# Patient Record
Sex: Male | Born: 1950 | Race: White | Hispanic: No | Marital: Married | Smoking: Never smoker
Health system: Southern US, Community
[De-identification: ages and names within clinical notes are randomized; demographics above are authoritative.]

## PROBLEM LIST (undated history)

## (undated) DIAGNOSIS — K760 Fatty (change of) liver, not elsewhere classified: Secondary | ICD-10-CM

## (undated) DIAGNOSIS — I10 Essential (primary) hypertension: Secondary | ICD-10-CM

## (undated) DIAGNOSIS — E785 Hyperlipidemia, unspecified: Secondary | ICD-10-CM

## (undated) DIAGNOSIS — E119 Type 2 diabetes mellitus without complications: Secondary | ICD-10-CM

## (undated) HISTORY — DX: Type 2 diabetes mellitus without complications: E11.9

## (undated) HISTORY — DX: Hyperlipidemia, unspecified: E78.5

## (undated) HISTORY — DX: Fatty (change of) liver, not elsewhere classified: K76.0

---

## 1999-04-17 ENCOUNTER — Ambulatory Visit (HOSPITAL_BASED_OUTPATIENT_CLINIC_OR_DEPARTMENT_OTHER): Admission: RE | Admit: 1999-04-17 | Discharge: 1999-04-17 | Payer: Self-pay | Admitting: *Deleted

## 1999-05-20 ENCOUNTER — Ambulatory Visit (HOSPITAL_COMMUNITY): Admission: RE | Admit: 1999-05-20 | Discharge: 1999-05-20 | Payer: Self-pay | Admitting: Neurosurgery

## 1999-05-20 ENCOUNTER — Encounter: Payer: Self-pay | Admitting: Neurosurgery

## 2000-08-25 ENCOUNTER — Emergency Department (HOSPITAL_COMMUNITY): Admission: EM | Admit: 2000-08-25 | Discharge: 2000-08-25 | Payer: Self-pay

## 2008-10-05 DIAGNOSIS — I10 Essential (primary) hypertension: Secondary | ICD-10-CM

## 2008-10-05 HISTORY — DX: Essential (primary) hypertension: I10

## 2012-04-01 ENCOUNTER — Encounter (HOSPITAL_COMMUNITY): Payer: Self-pay | Admitting: Emergency Medicine

## 2012-04-01 ENCOUNTER — Emergency Department (HOSPITAL_COMMUNITY)
Admission: EM | Admit: 2012-04-01 | Discharge: 2012-04-01 | Disposition: A | Discharge status: Home / Self Care | Payer: No Typology Code available for payment source | Attending: Emergency Medicine | Admitting: Emergency Medicine

## 2012-04-01 ENCOUNTER — Emergency Department (HOSPITAL_COMMUNITY): Payer: No Typology Code available for payment source

## 2012-04-01 DIAGNOSIS — S335XXA Sprain of ligaments of lumbar spine, initial encounter: Secondary | ICD-10-CM | POA: Insufficient documentation

## 2012-04-01 DIAGNOSIS — I10 Essential (primary) hypertension: Secondary | ICD-10-CM | POA: Insufficient documentation

## 2012-04-01 DIAGNOSIS — IMO0002 Reserved for concepts with insufficient information to code with codable children: Secondary | ICD-10-CM | POA: Insufficient documentation

## 2012-04-01 DIAGNOSIS — S39012A Strain of muscle, fascia and tendon of lower back, initial encounter: Secondary | ICD-10-CM

## 2012-04-01 DIAGNOSIS — S46911A Strain of unspecified muscle, fascia and tendon at shoulder and upper arm level, right arm, initial encounter: Secondary | ICD-10-CM

## 2012-04-01 DIAGNOSIS — Z79899 Other long term (current) drug therapy: Secondary | ICD-10-CM | POA: Insufficient documentation

## 2012-04-01 DIAGNOSIS — M25519 Pain in unspecified shoulder: Secondary | ICD-10-CM | POA: Insufficient documentation

## 2012-04-01 HISTORY — DX: Essential (primary) hypertension: I10

## 2012-04-01 NOTE — ED Notes (Signed)
Pt states lower back is "just stiff, like I slept too much." Pt concerned with right shoulder. Full ROM visualized. Pt c.o pain inc with movement.

## 2012-04-01 NOTE — ED Notes (Signed)
Pt d/c home in NAD. Pt voiced understanding of d/c instructions and follow up care.  

## 2012-04-01 NOTE — Discharge Instructions (Signed)
SEEK IMMEDIATE MEDICAL ATTENTION IF: You develop difficulties swallowing or breathing.  You have new or worse numbness, weakness, tingling, or movement problems in your arms or legs.  You develop increasing pain which is uncontrolled with medications.  You have change in bowel or bladder function, or other concerns.  SEEK IMMEDIATE MEDICAL ATTENTION IF: New numbness, tingling, weakness, or problem with the use of your arms or legs.  Severe back pain not relieved with medications.  Change in bowel or bladder control.  Increasing pain in any areas of the body (such as chest or abdominal pain).  Shortness of breath, dizziness or fainting.  Nausea (feeling sick to your stomach), vomiting, fever, or sweats.
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
## 2015-10-06 DIAGNOSIS — E785 Hyperlipidemia, unspecified: Secondary | ICD-10-CM

## 2015-10-06 DIAGNOSIS — E119 Type 2 diabetes mellitus without complications: Secondary | ICD-10-CM

## 2015-10-06 HISTORY — DX: Type 2 diabetes mellitus without complications: E11.9

## 2015-10-06 HISTORY — DX: Hyperlipidemia, unspecified: E78.5

## 2016-12-24 ENCOUNTER — Encounter: Payer: Self-pay | Admitting: Internal Medicine

## 2016-12-24 ENCOUNTER — Ambulatory Visit (INDEPENDENT_AMBULATORY_CARE_PROVIDER_SITE_OTHER): Payer: Medicare HMO | Admitting: Internal Medicine

## 2016-12-24 VITALS — BP 134/76 | HR 82 | Temp 98.6°F | Resp 18 | Ht 74.0 in | Wt 275.0 lb

## 2016-12-24 DIAGNOSIS — Z79899 Other long term (current) drug therapy: Secondary | ICD-10-CM

## 2016-12-24 DIAGNOSIS — E119 Type 2 diabetes mellitus without complications: Secondary | ICD-10-CM | POA: Diagnosis not present

## 2016-12-24 DIAGNOSIS — Z0001 Encounter for general adult medical examination with abnormal findings: Secondary | ICD-10-CM

## 2016-12-24 DIAGNOSIS — I10 Essential (primary) hypertension: Secondary | ICD-10-CM

## 2016-12-24 DIAGNOSIS — Z125 Encounter for screening for malignant neoplasm of prostate: Secondary | ICD-10-CM

## 2016-12-24 DIAGNOSIS — Z0000 Encounter for general adult medical examination without abnormal findings: Secondary | ICD-10-CM

## 2016-12-24 DIAGNOSIS — E559 Vitamin D deficiency, unspecified: Secondary | ICD-10-CM

## 2016-12-24 DIAGNOSIS — E785 Hyperlipidemia, unspecified: Secondary | ICD-10-CM

## 2016-12-24 LAB — HEPATIC FUNCTION PANEL
ALK PHOS: 49 U/L (ref 40–115)
ALT: 65 U/L — AB (ref 9–46)
AST: 42 U/L — AB (ref 10–35)
Albumin: 4.7 g/dL (ref 3.6–5.1)
BILIRUBIN DIRECT: 0.1 mg/dL (ref <=0.2)
BILIRUBIN TOTAL: 0.4 mg/dL (ref 0.2–1.2)
Indirect Bilirubin: 0.3 mg/dL (ref 0.2–1.2)
Total Protein: 7.7 g/dL (ref 6.1–8.1)

## 2016-12-24 LAB — LIPID PANEL
CHOL/HDL RATIO: 7.6 ratio — AB (ref <5.0)
Cholesterol: 243 mg/dL — ABNORMAL HIGH (ref <200)
HDL: 32 mg/dL — AB (ref >40)
LDL CALC: 138 mg/dL — AB (ref <100)
Triglycerides: 363 mg/dL — ABNORMAL HIGH (ref <150)
VLDL: 73 mg/dL — ABNORMAL HIGH (ref <30)

## 2016-12-24 LAB — CBC WITH DIFFERENTIAL/PLATELET
BASOS ABS: 78 {cells}/uL (ref 0–200)
BASOS PCT: 1 %
EOS ABS: 78 {cells}/uL (ref 15–500)
Eosinophils Relative: 1 %
HCT: 42.2 % (ref 38.5–50.0)
HEMOGLOBIN: 14.2 g/dL (ref 13.2–17.1)
LYMPHS ABS: 2106 {cells}/uL (ref 850–3900)
Lymphocytes Relative: 27 %
MCH: 31.8 pg (ref 27.0–33.0)
MCHC: 33.6 g/dL (ref 32.0–36.0)
MCV: 94.6 fL (ref 80.0–100.0)
MPV: 10.3 fL (ref 7.5–12.5)
Monocytes Absolute: 702 cells/uL (ref 200–950)
Monocytes Relative: 9 %
Neutro Abs: 4836 cells/uL (ref 1500–7800)
Neutrophils Relative %: 62 %
Platelets: 303 10*3/uL (ref 140–400)
RBC: 4.46 MIL/uL (ref 4.20–5.80)
RDW: 13 % (ref 11.0–15.0)
WBC: 7.8 10*3/uL (ref 3.8–10.8)

## 2016-12-24 LAB — BASIC METABOLIC PANEL WITH GFR
BUN: 17 mg/dL (ref 7–25)
CHLORIDE: 103 mmol/L (ref 98–110)
CO2: 22 mmol/L (ref 20–31)
Calcium: 10 mg/dL (ref 8.6–10.3)
Creat: 0.9 mg/dL (ref 0.70–1.25)
GFR, Est African American: >89 mL/min (ref >=60)
GFR, Est Non African American: 89 mL/min (ref >=60)
Glucose, Bld: 92 mg/dL (ref 65–99)
POTASSIUM: 4.6 mmol/L (ref 3.5–5.3)
SODIUM: 139 mmol/L (ref 135–146)

## 2016-12-24 LAB — HEMOGLOBIN A1C
Hgb A1c MFr Bld: 5.4 % (ref <5.7)
MEAN PLASMA GLUCOSE: 108 mg/dL

## 2016-12-24 LAB — TSH: TSH: 2.14 m[IU]/L (ref 0.40–4.50)

## 2016-12-24 LAB — PSA: PSA: 1.5 ng/mL (ref <=4.0)

## 2016-12-24 MED ORDER — LISINOPRIL-HYDROCHLOROTHIAZIDE 20-25 MG PO TABS: 1.0000 | ORAL_TABLET | Freq: Every day | ORAL | 0 refills | Status: DC

## 2016-12-24 MED ORDER — TRAZODONE HCL 100 MG PO TABS: 100.0000 mg | ORAL_TABLET | Freq: Every day | ORAL | 0 refills | Status: DC

## 2016-12-24 NOTE — Patient Instructions (Signed)
Please start taking trazodone 1/2 tablet about 45 minutes prior to bedtime.  If you are still not sleepy you can take the second half of the tablet when you get in bed.  Please continue your metformin.  Please stop the spironolocatone.  Please stop the lisinopril 20 mg.  Please start taking lisnopril HCTZ 20-25 mg tablet once daily in the morning.  Please stop the potassium supplement.  Please keep a close eye on your blood pressure as we are making a change to your medications.  Your goal range is 120-140/60-85.

## 2016-12-24 NOTE — Progress Notes (Signed)
Annual Screening Comprehensive Examination   This very nice 66 y.o.male presents for complete physical.  Patient has no major health issues.  Patient reports no complaints at this time.   Patient is currently retired.  He likes to do some work on the side to keep him busy.  He does try to stay busy keeping up with tasks.  He does operate heavy machinery.     He reports that he has been having some issues with sleeping.  He reports that his wife had a nervous breakdown and this has been keeping him awake.  He reports that he has been having to take ibuprofen and tylenol pm.  He gets some sleep with this.  He feels like he can't turn his brain off.  He has a hard time falling asleep and staying asleep.    He reports that he has a history of diabetes.  He is taking metformin with this.  He reports that initially his blood sugars were running around 500.   He reports that he had polyuria, polydipisia, and weight loss.  He does not have issues with tolerating the metformin.  He has done some weight loss as well.  He reports that he checks his blood sugars often.  He runs 94-108.  He is having his vision checked yearly. He goes to the [facility] eye doctor.    He has had some hypertension in the past.  He is taking spironolactone and also the lisinopril.  He reports that he is taking both of those together with ASA.  He has no chest pains, palpitations, leg swelling.    Finally, patient has history of Vitamin D Deficiency and last vitamin D was No results found for: VD25OH.  Currently on supplementation    Current Outpatient Prescriptions on File Prior to Visit  Medication Sig Dispense Refill  . lisinopril (PRINIVIL,ZESTRIL) 20 MG tablet Take 20 mg by mouth daily.     No current facility-administered medications on file prior to visit.     No Known Allergies  Past Medical History:  Diagnosis Date  . Hypertension      There is no immunization history on file for this patient.  No past  surgical history on file.  No family history on file.  Social History   Social History  . Marital status: Married    Spouse name: N/A  . Number of children: N/A  . Years of education: N/A   Occupational History  . Not on file.   Social History Main Topics  . Smoking status: Never Smoker  . Smokeless tobacco: Never Used  . Alcohol use Yes  . Drug use: No  . Sexual activity: Not on file   Other Topics Concern  . Not on file   Social History Narrative  . No narrative on file    Review of Systems  Constitutional: Negative for chills, fever and malaise/fatigue.  HENT: Negative for congestion, ear pain and sore throat.   Eyes: Negative.   Respiratory: Negative for cough, shortness of breath and wheezing.   Cardiovascular: Negative for chest pain, palpitations and leg swelling.  Gastrointestinal: Negative for abdominal pain, blood in stool, constipation, diarrhea, heartburn and melena.  Genitourinary: Negative.   Skin: Negative.   Neurological: Negative for dizziness, sensory change, loss of consciousness and headaches.  Psychiatric/Behavioral: Negative for depression. The patient is not nervous/anxious and does not have insomnia.       Physical Exam  BP 134/76   Pulse 82   Temp  98.6 F (37 C) (Temporal)   Resp 18   Ht 6\' 2"  (1.88 m)   Wt 275 lb (124.7 kg)   BMI 35.31 kg/m   General Appearance: Well nourished and in no apparent distress. Eyes: PERRLA, EOMs, conjunctiva no swelling or erythema, normal fundi and vessels. Sinuses: No frontal/maxillary tenderness ENT/Mouth: EACs patent / TMs  nl. Nares clear without erythema, swelling, mucoid exudates. Oral hygiene is good. No erythema, swelling, or exudate. Tongue normal, non-obstructing. Tonsils not swollen or erythematous. Hearing normal.  Neck: Supple, thyroid normal. No bruits, nodes or JVD. Respiratory: Respiratory effort normal.  BS equal and clear bilateral without rales, rhonci, wheezing or stridor. Cardio:  Heart sounds are normal with regular rate and rhythm and no murmurs, rubs or gallops. Peripheral pulses are normal and equal bilaterally without edema. No aortic or femoral bruits. Chest: symmetric with normal excursions and percussion. Abdomen: Flat, soft, with bowl sounds. Nontender, no guarding, rebound, hernias, masses, or organomegaly.  Lymphatics: Non tender without lymphadenopathy.  Musculoskeletal: Full ROM all peripheral extremities, joint stability, 5/5 strength, and normal gait. Skin: Warm and dry without rashes, lesions, cyanosis, clubbing or  ecchymosis.  Neuro: Cranial nerves intact, reflexes equal bilaterally. Normal muscle tone, no cerebellar symptoms. Sensation intact.  Pysch: Awake and oriented X 3, normal affect, Insight and Judgment appropriate.   Assessment and Plan   1. Encounter for general adult medical examination with abnormal findings -due next year -Repeat OV in 6 months  2. Type 2 diabetes mellitus without complication, without long-term current use of insulin (HCC) -cont diet and exercise -cont metformin -checks CBGs very regularly - Hemoglobin A1c - Insulin, random  3. Essential hypertension -cont meds -stop spironolaction  -stop lisinopril  -start lisinopril HCTZ for streamlining routine and there is concern for two potassium sparing medications together  -stop potassium supplement -monitor at  Home -cont exercise as tolerated - Urinalysis, Routine w reflex microscopic - Microalbumin / creatinine urine ratio - EKG 12-Lead - TSH  4. Vitamin D deficiency -cont Vit D - VITAMIN D 25 Hydroxy (Vit-D Deficiency, Fractures)  5. Hyperlipidemia LDL goal <70 -may need statin therapy to reach goal LDL - Lipid panel  6. Medication management  - CBC with Differential/Platelet - BASIC METABOLIC PANEL WITH GFR - Hepatic function panel - Magnesium  7. Screening for prostate cancer  - PSA  Have asked patient to obtain records from old office so we  may update the health maintenance profile here.  He will do so.      Continue prudent diet as discussed, weight control, regular exercise, and medications. Routine screening labs and tests as requested with regular follow-up as recommended.  Over 40 minutes of exam, counseling, chart review and critical decision making was performed

## 2016-12-25 LAB — URINALYSIS, MICROSCOPIC ONLY
Bacteria, UA: NONE SEEN [HPF]
CRYSTALS: NONE SEEN [HPF]
Casts: NONE SEEN [LPF]
RBC / HPF: NONE SEEN RBC/HPF (ref <=2)
Squamous Epithelial / LPF: NONE SEEN [HPF] (ref <=5)
Yeast: NONE SEEN [HPF]

## 2016-12-25 LAB — INSULIN, RANDOM: Insulin: 19.2 u[IU]/mL (ref 2.0–19.6)

## 2016-12-25 LAB — URINALYSIS, ROUTINE W REFLEX MICROSCOPIC
Bilirubin Urine: NEGATIVE
GLUCOSE, UA: NEGATIVE
Hgb urine dipstick: NEGATIVE
KETONES UR: NEGATIVE
Nitrite: NEGATIVE
PH: 5.5 (ref 5.0–8.0)
Protein, ur: NEGATIVE
SPECIFIC GRAVITY, URINE: 1.019 (ref 1.001–1.035)

## 2016-12-25 LAB — MAGNESIUM: Magnesium: 2 mg/dL (ref 1.5–2.5)

## 2016-12-25 LAB — MICROALBUMIN / CREATININE URINE RATIO
Creatinine, Urine: 118 mg/dL (ref 20–370)
Microalb Creat Ratio: 3 mcg/mg creat (ref <30)
Microalb, Ur: 0.4 mg/dL

## 2016-12-25 LAB — VITAMIN D 25 HYDROXY (VIT D DEFICIENCY, FRACTURES): VIT D 25 HYDROXY: 23 ng/mL — AB (ref 30–100)

## 2016-12-27 DIAGNOSIS — E1169 Type 2 diabetes mellitus with other specified complication: Secondary | ICD-10-CM | POA: Insufficient documentation

## 2016-12-27 DIAGNOSIS — E559 Vitamin D deficiency, unspecified: Secondary | ICD-10-CM | POA: Insufficient documentation

## 2016-12-27 DIAGNOSIS — E785 Hyperlipidemia, unspecified: Secondary | ICD-10-CM

## 2016-12-28 ENCOUNTER — Other Ambulatory Visit: Payer: Self-pay | Admitting: *Deleted

## 2016-12-29 ENCOUNTER — Other Ambulatory Visit: Payer: Self-pay | Admitting: *Deleted

## 2016-12-29 MED ORDER — METFORMIN HCL ER (MOD) 500 MG PO TB24: 1000.0000 mg | ORAL_TABLET | Freq: Every day | ORAL | 1 refills | Status: DC

## 2016-12-29 MED ORDER — LISINOPRIL-HYDROCHLOROTHIAZIDE 20-25 MG PO TABS: 1.0000 | ORAL_TABLET | Freq: Every day | ORAL | 1 refills | Status: DC

## 2017-01-05 ENCOUNTER — Other Ambulatory Visit: Payer: Self-pay | Admitting: *Deleted

## 2017-01-05 MED ORDER — METFORMIN HCL ER (MOD) 500 MG PO TB24: 1000.0000 mg | ORAL_TABLET | Freq: Every day | ORAL | 1 refills | Status: DC

## 2017-01-07 ENCOUNTER — Encounter: Payer: Self-pay | Admitting: *Deleted

## 2017-01-11 ENCOUNTER — Other Ambulatory Visit: Payer: Self-pay | Admitting: *Deleted

## 2017-01-11 MED ORDER — GLUCOSE BLOOD VI STRP: ORAL_STRIP | 12 refills | Status: DC

## 2017-01-11 MED ORDER — ACCU-CHEK AVIVA PLUS W/DEVICE KIT: PACK | 0 refills | Status: DC

## 2017-01-11 MED ORDER — PRAVASTATIN SODIUM 40 MG PO TABS: 40.0000 mg | ORAL_TABLET | Freq: Every evening | ORAL | 2 refills | Status: DC

## 2017-01-11 MED ORDER — ACCU-CHEK SOFTCLIX LANCETS MISC: 12 refills | Status: DC

## 2017-05-14 ENCOUNTER — Other Ambulatory Visit: Payer: Self-pay

## 2017-05-14 MED ORDER — LISINOPRIL-HYDROCHLOROTHIAZIDE 20-25 MG PO TABS: 1.0000 | ORAL_TABLET | Freq: Every day | ORAL | 1 refills | Status: DC

## 2017-05-14 MED ORDER — PRAVASTATIN SODIUM 40 MG PO TABS: 40.0000 mg | ORAL_TABLET | Freq: Every evening | ORAL | 2 refills | Status: DC

## 2017-05-24 ENCOUNTER — Other Ambulatory Visit: Payer: Self-pay | Admitting: Internal Medicine

## 2017-06-24 ENCOUNTER — Encounter: Payer: Self-pay | Admitting: Internal Medicine

## 2017-06-24 ENCOUNTER — Ambulatory Visit (INDEPENDENT_AMBULATORY_CARE_PROVIDER_SITE_OTHER): Payer: Medicare HMO | Admitting: Internal Medicine

## 2017-06-24 VITALS — BP 144/90 | HR 72 | Temp 97.8°F | Resp 18 | Ht 74.0 in | Wt 278.6 lb

## 2017-06-24 DIAGNOSIS — E559 Vitamin D deficiency, unspecified: Secondary | ICD-10-CM | POA: Diagnosis not present

## 2017-06-24 DIAGNOSIS — E119 Type 2 diabetes mellitus without complications: Secondary | ICD-10-CM

## 2017-06-24 DIAGNOSIS — Z23 Encounter for immunization: Secondary | ICD-10-CM

## 2017-06-24 DIAGNOSIS — E782 Mixed hyperlipidemia: Secondary | ICD-10-CM

## 2017-06-24 DIAGNOSIS — Z79899 Other long term (current) drug therapy: Secondary | ICD-10-CM

## 2017-06-24 DIAGNOSIS — I10 Essential (primary) hypertension: Secondary | ICD-10-CM

## 2017-06-24 DIAGNOSIS — G4700 Insomnia, unspecified: Secondary | ICD-10-CM | POA: Diagnosis not present

## 2017-06-24 MED ORDER — BISOPROLOL FUMARATE 10 MG PO TABS: ORAL_TABLET | ORAL | 1 refills | Status: DC

## 2017-06-24 MED ORDER — ALPRAZOLAM 1 MG PO TABS: ORAL_TABLET | ORAL | 3 refills | Status: DC

## 2017-06-24 NOTE — Patient Instructions (Signed)

## 2017-06-24 NOTE — Progress Notes (Signed)
This very nice 66 y.o. MWM presents for 6  month follow up with Hypertension, Hyperlipidemia, T2_DM and Vitamin D Deficiency.      Patient is treated for HTN & BP has been controlled at home. Today's BP is sl elevated at 144/ 90 and is rechecked at 134/84. Patient has had no complaints of any cardiac type chest pain, palpitations, dyspnea/orthopnea/PND, dizziness, claudication, or dependent edema.     Hyperlipidemia is not controlled with diet & he was started on Pravastatin in Mar 2018. Patient denies myalgias or other med SE's. Lab Results  Component Value Date   CHOL 176 06/24/2017   HDL 33 (L) 06/24/2017   LDLCALC 138 (H) 12/24/2016   TRIG 248 (H) 06/24/2017   CHOLHDL 5.3 (H) 06/24/2017      Also, the patient has history of T2_NIDDM treated initially with Insulin in June 2017 and then switched to Metformin. He denies symptoms of reactive hypoglycemia, diabetic polys, paresthesias or visual blurring.  Last A1c was at goal he was advised to stop Metformin: Lab Results  Component Value Date   HGBA1C 5.4 12/24/2016      Further, the patient also has history of Vitamin D Deficiency. Last vitamin D was very low and he started 1,000 units/daily : Lab Results  Component Value Date   VD25OH 23 (L) 12/24/2016   Current Outpatient Prescriptions on File Prior to Visit  Medication Sig  . TYLENOL PM 25-500 MG  Take 1 tablet by mouth at bedtime as needed.  . Ibuprofen 200 MG tablet Take 800 mg by mouth at bedtime as needed.  Marland Kitchen lisinopril-hctz 20-25 MG tablet Take 1 tablet by mouth daily.  . pravastatin 40 MG tablet Take 1 tablet (40 mg total) by mouth every evening.   No Known Allergies   PMHx:   Past Medical History:  Diagnosis Date  . Diabetes mellitus without complication ([place]) 9381  . Hyperlipidemia 2017  . Hypertension 2010   Immunization History  Administered Date(s) Administered  . Influenza, High Dose Seasonal PF 06/24/2017  . Tdap 10/14/2015   History reviewed. No  pertinent surgical history.   FHx:    Reviewed / unchanged  SHx:    Reviewed / unchanged  Systems Review:  Constitutional: Denies fever, chills, wt changes, headaches, insomnia, fatigue, night sweats, change in appetite. Eyes: Denies redness, blurred vision, diplopia, discharge, itchy, watery eyes.  ENT: Denies discharge, congestion, post nasal drip, epistaxis, sore throat, earache, hearing loss, dental pain, tinnitus, vertigo, sinus pain, snoring.  CV: Denies chest pain, palpitations, irregular heartbeat, syncope, dyspnea, diaphoresis, orthopnea, PND, claudication or edema. Respiratory: denies cough, dyspnea, DOE, pleurisy, hoarseness, laryngitis, wheezing.  Gastrointestinal: Denies dysphagia, odynophagia, heartburn, reflux, water brash, abdominal pain or cramps, nausea, vomiting, bloating, diarrhea, constipation, hematemesis, melena, hematochezia  or hemorrhoids. Genitourinary: Denies dysuria, frequency, urgency, nocturia, hesitancy, discharge, hematuria or flank pain. Musculoskeletal: Denies arthralgias, myalgias, stiffness, jt. swelling, pain, limping or strain/sprain.  Skin: Denies pruritus, rash, hives, warts, acne, eczema or change in skin lesion(s). Neuro: No weakness, tremor, incoordination, spasms, paresthesia or pain. Psychiatric: Denies confusion, memory loss or sensory loss. Endo: Denies change in weight, skin or hair change.  Heme/Lymph: No excessive bleeding, bruising or enlarged lymph nodes.  Physical Exam  BP  144/90 -->> 134/84   P 72   T 97.8 F    R 18   Ht 6\' 2"     Wt 278 lb 9.6 oz   BMI 35.77   Appears over nourished, well groomed  and in no distress.  Eyes: PERRLA, EOMs, conjunctiva no swelling or erythema. Sinuses: No frontal/maxillary tenderness ENT/Mouth: EAC's clear, TM's nl w/o erythema, bulging. Nares clear w/o erythema, swelling, exudates. Oropharynx clear without erythema or exudates. Oral hygiene is good. Tongue normal, non obstructing. Hearing  intact.  Neck: Supple. Thyroid nl. Car 2+/2+ without bruits, nodes or JVD. Chest: Respirations nl with BS clear & equal w/o rales, rhonchi, wheezing or stridor.  Cor: Heart sounds normal w/ regular rate and rhythm without sig. murmurs, gallops, clicks or rubs. Peripheral pulses normal and equal  without edema.  Abdomen: Soft, rotund & bowel sounds normal. Non-tender w/o guarding, rebound, hernias, masses or organomegaly.  Lymphatics: Unremarkable.  Musculoskeletal: Full ROM all peripheral extremities, joint stability, 5/5 strength and normal gait.  Skin: Warm, dry without exposed rashes, lesions or ecchymosis apparent.  Neuro: Cranial nerves intact, reflexes equal bilaterally. Sensory-motor testing grossly intact. Tendon reflexes grossly intact.  Pysch: Alert & oriented x 3.  Insight and judgement nl & appropriate. No ideations.  Assessment and Plan:  1. Essential hypertension  - Continue medication, monitor blood pressure at home.  - Continue DASH diet. Reminder to go to the ER if any CP,  SOB, nausea, dizziness, severe HA, changes vision/speech.  - CBC with Differential/Platelet - BASIC METABOLIC PANEL WITH GFR - Magnesium - TSH  2. Hyperlipidemia, mixed  - Continue diet/meds, exercise,& lifestyle modifications.  - Continue monitor periodic cholesterol/liver & renal functions   - Hepatic function panel - Lipid panel - TSH  3. Type 2 diabetes mellitus   (HCC)  - Continue diet, exercise, lifestyle modifications.  - Monitor appropriate labs.  - Hemoglobin A1c - Insulin, fasting  4. Vitamin D deficiency  - Continue supplementation.  - VITAMIN D 25 Hydroxy   5. Medication management  - CBC with Differential/Platelet - BASIC METABOLIC PANEL WITH GFR - Hepatic function panel - Magnesium - Lipid panel - TSH - Hemoglobin A1c - Insulin, fasting - VITAMIN D 25 Hydroxy  6. Need for immunization against influenza  - Flu vaccine HIGH DOSE PF (Fluzone High  dose)          Discussed  regular exercise, BP monitoring, weight control to achieve/maintain BMI less than 25 and discussed med and SE's. Recommended labs to assess and monitor clinical status with further disposition pending results of labs. Over 30 minutes of exam, counseling, chart review was performed.

## 2017-06-25 ENCOUNTER — Other Ambulatory Visit: Payer: Self-pay | Admitting: Internal Medicine

## 2017-06-25 LAB — LIPID PANEL
CHOL/HDL RATIO: 5.3 (calc) — AB (ref <5.0)
Cholesterol: 176 mg/dL (ref <200)
HDL: 33 mg/dL — AB (ref >40)
LDL CHOLESTEROL (CALC): 108 mg/dL — AB
Non-HDL Cholesterol (Calc): 143 mg/dL (calc) — ABNORMAL HIGH (ref <130)
Triglycerides: 248 mg/dL — ABNORMAL HIGH (ref <150)

## 2017-06-25 LAB — HEMOGLOBIN A1C
HEMOGLOBIN A1C: 5.9 %{Hb} — AB (ref <5.7)
Mean Plasma Glucose: 123 (calc)
eAG (mmol/L): 6.8 (calc)

## 2017-06-25 LAB — CBC WITH DIFFERENTIAL/PLATELET
BASOS ABS: 111 {cells}/uL (ref 0–200)
Basophils Relative: 1.5 %
Eosinophils Absolute: 52 cells/uL (ref 15–500)
Eosinophils Relative: 0.7 %
HEMATOCRIT: 42.6 % (ref 38.5–50.0)
HEMOGLOBIN: 14.5 g/dL (ref 13.2–17.1)
LYMPHS ABS: 1983 {cells}/uL (ref 850–3900)
MCH: 31.5 pg (ref 27.0–33.0)
MCHC: 34 g/dL (ref 32.0–36.0)
MCV: 92.4 fL (ref 80.0–100.0)
MPV: 10.4 fL (ref 7.5–12.5)
Monocytes Relative: 9.3 %
NEUTROS ABS: 4566 {cells}/uL (ref 1500–7800)
Neutrophils Relative %: 61.7 %
Platelets: 287 10*3/uL (ref 140–400)
RBC: 4.61 10*6/uL (ref 4.20–5.80)
RDW: 12.3 % (ref 11.0–15.0)
Total Lymphocyte: 26.8 %
WBC mixed population: 688 cells/uL (ref 200–950)
WBC: 7.4 10*3/uL (ref 3.8–10.8)

## 2017-06-25 LAB — BASIC METABOLIC PANEL WITH GFR
BUN: 14 mg/dL (ref 7–25)
CO2: 25 mmol/L (ref 20–32)
CREATININE: 0.92 mg/dL (ref 0.70–1.25)
Calcium: 10 mg/dL (ref 8.6–10.3)
Chloride: 101 mmol/L (ref 98–110)
GFR, EST NON AFRICAN AMERICAN: 87 mL/min/{1.73_m2} (ref >=60)
GFR, Est African American: 101 mL/min/{1.73_m2} (ref >=60)
Glucose, Bld: 91 mg/dL (ref 65–99)
Potassium: 4.3 mmol/L (ref 3.5–5.3)
SODIUM: 135 mmol/L (ref 135–146)

## 2017-06-25 LAB — HEPATIC FUNCTION PANEL
AG RATIO: 1.5 (calc) (ref 1.0–2.5)
ALKALINE PHOSPHATASE (APISO): 56 U/L (ref 40–115)
ALT: 75 U/L — AB (ref 9–46)
AST: 50 U/L — ABNORMAL HIGH (ref 10–35)
Albumin: 4.7 g/dL (ref 3.6–5.1)
Bilirubin, Direct: 0.2 mg/dL (ref 0.0–0.2)
Globulin: 3.2 g/dL (calc) (ref 1.9–3.7)
Indirect Bilirubin: 0.5 mg/dL (calc) (ref 0.2–1.2)
Total Bilirubin: 0.7 mg/dL (ref 0.2–1.2)
Total Protein: 7.9 g/dL (ref 6.1–8.1)

## 2017-06-25 LAB — TSH: TSH: 1.25 mIU/L (ref 0.40–4.50)

## 2017-06-25 LAB — INSULIN, RANDOM: INSULIN: 12.3 u[IU]/mL (ref 2.0–19.6)

## 2017-06-25 LAB — VITAMIN D 25 HYDROXY (VIT D DEFICIENCY, FRACTURES): VIT D 25 HYDROXY: 32 ng/mL (ref 30–100)

## 2017-06-25 LAB — MAGNESIUM: Magnesium: 2 mg/dL (ref 1.5–2.5)

## 2017-06-25 MED ORDER — METFORMIN HCL ER 500 MG PO TB24: ORAL_TABLET | ORAL | 1 refills | Status: DC

## 2017-06-28 ENCOUNTER — Other Ambulatory Visit: Payer: Self-pay | Admitting: *Deleted

## 2017-06-28 MED ORDER — METFORMIN HCL ER 500 MG PO TB24: ORAL_TABLET | ORAL | 1 refills | Status: DC

## 2017-08-10 ENCOUNTER — Other Ambulatory Visit: Payer: Self-pay | Admitting: *Deleted

## 2017-08-10 DIAGNOSIS — G4700 Insomnia, unspecified: Secondary | ICD-10-CM

## 2017-08-10 MED ORDER — ALPRAZOLAM 1 MG PO TABS: ORAL_TABLET | ORAL | 1 refills | Status: DC

## 2017-08-10 MED ORDER — LISINOPRIL-HYDROCHLOROTHIAZIDE 20-25 MG PO TABS: 1.0000 | ORAL_TABLET | Freq: Every day | ORAL | 1 refills | Status: DC

## 2017-09-29 NOTE — Progress Notes (Signed)
MEDICARE ANNUAL WELLNESS VISIT AND FOLLOW UP Assessment:   Essential hypertension - continue medications, DASH diet, exercise and monitor at home. Call if greater than 130/80.  -     lisinopril-hydrochlorothiazide (PRINZIDE,ZESTORETIC) 20-25 MG tablet; Take 1 tablet by mouth daily. -     CBC with Differential/Platelet -     BASIC METABOLIC PANEL WITH GFR -     Hepatic function panel -     TSH  Prediabetes Discussed general issues about diabetes pathophysiology and management., Educational material distributed., Suggested low cholesterol diet., Encouraged aerobic exercise., Discussed foot care., Reminded to get yearly retinal exam. -     Hemoglobin A1c  Hyperlipidemia LDL goal <70 -continue medications, check lipids, decrease fatty foods, increase activity.  -     pravastatin (PRAVACHOL) 40 MG tablet; Take 1 tablet (40 mg total) by mouth every evening. -     Lipid panel  Vitamin D deficiency Continue supplement  Insomnia, unspecified type Insomnia- good sleep hygiene discussed, increase day time activity, try melatonin or benadryl if this does not help we will call in sleep medication.   Medication management -     Magnesium  Encounter for Medicare annual wellness exam 1 year Out of prevnar in the office Colonoscopy- patient declines a colonoscopy even though the risks and benefits were discussed at length. Colon cancer is 3rd most diagnosed cancer and 2nd leading cause of death in both men and women 51 years of age and older. Patient understands the risk of cancer and death with declining the test however they are willing to do cologuard screening instead. They understand that this is not as sensitive or specific as a colonoscopy and they are still recommended to get a colonoscopy. The cologuard will be sent out to their house.   Advanced care planning/counseling discussion Discussed with patient  Morbid obesity ([place]) - follow up 3 months for progress monitoring - increase  veggies, decrease carbs - long discussion about weight loss, diet, and exercise  Acute non-recurrent maxillary sinusitis -     levofloxacin (LEVAQUIN) 500 MG tablet; Take 1 tablet (500 mg total) by mouth daily. -     predniSONE (DELTASONE) 20 MG tablet; 2 tablets daily for 3 days, 1 tablet daily for 4 days. - nasocort samples given - STOP OTC NASAL DECONGESTANT   Over 30 minutes of exam, counseling, chart review, and critical decision making was performed  Future Appointments  Date Time Provider [place]  01/10/2018  9:30 AM Unk Pinto, MD [facility] None     Plan:   During the course of the visit the patient was educated and counseled about appropriate screening and preventive services including:    Pneumococcal vaccine   Influenza vaccine  Prevnar 13  Td vaccine  Screening electrocardiogram  Colorectal cancer screening  Diabetes screening  Glaucoma screening  Nutrition counseling    Subjective:  Roger Mcclain is a 66 y.o. male who presents for Medicare Annual Wellness Visit and 3 month follow up for HTN, hyperlipidemia, prediabetes, and vitamin D Def.   He has been having sinus pain, bleeding, he is on vicks 12 hours nasal spray which is a decongestant x 1 month.   His blood pressure has been controlled at home, today their BP is BP: 138/86 He does workout, goes to gym 2 x a week for 1 hour. He denies chest pain, shortness of breath, dizziness.  He is on cholesterol medication and denies myalgias. His cholesterol is not at goal. The cholesterol last visit was:  Lab Results  Component Value Date   CHOL 176 06/24/2017   HDL 33 (L) 06/24/2017   LDLCALC 138 (H) 12/24/2016   TRIG 248 (H) 06/24/2017   CHOLHDL 5.3 (H) 06/24/2017   He has been working on diet and exercise for prediabetes, HAS HISTORY OF DM 2-3 YEARS AGO BUT WITH WEIGHT LOSS NOT IN DM RANGE, he is on metformin, and denies paresthesia of the feet, polydipsia, polyuria and visual  disturbances. Last A1C in the office was:  Lab Results  Component Value Date   HGBA1C 5.9 (H) 06/24/2017   Last GFR Lab Results  Component Value Date   GFRNONAA 87 06/24/2017     Lab Results  Component Value Date   GFRAA 101 06/24/2017   Patient is on Vitamin D supplement.   Lab Results  Component Value Date   VD25OH 32 06/24/2017     BMI is Body mass index is 35.41 kg/m., he is working on diet and exercise. Wt Readings from Last 3 Encounters:  10/01/17 275 lb 12.8 oz (125.1 kg)  06/24/17 278 lb 9.6 oz (126.4 kg)  12/24/16 275 lb (124.7 kg)    Medication Review: Current Outpatient Medications on File Prior to Visit  Medication Sig Dispense Refill  . ACCU-CHEK SOFTCLIX LANCETS lancets Use once daily with Accu-Chek meter to check BS reading. Dx. E11.9 100 each 12  . ALPRAZolam (XANAX) 1 MG tablet Take 1/2 to 1 tablet at hour of sleep 90 tablet 1  . Blood Glucose Monitoring Suppl (ACCU-CHEK AVIVA PLUS) w/Device KIT Use daily to check BS.  Dx. E11.9 1 kit 0  . Cholecalciferol (VITAMIN D PO) Take 1,000 Units by mouth daily.    . diphenhydramine-acetaminophen (TYLENOL PM) 25-500 MG TABS tablet Take 1 tablet by mouth at bedtime as needed.    Marland Kitchen glucose blood test strip Use once daily with Accu-Chek meter to check BS. Dx. E11.9 100 each 12  . ibuprofen (ADVIL,MOTRIN) 200 MG tablet Take 800 mg by mouth at bedtime as needed.    . metFORMIN (GLUCOPHAGE-XR) 500 MG 24 hr tablet Take 1 to 2 tablets 2 x / day with meals for Diabetes 360 tablet 1  . Prenatal Vit-Fe Fumarate-FA (M-VIT PO) Take 1 tablet by mouth daily.     No current facility-administered medications on file prior to visit.     Allergies: No Known Allergies  Current Problems (verified) has Diabetes mellitus ([place]); Hypertension; Hyperlipidemia LDL goal <70; Vitamin D deficiency; and Morbid obesity ([place]) on their problem list.  Screening Tests Immunization History  Administered Date(s) Administered  . Influenza, High  Dose Seasonal PF 06/24/2017  . Tdap 10/14/2015    Preventative care: Last colonoscopy: WILL DO COLOGUARD CT head 2001  Prior vaccinations: TD or Tdap: 2017  Influenza: 2018  Pneumococcal: N/A Prevnar13: OUT OF IN THE OFFICE Shingles/Zostavax: N/A  Names of Other Physician/Practitioners you currently use: 1. [facility] here for primary care 2. [facility], eye doctor, last visit 2018 Patient Care Team: Unk Pinto, MD as PCP - General (Internal Medicine)  Surgical: He  has no past surgical history on file. Family His family history includes Diabetes in his father; Heart disease in his mother; Hyperlipidemia in his father; Hypertension in his father and mother. Social history  He reports that  has never smoked. he has never used smokeless tobacco. He reports that he drinks alcohol. He reports that he does not use drugs.  MEDICARE WELLNESS OBJECTIVES: Physical activity: Current Exercise Habits: Structured  exercise class, Type of exercise: walking;strength training/weights, Time (Minutes): 60, Frequency (Times/Week): 2, Weekly Exercise (Minutes/Week): 120, Intensity: Mild Cardiac risk factors: Cardiac Risk Factors include: advanced age (>73mn, >>2women);diabetes mellitus;dyslipidemia;hypertension;male gender;obesity (BMI >30kg/m2) Depression/mood screen:   [facility] 10/01/2017  Decreased Interest 0  Down, Depressed, Hopeless 0  PHQ - 2 Score 0    ADLs:  In your present state of health, do you have any difficulty performing the following activities: 10/01/2017 06/24/2017  Hearing? N N  Vision? N N  Difficulty concentrating or making decisions? N N  Walking or climbing stairs? N N  Dressing or bathing? N N  Doing errands, shopping? N N  Some recent data might be hidden     Cognitive Testing  Alert? Yes  Normal Appearance?Yes  Oriented to person? Yes  Place? Yes   Time? Yes  Recall of three objects?   Yes  Can perform simple calculations? Yes  Displays appropriate judgment?Yes  Can read the correct time from a watch face?Yes  EOL planning: Does Patient Have a Medical Advance Directive?: Yes Type of Advance Directive: Living will Does patient want to make changes to medical advance directive?: No - Patient declined   Objective:   Today's Vitals   10/01/17 0857  BP: 138/86  Pulse: 69  Temp: (!) 97.5 F (36.4 C)  SpO2: 97%  Weight: 275 lb 12.8 oz (125.1 kg)  Height: '6\' 2"'$  (1.88 m)  PainSc: 0-No pain   Body mass index is 35.41 kg/m.  General appearance: alert, no distress, WD/WN, male HEENT: normocephalic, sclerae anicteric, TMs pearly, nares patent, no discharge or erythema, pharynx normal Oral cavity: MMM, no lesions Neck: supple, no lymphadenopathy, no thyromegaly, no masses Heart: RRR, normal S1, S2, no murmurs Lungs: CTA bilaterally, no wheezes, rhonchi, or rales Abdomen: +bs, soft, non tender, non distended, no masses, no hepatomegaly, no splenomegaly Musculoskeletal: nontender, no swelling, no obvious deformity Extremities: no edema, no cyanosis, no clubbing Pulses: 2+ symmetric, upper and lower extremities, normal cap refill Neurological: alert, oriented x 3, CN2-12 intact, strength normal upper extremities and lower extremities, sensation normal throughout, DTRs 2+ throughout, no cerebellar signs, gait normal Psychiatric: normal affect, behavior normal, pleasant   Medicare Attestation I have personally reviewed: The patient's medical and social history Their use of alcohol, tobacco or illicit drugs Their current medications and supplements The patient's functional ability including ADLs,fall risks, home safety risks, cognitive, and hearing and visual impairment Diet and physical activities Evidence for depression or mood disorders  The patient's weight, height, BMI, and visual acuity have been recorded in the chart.  I have made referrals, counseling, and  provided education to the patient based on review of the above and I have provided the patient with a written personalized care plan for preventive services.     AVicie Mutters PA-C   10/01/2017

## 2017-10-01 ENCOUNTER — Ambulatory Visit: Payer: Medicare HMO | Admitting: Physician Assistant

## 2017-10-01 ENCOUNTER — Encounter: Payer: Self-pay | Admitting: Physician Assistant

## 2017-10-01 VITALS — BP 138/86 | HR 69 | Temp 97.5°F | Ht 74.0 in | Wt 275.8 lb

## 2017-10-01 DIAGNOSIS — Z0001 Encounter for general adult medical examination with abnormal findings: Secondary | ICD-10-CM | POA: Diagnosis not present

## 2017-10-01 DIAGNOSIS — R7303 Prediabetes: Secondary | ICD-10-CM

## 2017-10-01 DIAGNOSIS — E559 Vitamin D deficiency, unspecified: Secondary | ICD-10-CM | POA: Diagnosis not present

## 2017-10-01 DIAGNOSIS — Z7189 Other specified counseling: Secondary | ICD-10-CM

## 2017-10-01 DIAGNOSIS — R6889 Other general symptoms and signs: Secondary | ICD-10-CM | POA: Diagnosis not present

## 2017-10-01 DIAGNOSIS — Z79899 Other long term (current) drug therapy: Secondary | ICD-10-CM

## 2017-10-01 DIAGNOSIS — I10 Essential (primary) hypertension: Secondary | ICD-10-CM | POA: Diagnosis not present

## 2017-10-01 DIAGNOSIS — G4700 Insomnia, unspecified: Secondary | ICD-10-CM | POA: Diagnosis not present

## 2017-10-01 DIAGNOSIS — E785 Hyperlipidemia, unspecified: Secondary | ICD-10-CM

## 2017-10-01 DIAGNOSIS — J0100 Acute maxillary sinusitis, unspecified: Secondary | ICD-10-CM | POA: Diagnosis not present

## 2017-10-01 DIAGNOSIS — Z0000 Encounter for general adult medical examination without abnormal findings: Secondary | ICD-10-CM

## 2017-10-01 MED ORDER — LISINOPRIL-HYDROCHLOROTHIAZIDE 20-25 MG PO TABS: 1.0000 | ORAL_TABLET | Freq: Every day | ORAL | 1 refills | Status: DC

## 2017-10-01 MED ORDER — PRAVASTATIN SODIUM 40 MG PO TABS: 40.0000 mg | ORAL_TABLET | Freq: Every evening | ORAL | 2 refills | Status: DC

## 2017-10-01 MED ORDER — PREDNISONE 20 MG PO TABS: ORAL_TABLET | ORAL | 0 refills | Status: DC

## 2017-10-01 MED ORDER — LEVOFLOXACIN 500 MG PO TABS: 500.0000 mg | ORAL_TABLET | Freq: Every day | ORAL | 0 refills | Status: DC

## 2017-10-01 NOTE — Patient Instructions (Addendum)
Make sure you are on an allergy pill, see below for more details. Please take the prednisone as directed below, this is NOT an antibiotic so you do NOT have to finish it. You can take it for a few days and stop it if you are doing better.   Please take the prednisone to help decrease inflammation and therefore decrease symptoms. Take it it with food to avoid GI upset. It can cause increased energy but on the other hand it can make it hard to sleep at night so please take it AT [place], it takes 8-12 hours to start working so it will NOT affect your sleeping if you take it at night with your food!!  If you are diabetic it will increase your sugars so decrease carbs and monitor your sugars closely.      Your ears and sinuses are connected by the eustachian tube. When your sinuses are inflamed, this can close off the tube and cause fluid to collect in your middle ear. This can then cause dizziness, popping, clicking, ringing, and echoing in your ears. This is often NOT an infection and does NOT require antibiotics, it is caused by inflammation so the treatments help the inflammation. This can take a long time to get better so please be patient.  Here are things you can do to help with this:  - Try the Flonase or Nasonex. Remember to spray each nostril twice towards the outer part of your eye.  Do not sniff but instead pinch your nose and tilt your head back to help the medicine get into your sinuses.  The best time to do this is at bedtime.Stop if you get blurred vision or nose bleeds.   -While drinking fluids, pinch and hold nose close and swallow, to help open eustachian tubes to drain fluid behind ear drums.  -Please pick one of the over the counter allergy medications below and take it once daily for allergies.  It will also help with fluid behind ear drums. Claritin or loratadine cheapest but likely the weakest  Zyrtec or certizine at night because it can make you sleepy The strongest is  allegra or fexafinadine  Cheapest at [employer], [employer], [employer].   STOP THE OVER THE COUNTER NASAL SPRAY, THIS IS ADDICTIVE, ONLY TAKE FOR 3 DAYS AT A TIME  if worsening HA, changes vision/speech, imbalance, weakness go to the ER  Cologuard is an easy to use noninvasive colon cancer screening test based on the latest advances in stool DNA science.   Colon cancer is 3rd most diagnosed cancer and 2nd leading cause of death in both men and women 49 years of age and older despite being one of the most preventable and treatable cancers if found early.  4 of out 5 people diagnosed with colon cancer have NO prior family history.  When caught EARLY 90% of colon cancer is curable.   You have agreed to do a Cologuard screening and have declined a colonoscopy in spite of being explained the risks and benefits of the colonoscopy in detail, including cancer and death. Please understand that this is test not as sensitive or specific as a colonoscopy and you are still recommended to get a colonoscopy.   If you are NOT medicare please call your insurance company and give them these items to see if they will cover it: 1) CPT code, [id number] 2) Provider is Probation officer 3) [employer] NPI [id number] 4) [place] Tax ID #[id number]  Out-of-pocket cost  for Cologuard can range from $0 - $649 so please call  You will receive a short call from [place] support center at [employer], when you receive a call they will say they are from [place],  to confirm your mailing address and give you more information.  When they calll you, it will appear on the caller ID as "[employer]" or in some cases only this number will appear, [phone].   [employer] will ship your collection kit directly to you. You will collect a single stool sample in the privacy of your own home, no special preparation required. You will return the kit via [place] pre-paid shipping or  pick-up, in the same box it arrived in. Then I will contact you to discuss your results after I receive them from the laboratory.   If you have any questions or concerns, Cologuard Customer Support Specialist are available 24 hours a day, 7 days a week at [phone] or go to [email or web address].

## 2017-10-02 LAB — HEPATIC FUNCTION PANEL
AG Ratio: 1.7 (calc) (ref 1.0–2.5)
ALBUMIN MSPROF: 4.8 g/dL (ref 3.6–5.1)
ALT: 59 U/L — AB (ref 9–46)
AST: 40 U/L — ABNORMAL HIGH (ref 10–35)
Alkaline phosphatase (APISO): 56 U/L (ref 40–115)
BILIRUBIN INDIRECT: 0.5 mg/dL (ref 0.2–1.2)
Bilirubin, Direct: 0.1 mg/dL (ref 0.0–0.2)
GLOBULIN: 2.9 g/dL (ref 1.9–3.7)
TOTAL PROTEIN: 7.7 g/dL (ref 6.1–8.1)
Total Bilirubin: 0.6 mg/dL (ref 0.2–1.2)

## 2017-10-02 LAB — CBC WITH DIFFERENTIAL/PLATELET
BASOS PCT: 1.2 %
Basophils Absolute: 86 cells/uL (ref 0–200)
Eosinophils Absolute: 72 cells/uL (ref 15–500)
Eosinophils Relative: 1 %
HCT: 44.5 % (ref 38.5–50.0)
HEMOGLOBIN: 15.2 g/dL (ref 13.2–17.1)
Lymphs Abs: 2002 cells/uL (ref 850–3900)
MCH: 31.7 pg (ref 27.0–33.0)
MCHC: 34.2 g/dL (ref 32.0–36.0)
MCV: 92.9 fL (ref 80.0–100.0)
MONOS PCT: 11.1 %
MPV: 10.5 fL (ref 7.5–12.5)
NEUTROS ABS: 4241 {cells}/uL (ref 1500–7800)
Neutrophils Relative %: 58.9 %
PLATELETS: 286 10*3/uL (ref 140–400)
RBC: 4.79 10*6/uL (ref 4.20–5.80)
RDW: 11.8 % (ref 11.0–15.0)
TOTAL LYMPHOCYTE: 27.8 %
WBC mixed population: 799 cells/uL (ref 200–950)
WBC: 7.2 10*3/uL (ref 3.8–10.8)

## 2017-10-02 LAB — BASIC METABOLIC PANEL WITH GFR
BUN: 16 mg/dL (ref 7–25)
CALCIUM: 10.1 mg/dL (ref 8.6–10.3)
CHLORIDE: 100 mmol/L (ref 98–110)
CO2: 27 mmol/L (ref 20–32)
CREATININE: 0.99 mg/dL (ref 0.70–1.25)
GFR, Est African American: 92 mL/min/{1.73_m2} (ref >=60)
GFR, Est Non African American: 80 mL/min/{1.73_m2} (ref >=60)
GLUCOSE: 81 mg/dL (ref 65–99)
Potassium: 5.1 mmol/L (ref 3.5–5.3)
Sodium: 138 mmol/L (ref 135–146)

## 2017-10-02 LAB — HEMOGLOBIN A1C
EAG (MMOL/L): 6.6 (calc)
Hgb A1c MFr Bld: 5.8 % of total Hgb — ABNORMAL HIGH (ref <5.7)
MEAN PLASMA GLUCOSE: 120 (calc)

## 2017-10-02 LAB — LIPID PANEL
CHOLESTEROL: 181 mg/dL (ref <200)
HDL: 36 mg/dL — AB (ref >40)
LDL CHOLESTEROL (CALC): 108 mg/dL — AB
Non-HDL Cholesterol (Calc): 145 mg/dL (calc) — ABNORMAL HIGH (ref <130)
TRIGLYCERIDES: 247 mg/dL — AB (ref <150)
Total CHOL/HDL Ratio: 5 (calc) — ABNORMAL HIGH (ref <5.0)

## 2017-10-02 LAB — TSH: TSH: 1.68 m[IU]/L (ref 0.40–4.50)

## 2017-10-02 LAB — MAGNESIUM: Magnesium: 2.1 mg/dL (ref 1.5–2.5)

## 2017-10-11 ENCOUNTER — Other Ambulatory Visit: Payer: Self-pay

## 2017-10-11 DIAGNOSIS — G4700 Insomnia, unspecified: Secondary | ICD-10-CM

## 2017-10-11 DIAGNOSIS — I10 Essential (primary) hypertension: Secondary | ICD-10-CM

## 2017-10-11 MED ORDER — ALPRAZOLAM 1 MG PO TABS: ORAL_TABLET | ORAL | 0 refills | Status: DC

## 2017-10-11 MED ORDER — LISINOPRIL-HYDROCHLOROTHIAZIDE 20-25 MG PO TABS: 1.0000 | ORAL_TABLET | Freq: Every day | ORAL | 1 refills | Status: DC

## 2017-10-27 ENCOUNTER — Other Ambulatory Visit: Payer: Self-pay | Admitting: Physician Assistant

## 2017-10-27 ENCOUNTER — Telehealth: Payer: Self-pay

## 2017-10-27 DIAGNOSIS — J0100 Acute maxillary sinusitis, unspecified: Secondary | ICD-10-CM

## 2017-10-27 MED ORDER — PREDNISONE 20 MG PO TABS: ORAL_TABLET | ORAL | 0 refills | Status: DC

## 2017-10-27 NOTE — Telephone Encounter (Signed)
Pt reports sore throat & congestion. Would like a Rx called into pharmacy. Prednisone has been called into pharmacy, get on an OTC cold, & allergy pill. Pt states he can not take an allergy pill but will get on a cold meds. Also if pt's sxs get any worse while out of town pt should go to the urgent care.

## 2017-12-07 ENCOUNTER — Telehealth: Payer: Self-pay

## 2017-12-07 NOTE — Telephone Encounter (Signed)
Spoke with pt this morning about completing Cologuard. Pt states he has not had time to complete this yet but he will when ever he can get the time. March 5th 2019 by DD

## 2017-12-10 ENCOUNTER — Other Ambulatory Visit: Payer: Self-pay

## 2017-12-24 ENCOUNTER — Other Ambulatory Visit: Payer: Self-pay | Admitting: Physician Assistant

## 2017-12-24 DIAGNOSIS — G4700 Insomnia, unspecified: Secondary | ICD-10-CM

## 2017-12-24 MED ORDER — ALPRAZOLAM 1 MG PO TABS: ORAL_TABLET | ORAL | 0 refills | Status: AC

## 2017-12-24 NOTE — Progress Notes (Signed)
Future Appointments  Date Time Provider [place]  01/10/2018  9:30 AM Unk Pinto, MD [facility] None

## 2018-01-09 NOTE — Patient Instructions (Signed)

## 2018-01-09 NOTE — Progress Notes (Signed)
This very nice 67 y.o. MWM presents for 3 month follow up with HTN, HLD, T2_NIDDM and Vitamin D Deficiency. He reports poor success for his insomnia with Alprazolam & concomitant Benadryl & requests an alternative.      Patient is treated for HTN & BP has been controlled at home. Today's BP: 116/82. Patient has had no complaints of any cardiac type chest pain, palpitations, dyspnea / orthopnea / PND, dizziness, claudication, or dependent edema.     Hyperlipidemia is controlled with diet & meds. Patient denies myalgias or other med SE's. Last Lipids were not at goal: Lab Results  Component Value Date   CHOL 181 10/01/2017   HDL 36 (L) 10/01/2017   LDLCALC 108 (H) 10/01/2017   TRIG 247 (H) 10/01/2017   CHOLHDL 5.0 (H) 10/01/2017      Also, the patient has history of T2_NIDDM dx'd initially in June 32017 and started on Insulin and subsequently switched to Metformin and has had no symptoms of reactive hypoglycemia, diabetic polys, paresthesias or visual blurring.   He reports FBG's range betw 90-105 mg%. Last A1c was at goal: Lab Results  Component Value Date   HGBA1C 5.8 (H) 10/01/2017      Further, the patient also has history of Vitamin D Deficiency ("23"in Mar 2018) and has not  Supplemented  vitamin D as recommended. Last vitamin D was still very low .   Lab Results  Component Value Date   VD25OH 32 06/24/2017   Current Outpatient Medications on File Prior to Visit  Medication Sig  . ACCU-CHEK SOFTCLIX LANCETS lancets Use once daily with Accu-Chek meter to check BS reading. Dx. E11.9  . ALPRAZolam (XANAX) 1 MG tablet Take 1/2 to 1 tablet at hour of sleep  . Blood Glucose Monitoring Suppl (ACCU-CHEK AVIVA PLUS) w/Device KIT Use daily to check BS.  Dx. E11.9  . Cholecalciferol (VITAMIN D PO) Take 1,000 Units by mouth daily.  . diphenhydramine-acetaminophen (TYLENOL PM) 25-500 MG TABS tablet Take 1 tablet by mouth at bedtime as needed.  Marland Kitchen glucose blood test strip Use once daily  with Accu-Chek meter to check BS. Dx. E11.9  . ibuprofen (ADVIL,MOTRIN) 200 MG tablet Take 800 mg by mouth at bedtime as needed.  Marland Kitchen lisinopril-hydrochlorothiazide (PRINZIDE,ZESTORETIC) 20-25 MG tablet Take 1 tablet by mouth daily.  . metFORMIN (GLUCOPHAGE-XR) 500 MG 24 hr tablet Take 1 to 2 tablets 2 x / day with meals for Diabetes  . Multiple Vitamin (MULTIVITAMIN) tablet Take 1 tablet by mouth daily.  . pravastatin (PRAVACHOL) 40 MG tablet Take 1 tablet (40 mg total) by mouth every evening.   No current facility-administered medications on file prior to visit.    No Known Allergies PMHx:   Past Medical History:  Diagnosis Date  . Diabetes mellitus without complication ([place]) 3875  . Hyperlipidemia 2017  . Hypertension 2010   Immunization History  Administered Date(s) Administered  . Influenza, High Dose Seasonal PF 06/24/2017  . Tdap 10/14/2015   No past surgical history on file.   FHx:    Reviewed / unchanged  SHx:    Reviewed / unchanged  Systems Review:  Constitutional: Denies fever, chills, wt changes, headaches, insomnia, fatigue, night sweats, change in appetite. Eyes: Denies redness, blurred vision, diplopia, discharge, itchy, watery eyes.  ENT: Denies discharge, congestion, post nasal drip, epistaxis, sore throat, earache, hearing loss, dental pain, tinnitus, vertigo, sinus pain, snoring.  CV: Denies chest pain, palpitations, irregular heartbeat, syncope, dyspnea, diaphoresis, orthopnea, PND, claudication  or edema. Respiratory: denies cough, dyspnea, DOE, pleurisy, hoarseness, laryngitis, wheezing.  Gastrointestinal: Denies dysphagia, odynophagia, heartburn, reflux, water brash, abdominal pain or cramps, nausea, vomiting, bloating, diarrhea, constipation, hematemesis, melena, hematochezia  or hemorrhoids. Genitourinary: Denies dysuria, frequency, urgency, nocturia, hesitancy, discharge, hematuria or flank pain. Musculoskeletal: Denies arthralgias, myalgias, stiffness, jt.  swelling, pain, limping or strain/sprain.  Skin: Denies pruritus, rash, hives, warts, acne, eczema or change in skin lesion(s). Neuro: No weakness, tremor, incoordination, spasms, paresthesia or pain. Psychiatric: Denies confusion, memory loss or sensory loss. Endo: Denies change in weight, skin or hair change.  Heme/Lymph: No excessive bleeding, bruising or enlarged lymph nodes.  Physical Exam  BP 116/82   Pulse 68   Temp (!) 97.5 F (36.4 C)   Resp 18   Ht '6\' 2"'$  (1.88 m)   Wt 275 lb 12.8 oz (125.1 kg)   BMI 35.41 kg/m   Appears  well nourished, well groomed  and in no distress.  Eyes: PERRLA, EOMs, conjunctiva no swelling or erythema. Sinuses: No frontal/maxillary tenderness ENT/Mouth: EAC's clear, TM's nl w/o erythema, bulging. Nares clear w/o erythema, swelling, exudates. Oropharynx clear without erythema or exudates. Oral hygiene is good. Tongue normal, non obstructing. Hearing intact.  Neck: Supple. Thyroid not palpable. Car 2+/2+ without bruits, nodes or JVD. Chest: Respirations nl with BS clear & equal w/o rales, rhonchi, wheezing or stridor.  Cor: Heart sounds normal w/ regular rate and rhythm without sig. murmurs, gallops, clicks or rubs. Peripheral pulses normal and equal  without edema.  Abdomen: Soft & bowel sounds normal. Non-tender w/o guarding, rebound, hernias, masses or organomegaly.  Lymphatics: Unremarkable.  Musculoskeletal: Full ROM all peripheral extremities, joint stability, 5/5 strength and normal gait.  Skin: Warm, dry without exposed rashes, lesions or ecchymosis apparent.  Neuro: Cranial nerves intact, reflexes equal bilaterally. Sensory-motor testing grossly intact. Tendon reflexes grossly intact.  Pysch: Alert & oriented x 3.  Insight and judgement nl & appropriate. No ideations.  Assessment and Plan:  1. Essential hypertension  - Continue medication, monitor blood pressure at home.  - Continue DASH diet.  Reminder to go to the ER if any CP,  SOB,  nausea, dizziness, severe HA, changes vision/speech.  - CBC with Differential/Platelet - BASIC METABOLIC PANEL WITH GFR - Magnesium - TSH  2. Hyperlipidemia, mixed  - Continue diet/meds, exercise,& lifestyle modifications.  - Continue monitor periodic cholesterol/liver & renal functions   - Hepatic function panel - Lipid panel - TSH  3. Type 2 diabetes mellitus with stage 2 chronic kidney disease, without long-term current use of insulin (HCC)  - Continue diet, exercise, lifestyle modifications.  - Monitor appropriate labs.  - Hemoglobin A1c - Insulin, random  4. Vitamin D deficiency  - Continue supplementation.  - VITAMIN D 25 Hydroxyl  5. Insomnia  - traZODone (DESYREL) 150 MG tablet; Take 1/2 to 1 tablet 1 hour before Sleep  Dispense: 90 tablet; Refill: 0  6. Medication management  - CBC with Differential/Platelet - BASIC METABOLIC PANEL WITH GFR - Hepatic function panel - Magnesium - Lipid panel - TSH - Hemoglobin A1c - Insulin, random - VITAMIN D 25 Hydroxyl           Discussed  regular exercise, BP monitoring, weight control to achieve/maintain BMI less than 25 and discussed med and SE's. Recommended labs to assess and monitor clinical status with further disposition pending results of labs. Over 30 minutes of exam, counseling, chart review was performed.

## 2018-01-10 ENCOUNTER — Encounter: Payer: Self-pay | Admitting: Internal Medicine

## 2018-01-10 ENCOUNTER — Ambulatory Visit (INDEPENDENT_AMBULATORY_CARE_PROVIDER_SITE_OTHER): Payer: Medicare HMO | Admitting: Internal Medicine

## 2018-01-10 VITALS — BP 116/82 | HR 68 | Temp 97.5°F | Resp 18 | Ht 74.0 in | Wt 275.8 lb

## 2018-01-10 DIAGNOSIS — E782 Mixed hyperlipidemia: Secondary | ICD-10-CM

## 2018-01-10 DIAGNOSIS — G4700 Insomnia, unspecified: Secondary | ICD-10-CM | POA: Diagnosis not present

## 2018-01-10 DIAGNOSIS — Z79899 Other long term (current) drug therapy: Secondary | ICD-10-CM | POA: Diagnosis not present

## 2018-01-10 DIAGNOSIS — E559 Vitamin D deficiency, unspecified: Secondary | ICD-10-CM

## 2018-01-10 DIAGNOSIS — N182 Chronic kidney disease, stage 2 (mild): Secondary | ICD-10-CM | POA: Diagnosis not present

## 2018-01-10 DIAGNOSIS — E1122 Type 2 diabetes mellitus with diabetic chronic kidney disease: Secondary | ICD-10-CM | POA: Diagnosis not present

## 2018-01-10 DIAGNOSIS — I10 Essential (primary) hypertension: Secondary | ICD-10-CM

## 2018-01-10 MED ORDER — TRAZODONE HCL 150 MG PO TABS: ORAL_TABLET | ORAL | 0 refills | Status: DC

## 2018-01-11 ENCOUNTER — Other Ambulatory Visit: Payer: Self-pay | Admitting: Internal Medicine

## 2018-01-11 LAB — LIPID PANEL
Cholesterol: 189 mg/dL (ref <200)
HDL: 33 mg/dL — AB (ref >40)
LDL CHOLESTEROL (CALC): 122 mg/dL — AB
NON-HDL CHOLESTEROL (CALC): 156 mg/dL — AB (ref <130)
TRIGLYCERIDES: 218 mg/dL — AB (ref <150)
Total CHOL/HDL Ratio: 5.7 (calc) — ABNORMAL HIGH (ref <5.0)

## 2018-01-11 LAB — HEMOGLOBIN A1C
EAG (MMOL/L): 6.6 (calc)
HEMOGLOBIN A1C: 5.8 %{Hb} — AB (ref <5.7)
MEAN PLASMA GLUCOSE: 120 (calc)

## 2018-01-11 LAB — BASIC METABOLIC PANEL WITH GFR
BUN: 14 mg/dL (ref 7–25)
CO2: 28 mmol/L (ref 20–32)
CREATININE: 0.87 mg/dL (ref 0.70–1.25)
Calcium: 10.1 mg/dL (ref 8.6–10.3)
Chloride: 101 mmol/L (ref 98–110)
GFR, Est African American: 104 mL/min/{1.73_m2} (ref >=60)
GFR, Est Non African American: 90 mL/min/{1.73_m2} (ref >=60)
Glucose, Bld: 90 mg/dL (ref 65–99)
Potassium: 4.4 mmol/L (ref 3.5–5.3)
SODIUM: 137 mmol/L (ref 135–146)

## 2018-01-11 LAB — CBC WITH DIFFERENTIAL/PLATELET
BASOS ABS: 72 {cells}/uL (ref 0–200)
Basophils Relative: 1.2 %
EOS ABS: 72 {cells}/uL (ref 15–500)
EOS PCT: 1.2 %
HEMATOCRIT: 42.9 % (ref 38.5–50.0)
Hemoglobin: 14.5 g/dL (ref 13.2–17.1)
LYMPHS ABS: 1566 {cells}/uL (ref 850–3900)
MCH: 31 pg (ref 27.0–33.0)
MCHC: 33.8 g/dL (ref 32.0–36.0)
MCV: 91.9 fL (ref 80.0–100.0)
MPV: 10.6 fL (ref 7.5–12.5)
Monocytes Relative: 12 %
NEUTROS ABS: 3570 {cells}/uL (ref 1500–7800)
Neutrophils Relative %: 59.5 %
Platelets: 288 10*3/uL (ref 140–400)
RBC: 4.67 10*6/uL (ref 4.20–5.80)
RDW: 12.4 % (ref 11.0–15.0)
Total Lymphocyte: 26.1 %
WBC mixed population: 720 cells/uL (ref 200–950)
WBC: 6 10*3/uL (ref 3.8–10.8)

## 2018-01-11 LAB — VITAMIN D 25 HYDROXY (VIT D DEFICIENCY, FRACTURES): Vit D, 25-Hydroxy: 33 ng/mL (ref 30–100)

## 2018-01-11 LAB — HEPATIC FUNCTION PANEL
AG Ratio: 1.8 (calc) (ref 1.0–2.5)
ALBUMIN MSPROF: 4.8 g/dL (ref 3.6–5.1)
ALKALINE PHOSPHATASE (APISO): 52 U/L (ref 40–115)
ALT: 52 U/L — AB (ref 9–46)
AST: 38 U/L — ABNORMAL HIGH (ref 10–35)
BILIRUBIN TOTAL: 0.6 mg/dL (ref 0.2–1.2)
Bilirubin, Direct: 0.2 mg/dL (ref 0.0–0.2)
Globulin: 2.7 g/dL (calc) (ref 1.9–3.7)
Indirect Bilirubin: 0.4 mg/dL (calc) (ref 0.2–1.2)
Total Protein: 7.5 g/dL (ref 6.1–8.1)

## 2018-01-11 LAB — MAGNESIUM: Magnesium: 2 mg/dL (ref 1.5–2.5)

## 2018-01-11 LAB — TSH: TSH: 2.53 mIU/L (ref 0.40–4.50)

## 2018-01-11 LAB — INSULIN, RANDOM: INSULIN: 12 u[IU]/mL (ref 2.0–19.6)

## 2018-01-11 MED ORDER — ROSUVASTATIN CALCIUM 40 MG PO TABS: ORAL_TABLET | ORAL | 3 refills | Status: DC

## 2018-01-12 ENCOUNTER — Telehealth: Payer: Self-pay | Admitting: *Deleted

## 2018-01-12 NOTE — Telephone Encounter (Signed)
Patient called and reported he took Trazodone 150.mg 1 tablet last PM and woke up in a cold sweat with a headache and nausea.  Per Dr Melford Aase, stop the medication for a few days and restart at 1/3 tablet(50 mg) at bedtime.

## 2018-01-27 ENCOUNTER — Other Ambulatory Visit: Payer: Self-pay | Admitting: Internal Medicine

## 2018-01-27 ENCOUNTER — Telehealth: Payer: Self-pay | Admitting: *Deleted

## 2018-01-27 ENCOUNTER — Other Ambulatory Visit: Payer: Self-pay | Admitting: *Deleted

## 2018-01-27 MED ORDER — PREDNISONE 20 MG PO TABS: ORAL_TABLET | ORAL | 0 refills | Status: DC

## 2018-01-27 NOTE — Telephone Encounter (Signed)
Patient was advised an RX for Prednisone has been sent to [facility] in [place], [place] for gout pain.

## 2018-02-22 ENCOUNTER — Other Ambulatory Visit: Payer: Self-pay | Admitting: Internal Medicine

## 2018-02-22 DIAGNOSIS — I10 Essential (primary) hypertension: Secondary | ICD-10-CM

## 2018-02-22 DIAGNOSIS — E119 Type 2 diabetes mellitus without complications: Secondary | ICD-10-CM

## 2018-02-22 MED ORDER — METFORMIN HCL ER 500 MG PO TB24: ORAL_TABLET | ORAL | 1 refills | Status: DC

## 2018-02-22 MED ORDER — LISINOPRIL-HYDROCHLOROTHIAZIDE 20-25 MG PO TABS: 1.0000 | ORAL_TABLET | Freq: Every day | ORAL | 1 refills | Status: DC

## 2018-03-11 ENCOUNTER — Telehealth: Payer: Self-pay | Admitting: *Deleted

## 2018-03-11 MED ORDER — GABAPENTIN 100 MG PO CAPS: ORAL_CAPSULE | ORAL | 0 refills | Status: DC

## 2018-03-11 NOTE — Telephone Encounter (Signed)
Patient called and reported Trazodone made him sick and he is requesting a refill on Alprazolam.  Per Dr Melford Aase, he does suggest he return to Alprazolam and an RX for Gabapentin 100 mg 1-2 at bedtime sent to his pharmacy. Patient is aware.

## 2018-04-17 ENCOUNTER — Encounter: Payer: Self-pay | Admitting: Internal Medicine

## 2018-04-17 NOTE — Patient Instructions (Addendum)
We Do NOT Approve of  [facility], [place] Soliciting Our Patients  To Do Home Visits  & We Do NOT Approve of [employer] > > > > > > > > > > > > > > > > > > > > > > > > > > > > > > > > > > >  > > > >   Preventive Care for Adults  A healthy lifestyle and preventive care can promote health and wellness. Preventive health guidelines for men include the following key practices:  A routine yearly physical is a good way to check with your health care provider about your health and preventative screening. It is a chance to share any concerns and updates on your health and to receive a thorough exam.  Visit your dentist for a routine exam and preventative care every 6 months. Brush your teeth twice a day and floss once a day. Good oral hygiene prevents tooth decay and gum disease.  The frequency of eye exams is based on your age, health, family medical history, use of contact lenses, and other factors. Follow your health care provider's recommendations for frequency of eye exams.  Eat a healthy diet. Foods such as vegetables, fruits, whole grains, low-fat dairy products, and lean protein foods contain the nutrients you need without too many calories. Decrease your intake of foods high in solid fats, added sugars, and salt. Eat the right amount of calories for you. Get information about a proper diet from your health care provider, if necessary.  Regular physical exercise is one of the most important things you can do for your health. Most adults should get at least 150 minutes of moderate-intensity exercise (any activity that increases your heart rate and causes you to sweat) each week. In addition, most adults need muscle-strengthening exercises on 2 or more days a week.  Maintain a healthy weight. The body mass index (BMI) is a screening tool to identify possible weight problems. It provides an estimate of body fat based on height and weight. Your health care provider can find  your BMI and can help you achieve or maintain a healthy weight. For adults 20 years and older:  A BMI below 18.5 is considered underweight.  A BMI of 18.5 to 24.9 is normal.  A BMI of 25 to 29.9 is considered overweight.  A BMI of 30 and above is considered obese.  Maintain normal blood lipids and cholesterol levels by exercising and minimizing your intake of saturated fat. Eat a balanced diet with plenty of fruit and vegetables. Blood tests for lipids and cholesterol should begin at age 20 and be repeated every 5 years. If your lipid or cholesterol levels are high, you are over 50, or you are at high risk for heart disease, you may need your cholesterol levels checked more frequently. Ongoing high lipid and cholesterol levels should be treated with medicines if diet and exercise are not working.  If you smoke, find out from your health care provider how to quit. If you do not use tobacco, do not start.  Lung cancer screening is recommended for adults aged 55-80 years who are at high risk for developing lung cancer because of a history of smoking. A yearly low-dose CT scan of the lungs is recommended for people who have at least a 30-pack-year history of smoking and are a current smoker or have quit within the past 15 years. A pack year of smoking is smoking an average of 1 pack   of cigarettes a day for 1 year (for example: 1 pack a day for 30 years or 2 packs a day for 15 years). Yearly screening should continue until the smoker has stopped smoking for at least 15 years. Yearly screening should be stopped for people who develop a health problem that would prevent them from having lung cancer treatment.  If you choose to drink alcohol, do not have more than 2 drinks per day. One drink is considered to be 12 ounces (355 mL) of beer, 5 ounces (148 mL) of wine, or 1.5 ounces (44 mL) of liquor.  Avoid use of street drugs. Do not share needles with anyone. Ask for help if you need support or instructions  about stopping the use of drugs.  High blood pressure causes heart disease and increases the risk of stroke. Your blood pressure should be checked at least every 1-2 years. Ongoing high blood pressure should be treated with medicines, if weight loss and exercise are not effective.  If you are 45-79 years old, ask your health care provider if you should take aspirin to prevent heart disease.  Diabetes screening involves taking a blood sample to check your fasting blood sugar level. Testing should be considered at a younger age or be carried out more frequently if you are overweight and have at least 1 risk factor for diabetes.  Colorectal cancer can be detected and often prevented. Most routine colorectal cancer screening begins at the age of 50 and continues through age 75. However, your health care provider may recommend screening at an earlier age if you have risk factors for colon cancer. On a yearly basis, your health care provider may provide home test kits to check for hidden blood in the stool. Use of a small camera at the end of a tube to directly examine the colon (sigmoidoscopy or colonoscopy) can detect the earliest forms of colorectal cancer. Talk to your health care provider about this at age 50, when routine screening begins. Direct exam of the colon should be repeated every 5-10 years through age 75, unless early forms of precancerous polyps or small growths are found.  Hepatitis C blood testing is recommended for all people born from 1945 through 1965 and any individual with known risks for hepatitis C.  Screening for abdominal aortic aneurysm (AAA)  by ultrasound is recommended for people who have history of high blood pressure or who are current or former smokers.  Healthy men should  receive prostate-specific antigen (PSA) blood tests as part of routine cancer screening. Talk with your health care provider about prostate cancer screening.  Testicular cancer screening is   recommended for adult males. Screening includes self-exam, a health care provider exam, and other screening tests. Consult with your health care provider about any symptoms you have or any concerns you have about testicular cancer.  Use sunscreen. Apply sunscreen liberally and repeatedly throughout the day. You should seek shade when your shadow is shorter than you. Protect yourself by wearing long sleeves, pants, a wide-brimmed hat, and sunglasses year round, whenever you are outdoors.  Once a month, do a whole-body skin exam, using a mirror to look at the skin on your back. Tell your health care provider about new moles, moles that have irregular borders, moles that are larger than a pencil eraser, or moles that have changed in shape or color.  Stay current with required vaccines (immunizations).  Influenza vaccine. All adults should be immunized every year.  Tetanus, diphtheria, and acellular pertussis (  Td, Tdap) vaccine. An adult who has not previously received Tdap or who does not know his vaccine status should receive 1 dose of Tdap. This initial dose should be followed by tetanus and diphtheria toxoids (Td) booster doses every 10 years. Adults with an unknown or incomplete history of completing a 3-dose immunization series with Td-containing vaccines should begin or complete a primary immunization series including a Tdap dose. Adults should receive a Td booster every 10 years.  Zoster vaccine. One dose is recommended for adults aged 60 years or older unless certain conditions are present.    PREVNAR - Pneumococcal 13-valent conjugate (PCV13) vaccine. When indicated, a person who is uncertain of his immunization history and has no record of immunization should receive the PCV13 vaccine. An adult aged 19 years or older who has certain medical conditions and has not been previously immunized should receive 1 dose of PCV13 vaccine. This PCV13 should be followed with a dose of pneumococcal  polysaccharide (PPSV23) vaccine. The PPSV23 vaccine dose should be obtained 1 or more year(s)after the dose of PCV13 vaccine. An adult aged 19 years or older who has certain medical conditions and previously received 1 or more doses of PPSV23 vaccine should receive 1 dose of PCV13. The PCV13 vaccine dose should be obtained 1 or more years after the last PPSV23 vaccine dose.    PNEUMOVAX - Pneumococcal polysaccharide (PPSV23) vaccine. When PCV13 is also indicated, PCV13 should be obtained first. All adults aged 65 years and older should be immunized. An adult younger than age 65 years who has certain medical conditions should be immunized. Any person who resides in a nursing home or long-term care facility should be immunized. An adult smoker should be immunized. People with an immunocompromised condition and certain other conditions should receive both PCV13 and PPSV23 vaccines. People with human immunodeficiency virus (HIV) infection should be immunized as soon as possible after diagnosis. Immunization during chemotherapy or radiation therapy should be avoided. Routine use of PPSV23 vaccine is not recommended for American Indians, [place] Natives, or people younger than 65 years unless there are medical conditions that require PPSV23 vaccine. When indicated, people who have unknown immunization and have no record of immunization should receive PPSV23 vaccine. One-time revaccination 5 years after the first dose of PPSV23 is recommended for people aged 19-64 years who have chronic kidney failure, nephrotic syndrome, asplenia, or immunocompromised conditions. People who received 1-2 doses of PPSV23 before age 65 years should receive another dose of PPSV23 vaccine at age 65 years or later if at least 5 years have passed since the previous dose. Doses of PPSV23 are not needed for people immunized with PPSV23 at or after age 65 years.    Hepatitis A vaccine. Adults who wish to be protected from this disease, have  certain high-risk conditions, work with hepatitis A-infected animals, work in hepatitis A research labs, or travel to or work in countries with a high rate of hepatitis A should be immunized. Adults who were previously unvaccinated and who anticipate close contact with an international adoptee during the first 60 days after arrival in the [place] from a country with a high rate of hepatitis A should be immunized.    Hepatitis B vaccine. Adults should be immunized if they wish to be protected from this disease, have certain high-risk conditions, may be exposed to blood or other infectious body fluids, are household contacts or sex partners of hepatitis B positive people, are clients or workers in certain care facilities, or   travel to or work in countries with a high rate of hepatitis B.   Preventive Service / Frequency   Ages 65 and over  Blood pressure check.  Lipid and cholesterol check.  Lung cancer screening. / Every year if you are aged 55-80 years and have a 30-pack-year history of smoking and currently smoke or have quit within the past 15 years. Yearly screening is stopped once you have quit smoking for at least 15 years or develop a health problem that would prevent you from having lung cancer treatment.  Fecal occult blood test (FOBT) of stool. You may not have to do this test if you get a colonoscopy every 10 years.  Flexible sigmoidoscopy** or colonoscopy.** / Every 5 years for a flexible sigmoidoscopy or every 10 years for a colonoscopy beginning at age 50 and continuing until age 75.  Hepatitis C blood test.** / For all people born from 1945 through 1965 and any individual with known risks for hepatitis C.  Abdominal aortic aneurysm (AAA) screening./ Screening current or former smokers or have Hypertension.  Skin self-exam. / Monthly.  Influenza vaccine. / Every year.  Tetanus, diphtheria, and acellular pertussis (Tdap/Td) vaccine.** / 1 dose of Td every 10  years.   Zoster vaccine.** / 1 dose for adults aged 60 years or older.         Pneumococcal 13-valent conjugate (PCV13) vaccine.    Pneumococcal polysaccharide (PPSV23) vaccine.     Hepatitis A vaccine.** / Consult your health care provider.  Hepatitis B vaccine.** / Consult your health care provider. Screening for abdominal aortic aneurysm (AAA)  by ultrasound is recommended for people who have history of high blood pressure or who are current or former smokers. ++++++++++ Recommend Adult Low Dose Aspirin or  coated  Aspirin 81 mg daily  To reduce risk of Colon Cancer 20 %,  Skin Cancer 26 % ,  Malignant Melanoma 46%  and  Pancreatic cancer 60% ++++++++++++++++++++++ Vitamin D goal  is between 70-100.  Please make sure that you are taking your Vitamin D as directed.  It is very important as a natural anti-inflammatory  helping hair, skin, and nails, as well as reducing stroke and heart attack risk.  It helps your bones and helps with mood. It also decreases numerous cancer risks so please take it as directed.  Low Vit D is associated with a 200-300% higher risk for CANCER  and 200-300% higher risk for HEART   ATTACK  &  STROKE.   ...................................... It is also associated with higher death rate at younger ages,  autoimmune diseases like Rheumatoid arthritis, Lupus, Multiple Sclerosis.    Also many other serious conditions, like depression, Alzheimer's Dementia, infertility, muscle aches, fatigue, fibromyalgia - just to name a few. ++++++++++++++++++++++ Recommend the book "The END of DIETING" by Dr Joel Fuhrman  & the book "The END of DIABETES " by Dr Joel Fuhrman At [employer] - get book & Audio CD's    Being diabetic has a  300% increased risk for heart attack, stroke, cancer, and alzheimer- type vascular dementia. It is very important that you work harder with diet by avoiding all foods that are white. Avoid white rice (brown & wild rice is OK), white  potatoes (sweetpotatoes in moderation is OK), White bread or wheat bread or anything made out of white flour like bagels, donuts, rolls, buns, biscuits, cakes, pastries, cookies, pizza crust, and pasta (made from white flour & egg whites) - vegetarian pasta or spinach or wheat   pasta is OK. Multigrain breads like Arnold's or [employer], or multigrain sandwich thins or flatbreads.  Diet, exercise and weight loss can reverse and cure diabetes in the early stages.  Diet, exercise and weight loss is very important in the control and prevention of complications of diabetes which affects every system in your body, ie. Brain - dementia/stroke, eyes - glaucoma/blindness, heart - heart attack/heart failure, kidneys - dialysis, stomach - gastric paralysis, intestines - malabsorption, nerves - severe painful neuritis, circulation - gangrene & loss of a leg(s), and finally cancer and Alzheimers.    I recommend avoid fried & greasy foods,  sweets/candy, white rice (brown or wild rice or Quinoa is OK), white potatoes (sweet potatoes are OK) - anything made from white flour - bagels, doughnuts, rolls, buns, biscuits,white and wheat breads, pizza crust and traditional pasta made of white flour & egg white(vegetarian pasta or spinach or wheat pasta is OK).  Multi-grain bread is OK - like multi-grain flat bread or sandwich thins. Avoid alcohol in excess. Exercise is also important.    Eat all the vegetables you want - avoid meat, especially red meat and dairy - especially cheese.  Cheese is the most concentrated form of trans-fats which is the worst thing to clog up our arteries. Veggie cheese is OK which can be found in the fresh produce section at [employer] or [employer] or [employer]  ++++++++++++++++++++++ DASH Eating Plan  DASH stands for "Dietary Approaches to Stop Hypertension."   The DASH eating plan is a healthy eating plan that has been shown to reduce high blood pressure (hypertension). Additional health  benefits may include reducing the risk of type 2 diabetes mellitus, heart disease, and stroke. The DASH eating plan may also help with weight loss. WHAT DO I NEED TO KNOW ABOUT THE DASH EATING PLAN? For the DASH eating plan, you will follow these general guidelines:  Choose foods with a percent daily value for sodium of less than 5% (as listed on the food label).  Use salt-free seasonings or herbs instead of table salt or sea salt.  Check with your health care provider or pharmacist before using salt substitutes.  Eat lower-sodium products, often labeled as "lower sodium" or "no salt added."  Eat fresh foods.  Eat more vegetables, fruits, and low-fat dairy products.  Choose whole grains. Look for the word "whole" as the first word in the ingredient list.  Choose fish   Limit sweets, desserts, sugars, and sugary drinks.  Choose heart-healthy fats.  Eat veggie cheese   Eat more home-cooked food and less restaurant, buffet, and fast food.  Limit fried foods.  Cook foods using methods other than frying.  Limit canned vegetables. If you do use them, rinse them well to decrease the sodium.  When eating at a restaurant, ask that your food be prepared with less salt, or no salt if possible.                      WHAT FOODS CAN I EAT? Read Dr Joel Fuhrman's books on The End of Dieting & The End of Diabetes  Grains Whole grain or whole wheat bread. Brown rice. Whole grain or whole wheat pasta. Quinoa, bulgur, and whole grain cereals. Low-sodium cereals. Corn or whole wheat flour tortillas. Whole grain cornbread. Whole grain crackers. Low-sodium crackers.  Vegetables Fresh or frozen vegetables (raw, steamed, roasted, or grilled). Low-sodium or reduced-sodium tomato and vegetable juices. Low-sodium or reduced-sodium tomato sauce and paste. Low-sodium or   reduced-sodium canned vegetables.   Fruits All fresh, canned (in natural juice), or frozen fruits.  Protein Products  All fish  and seafood.  Dried beans, peas, or lentils. Unsalted nuts and seeds. Unsalted canned beans.  Dairy Low-fat dairy products, such as skim or 1% milk, 2% or reduced-fat cheeses, low-fat ricotta or cottage cheese, or plain low-fat yogurt. Low-sodium or reduced-sodium cheeses.  Fats and Oils Tub margarines without trans fats. Light or reduced-fat mayonnaise and salad dressings (reduced sodium). Avocado. Safflower, olive, or canola oils. Natural peanut or almond butter.  Other Unsalted popcorn and pretzels. The items listed above may not be a complete list of recommended foods or beverages. Contact your dietitian for more options.  ++++++++++++++++++++  WHAT FOODS ARE NOT RECOMMENDED? Grains/ White flour or wheat flour White bread. White pasta. White rice. Refined cornbread. Bagels and croissants. Crackers that contain trans fat.  Vegetables  Creamed or fried vegetables. Vegetables in a . Regular canned vegetables. Regular canned tomato sauce and paste. Regular tomato and vegetable juices.  Fruits Dried fruits. Canned fruit in light or heavy syrup. Fruit juice.  Meat and Other Protein Products Meat in general - RED meat & White meat.  Fatty cuts of meat. Ribs, chicken wings, all processed meats as bacon, sausage, bologna, salami, fatback, hot dogs, bratwurst and packaged luncheon meats.  Dairy Whole or 2% milk, cream, half-and-half, and cream cheese. Whole-fat or sweetened yogurt. Full-fat cheeses or blue cheese. Non-dairy creamers and whipped toppings. Processed cheese, cheese spreads, or cheese curds.  Condiments Onion and garlic salt, seasoned salt, table salt, and sea salt. Canned and packaged gravies. Worcestershire sauce. Tartar sauce. Barbecue sauce. Teriyaki sauce. Soy sauce, including reduced sodium. Steak sauce. Fish sauce. Oyster sauce. Cocktail sauce. Horseradish. Ketchup and mustard. Meat flavorings and tenderizers. Bouillon cubes. Hot sauce. Tabasco sauce. Marinades. Taco  seasonings. Relishes.  Fats and Oils Butter, stick margarine, lard, shortening and bacon fat. Coconut, palm kernel, or palm oils. Regular salad dressings.  Pickles and olives. Salted popcorn and pretzels.  The items listed above may not be a complete list of foods and beverages to avoid.    We Do NOT Approve of  [facility], [employer]  Our Patients  To Do Home Visits  & We Do NOT Approve of [facility] > > > > > > > > > > > > > > > > > > > > > > > > > > > > > > > > > > >  > > > >   Preventive Care for Adults  A healthy lifestyle and preventive care can promote health and wellness. Preventive health guidelines for men include the following key practices:  A routine yearly physical is a good way to check with your health care provider about your health and preventative screening. It is a chance to share any concerns and updates on your health and to receive a thorough exam.  Visit your dentist for a routine exam and preventative care every 6 months. Brush your teeth twice a day and floss once a day. Good oral hygiene prevents tooth decay and gum disease.  The frequency of eye exams is based on your age, health, family medical history, use of contact lenses, and other factors. Follow your health care provider's recommendations for frequency of eye exams.  Eat a healthy diet. Foods such as vegetables, fruits, whole grains, low-fat dairy products, and lean protein foods contain the nutrients you need without too many calories. Decrease your intake of  foods high in solid fats, added sugars, and salt. Eat the right amount of calories for you. Get information about a proper diet from your health care provider, if necessary.  Regular physical exercise is one of the most important things you can do for your health. Most adults should get at least 150 minutes of moderate-intensity exercise (any activity that increases your heart rate and causes you to sweat) each week.  In addition, most adults need muscle-strengthening exercises on 2 or more days a week.  Maintain a healthy weight. The body mass index (BMI) is a screening tool to identify possible weight problems. It provides an estimate of body fat based on height and weight. Your health care provider can find your BMI and can help you achieve or maintain a healthy weight. For adults 20 years and older:  A BMI below 18.5 is considered underweight.  A BMI of 18.5 to 24.9 is normal.  A BMI of 25 to 29.9 is considered overweight.  A BMI of 30 and above is considered obese.  Maintain normal blood lipids and cholesterol levels by exercising and minimizing your intake of saturated fat. Eat a balanced diet with plenty of fruit and vegetables. Blood tests for lipids and cholesterol should begin at age 66 and be repeated every 5 years. If your lipid or cholesterol levels are high, you are over 50, or you are at high risk for heart disease, you may need your cholesterol levels checked more frequently. Ongoing high lipid and cholesterol levels should be treated with medicines if diet and exercise are not working.  If you smoke, find out from your health care provider how to quit. If you do not use tobacco, do not start.  Lung cancer screening is recommended for adults aged 41-80 years who are at high risk for developing lung cancer because of a history of smoking. A yearly low-dose CT scan of the lungs is recommended for people who have at least a 30-pack-year history of smoking and are a current smoker or have quit within the past 15 years. A pack year of smoking is smoking an average of 1 pack of cigarettes a day for 1 year (for example: 1 pack a day for 30 years or 2 packs a day for 15 years). Yearly screening should continue until the smoker has stopped smoking for at least 15 years. Yearly screening should be stopped for people who develop a health problem that would prevent them from having lung cancer treatment.  If  you choose to drink alcohol, do not have more than 2 drinks per day. One drink is considered to be 12 ounces (355 mL) of beer, 5 ounces (148 mL) of wine, or 1.5 ounces (44 mL) of liquor.  Avoid use of street drugs. Do not share needles with anyone. Ask for help if you need support or instructions about stopping the use of drugs.  High blood pressure causes heart disease and increases the risk of stroke. Your blood pressure should be checked at least every 1-2 years. Ongoing high blood pressure should be treated with medicines, if weight loss and exercise are not effective.  If you are 82-7 years old, ask your health care provider if you should take aspirin to prevent heart disease.  Diabetes screening involves taking a blood sample to check your fasting blood sugar level. Testing should be considered at a younger age or be carried out more frequently if you are overweight and have at least 1 risk factor for diabetes.  Colorectal cancer can be detected and often prevented. Most routine colorectal cancer screening begins at the age of 76 and continues through age 36. However, your health care provider may recommend screening at an earlier age if you have risk factors for colon cancer. On a yearly basis, your health care provider may provide home test kits to check for hidden blood in the stool. Use of a small camera at the end of a tube to directly examine the colon (sigmoidoscopy or colonoscopy) can detect the earliest forms of colorectal cancer. Talk to your health care provider about this at age 24, when routine screening begins. Direct exam of the colon should be repeated every 5-10 years through age 77, unless early forms of precancerous polyps or small growths are found.  Hepatitis C blood testing is recommended for all people born from [birth year] through 1965 and any individual with known risks for hepatitis C.  Screening for abdominal aortic aneurysm (AAA)  by ultrasound is recommended for people who  have history of high blood pressure or who are current or former smokers.  Healthy men should  receive prostate-specific antigen (PSA) blood tests as part of routine cancer screening. Talk with your health care provider about prostate cancer screening.  Testicular cancer screening is  recommended for adult males. Screening includes self-exam, a health care provider exam, and other screening tests. Consult with your health care provider about any symptoms you have or any concerns you have about testicular cancer.  Use sunscreen. Apply sunscreen liberally and repeatedly throughout the day. You should seek shade when your shadow is shorter than you. Protect yourself by wearing long sleeves, pants, a wide-brimmed hat, and sunglasses year round, whenever you are outdoors.  Once a month, do a whole-body skin exam, using a mirror to look at the skin on your back. Tell your health care provider about new moles, moles that have irregular borders, moles that are larger than a pencil eraser, or moles that have changed in shape or color.  Stay current with required vaccines (immunizations).  Influenza vaccine. All adults should be immunized every year.  Tetanus, diphtheria, and acellular pertussis (Td, Tdap) vaccine. An adult who has not previously received Tdap or who does not know his vaccine status should receive 1 dose of Tdap. This initial dose should be followed by tetanus and diphtheria toxoids (Td) booster doses every 10 years. Adults with an unknown or incomplete history of completing a 3-dose immunization series with Td-containing vaccines should begin or complete a primary immunization series including a Tdap dose. Adults should receive a Td booster every 10 years.  Zoster vaccine. One dose is recommended for adults aged 14 years or older unless certain conditions are present.    PREVNAR - Pneumococcal 13-valent conjugate (PCV13) vaccine. When indicated, a person who is uncertain of his  immunization history and has no record of immunization should receive the PCV13 vaccine. An adult aged 68 years or older who has certain medical conditions and has not been previously immunized should receive 1 dose of PCV13 vaccine. This PCV13 should be followed with a dose of pneumococcal polysaccharide (PPSV23) vaccine. The PPSV23 vaccine dose should be obtained 1 or more year(s)after the dose of PCV13 vaccine. An adult aged 26 years or older who has certain medical conditions and previously received 1 or more doses of PPSV23 vaccine should receive 1 dose of PCV13. The PCV13 vaccine dose should be obtained 1 or more years after the last PPSV23 vaccine dose.    PNEUMOVAX -  Pneumococcal polysaccharide (PPSV23) vaccine. When PCV13 is also indicated, PCV13 should be obtained first. All adults aged 34 years and older should be immunized. An adult younger than age 26 years who has certain medical conditions should be immunized. Any person who resides in a nursing home or long-term care facility should be immunized. An adult smoker should be immunized. People with an immunocompromised condition and certain other conditions should receive both PCV13 and PPSV23 vaccines. People with human immunodeficiency virus (HIV) infection should be immunized as soon as possible after diagnosis. Immunization during chemotherapy or radiation therapy should be avoided. Routine use of PPSV23 vaccine is not recommended for American Indians, [place] Natives, or people younger than 65 years unless there are medical conditions that require PPSV23 vaccine. When indicated, people who have unknown immunization and have no record of immunization should receive PPSV23 vaccine. One-time revaccination 5 years after the first dose of PPSV23 is recommended for people aged 19-64 years who have chronic kidney failure, nephrotic syndrome, asplenia, or immunocompromised conditions. People who received 1-2 doses of PPSV23 before age 68 years should  receive another dose of PPSV23 vaccine at age 23 years or later if at least 5 years have passed since the previous dose. Doses of PPSV23 are not needed for people immunized with PPSV23 at or after age 98 years.    Hepatitis A vaccine. Adults who wish to be protected from this disease, have certain high-risk conditions, work with hepatitis A-infected animals, work in hepatitis A research labs, or travel to or work in countries with a high rate of hepatitis A should be immunized. Adults who were previously unvaccinated and who anticipate close contact with an international adoptee during the first 60 days after arrival in the [place] from a country with a high rate of hepatitis A should be immunized.    Hepatitis B vaccine. Adults should be immunized if they wish to be protected from this disease, have certain high-risk conditions, may be exposed to blood or other infectious body fluids, are household contacts or sex partners of hepatitis B positive people, are clients or workers in certain care facilities, or travel to or work in countries with a high rate of hepatitis B.   Preventive Service / Frequency   Ages 38 and over  Blood pressure check.  Lipid and cholesterol check.  Lung cancer screening. / Every year if you are aged 54-80 years and have a 30-pack-year history of smoking and currently smoke or have quit within the past 15 years. Yearly screening is stopped once you have quit smoking for at least 15 years or develop a health problem that would prevent you from having lung cancer treatment.  Fecal occult blood test (FOBT) of stool. You may not have to do this test if you get a colonoscopy every 10 years.  Flexible sigmoidoscopy** or colonoscopy.** / Every 5 years for a flexible sigmoidoscopy or every 10 years for a colonoscopy beginning at age 35 and continuing until age 50.  Hepatitis C blood test.** / For all people born from [birth year] through 1964 and any individual with known  risks for hepatitis C.  Abdominal aortic aneurysm (AAA) screening./ Screening current or former smokers or have Hypertension.  Skin self-exam. / Monthly.  Influenza vaccine. / Every year.  Tetanus, diphtheria, and acellular pertussis (Tdap/Td) vaccine.** / 1 dose of Td every 10 years.   Zoster vaccine.** / 1 dose for adults aged 30 years or older.         Pneumococcal 13-valent  conjugate (PCV13) vaccine.    Pneumococcal polysaccharide (PPSV23) vaccine.     Hepatitis A vaccine.** / Consult your health care provider.  Hepatitis B vaccine.** / Consult your health care provider. Screening for abdominal aortic aneurysm (AAA)  by ultrasound is recommended for people who have history of high blood pressure or who are current or former smokers. ++++++++++ Recommend Adult Low Dose Aspirin or  coated  Aspirin 81 mg daily  To reduce risk of Colon Cancer 20 %,  Skin Cancer 26 % ,  Malignant Melanoma 46%  and  Pancreatic cancer 60% ++++++++++++++++++++++ Vitamin D goal  is between 70-100.  Please make sure that you are taking your Vitamin D as directed.  It is very important as a natural anti-inflammatory  helping hair, skin, and nails, as well as reducing stroke and heart attack risk.  It helps your bones and helps with mood. It also decreases numerous cancer risks so please take it as directed.  Low Vit D is associated with a 200-300% higher risk for CANCER  and 200-300% higher risk for HEART   ATTACK  &  STROKE.   .....................................Marland Kitchen It is also associated with higher death rate at younger ages,  autoimmune diseases like Rheumatoid arthritis, Lupus, Multiple Sclerosis.    Also many other serious conditions, like depression, Alzheimer's Dementia, infertility, muscle aches, fatigue, fibromyalgia - just to name a few. ++++++++++++++++++++++ Recommend the book "The END of DIETING" by Dr Excell Seltzer  & the book "The END of DIABETES " by Dr Excell Seltzer At  [email or web address] - get book & Audio CD's    Being diabetic has a  300% increased risk for heart attack, stroke, cancer, and alzheimer- type vascular dementia. It is very important that you work harder with diet by avoiding all foods that are white. Avoid white rice (brown & wild rice is OK), white potatoes (sweetpotatoes in moderation is OK), White bread or wheat bread or anything made out of white flour like bagels, donuts, rolls, buns, biscuits, cakes, pastries, cookies, pizza crust, and pasta (made from white flour & egg whites) - vegetarian pasta or spinach or wheat pasta is OK. Multigrain breads like Arnold's or Pepperidge [employer], or multigrain sandwich thins or flatbreads.  Diet, exercise and weight loss can reverse and cure diabetes in the early stages.  Diet, exercise and weight loss is very important in the control and prevention of complications of diabetes which affects every system in your body, ie. Brain - dementia/stroke, eyes - glaucoma/blindness, heart - heart attack/heart failure, kidneys - dialysis, stomach - gastric paralysis, intestines - malabsorption, nerves - severe painful neuritis, circulation - gangrene & loss of a leg(s), and finally cancer and Alzheimers.    I recommend avoid fried & greasy foods,  sweets/candy, white rice (brown or wild rice or Quinoa is OK), white potatoes (sweet potatoes are OK) - anything made from white flour - bagels, doughnuts, rolls, buns, biscuits,white and wheat breads, pizza crust and traditional pasta made of white flour & egg white(vegetarian pasta or spinach or wheat pasta is OK).  Multi-grain bread is OK - like multi-grain flat bread or sandwich thins. Avoid alcohol in excess. Exercise is also important.    Eat all the vegetables you want - avoid meat, especially red meat and dairy - especially cheese.  Cheese is the most concentrated form of trans-fats which is the worst thing to clog up our arteries. Veggie cheese is OK which can be found in the fresh  produce section at [facility]  or [employer] or [employer]  ++++++++++++++++++++++ DASH Eating Plan  DASH stands for "Dietary Approaches to Stop Hypertension."   The DASH eating plan is a healthy eating plan that has been shown to reduce high blood pressure (hypertension). Additional health benefits may include reducing the risk of type 2 diabetes mellitus, heart disease, and stroke. The DASH eating plan may also help with weight loss. WHAT DO I NEED TO KNOW ABOUT THE DASH EATING PLAN? For the DASH eating plan, you will follow these general guidelines:  Choose foods with a percent daily value for sodium of less than 5% (as listed on the food label).  Use salt-free seasonings or herbs instead of table salt or sea salt.  Check with your health care provider or pharmacist before using salt substitutes.  Eat lower-sodium products, often labeled as "lower sodium" or "no salt added."  Eat fresh foods.  Eat more vegetables, fruits, and low-fat dairy products.  Choose whole grains. Look for the word "whole" as the first word in the ingredient list.  Choose fish   Limit sweets, desserts, sugars, and sugary drinks.  Choose heart-healthy fats.  Eat veggie cheese   Eat more home-cooked food and less restaurant, buffet, and fast food.  Limit fried foods.  Cook foods using methods other than frying.  Limit canned vegetables. If you do use them, rinse them well to decrease the sodium.  When eating at a restaurant, ask that your food be prepared with less salt, or no salt if possible.                      WHAT FOODS CAN I EAT? Read Dr Fara Olden Fuhrman's books on The End of Dieting & The End of Diabetes  Grains Whole grain or whole wheat bread. Brown rice. Whole grain or whole wheat pasta. Quinoa, bulgur, and whole grain cereals. Low-sodium cereals. Corn or whole wheat flour tortillas. Whole grain cornbread. Whole grain crackers. Low-sodium crackers.  Vegetables Fresh or frozen  vegetables (raw, steamed, roasted, or grilled). Low-sodium or reduced-sodium tomato and vegetable juices. Low-sodium or reduced-sodium tomato sauce and paste. Low-sodium or reduced-sodium canned vegetables.   Fruits All fresh, canned (in natural juice), or frozen fruits.  Protein Products  All fish and seafood.  Dried beans, peas, or lentils. Unsalted nuts and seeds. Unsalted canned beans.  Dairy Low-fat dairy products, such as skim or 1% milk, 2% or reduced-fat cheeses, low-fat ricotta or cottage cheese, or plain low-fat yogurt. Low-sodium or reduced-sodium cheeses.  Fats and Oils Tub margarines without trans fats. Light or reduced-fat mayonnaise and salad dressings (reduced sodium). Avocado. Safflower, olive, or canola oils. Natural peanut or almond butter.  Other Unsalted popcorn and pretzels. The items listed above may not be a complete list of recommended foods or beverages. Contact your dietitian for more options.  ++++++++++++++++++++  WHAT FOODS ARE NOT RECOMMENDED? Grains/ White flour or wheat flour White bread. White pasta. White rice. Refined cornbread. Bagels and croissants. Crackers that contain trans fat.  Vegetables  Creamed or fried vegetables. Vegetables in a . Regular canned vegetables. Regular canned tomato sauce and paste. Regular tomato and vegetable juices.  Fruits Dried fruits. Canned fruit in light or heavy syrup. Fruit juice.  Meat and Other Protein Products Meat in general - RED meat & White meat.  Fatty cuts of meat. Ribs, chicken wings, all processed meats as bacon, sausage, bologna, salami, fatback, hot dogs, bratwurst and packaged luncheon meats.  Dairy Whole or 2% milk, cream,  half-and-half, and cream cheese. Whole-fat or sweetened yogurt. Full-fat cheeses or blue cheese. Non-dairy creamers and whipped toppings. Processed cheese, cheese spreads, or cheese curds.  Condiments Onion and garlic salt, seasoned salt, table salt, and sea salt. Canned and  packaged gravies. Worcestershire sauce. Tartar sauce. Barbecue sauce. Teriyaki sauce. Soy sauce, including reduced sodium. Steak sauce. Fish sauce. Oyster sauce. Cocktail sauce. Horseradish. Ketchup and mustard. Meat flavorings and tenderizers. Bouillon cubes. Hot sauce. Tabasco sauce. Marinades. Taco seasonings. Relishes.  Fats and Oils Butter, stick margarine, lard, shortening and bacon fat. Coconut, palm kernel, or palm oils. Regular salad dressings.  Pickles and olives. Salted popcorn and pretzels.  The items listed above may not be a complete list of foods and beverages to avoid.

## 2018-04-17 NOTE — Progress Notes (Signed)
[facility]   Unk Pinto, M.D.     Uvaldo Bristle. Silverio Lay, P.A.-C Liane Comber, [place]                [address] [place], [place] [address] Telephone [phone] Telefax [phone] Annual  Screening/Preventative Visit  & Comprehensive Evaluation & Examination     This very nice 67 y.o. MWM presents for a Screening /Preventative Visit & comprehensive evaluation and management of multiple medical co-morbidities.  Patient has been followed for HTN, HLD, T2_NIDDM  and Vitamin D Deficiency. Altho, never dx'd with gout , he suspects  Gout with a painful Lt great toe & Rt ankle.                                                                                                                                                    HTN predates circa 2010. Patient's BP has been controlled at home.  Today's BP is at goal -  134/84. Patient denies any cardiac symptoms as chest pain, palpitations, shortness of breath, dizziness or ankle swelling.     Patient's hyperlipidemia is controlled with diet and medications. Patient denies myalgias or other medication SE's. Last lipids were not at goal: Lab Results  Component Value Date   CHOL 189 01/10/2018   HDL 33 (L) 01/10/2018   LDLCALC 122 (H) 01/10/2018   TRIG 218 (H) 01/10/2018   CHOLHDL 5.7 (H) 01/10/2018      Patient has T2_NIDDM (June 2017) and patient denies reactive hypoglycemic symptoms, visual blurring, diabetic polys or paresthesias. Last A1c was near goal: Lab Results  Component Value Date   HGBA1C 5.8 (H) 01/10/2018       Finally, patient has history of Vitamin D Deficiency ("23"/Mar 2018)  and last vitamin D was still very low: Lab Results  Component Value Date   VD25OH 33 01/10/2018   Current Outpatient Medications on File Prior to Visit  Medication Sig  . ACCU-CHEK SOFTCLIX LANCETS lancets Use once daily with Accu-Chek meter to check BS  reading. Dx. E11.9  . VITAMIN D Take 1,000 Units by mouth daily.  . TYLENOL PM 25-500 MG TABS\ Take 1 tablet by mouth at bedtime as needed.  . gabapentin  100 MG capsule Take 1-2 capsules at bedtime.  Marland Kitchen ibuprofen 200 MG tablet Take 800 mg by mouth at bedtime as needed.  Marland Kitchen lisinopril-hctz 20-25 MG tablet Take 1 tablet by mouth daily.  . metFORMIN-XR 500 MG  Take 1 to 2 tablets 2 x / day with meals for Diabetes  . Multiple Vitamin  Take 1 tablet by mouth daily.  . Rosuvastatin 40 MG tablet Take 1/2-1 tablet daily - take 1 tab qod   No Known  Allergies   Past Medical History:  Diagnosis Date  . Diabetes mellitus without complication ([place]) 4825  . Hyperlipidemia 2017  . Hypertension 2010   Health Maintenance  Topic Date Due  . Hepatitis C Screening  [birth year]  . OPHTHALMOLOGY EXAM  12/06/1961  . COLONOSCOPY  12/06/2001  . PNA vac Low Risk Adult (1 of 2 - PCV13) 12/06/2016  . INFLUENZA VACCINE  05/05/2018  . HEMOGLOBIN A1C  07/12/2018  . FOOT EXAM  04/18/2019  . TETANUS/TDAP  10/13/2025   Immunization History  Administered Date(s) Administered  . Influenza, High Dose Seasonal PF 06/24/2017  . Tdap 10/14/2015   Last Colon - never - has received a Cologard kit  Which he has not sent back.    History reviewed. No pertinent surgical history.   Family History  Problem Relation Age of Onset  . Hypertension Mother   . Heart disease Mother   . Diabetes Father   . Hyperlipidemia Father   . Hypertension Father    Social History   Socioeconomic History  . Marital status: Married    Spouse name: Not on file  . Number of children: Not on file  Occupational History  .   Tobacco Use  . Smoking status: Never Smoker  . Smokeless tobacco: Never Used  Substance and Sexual Activity  . Alcohol use: Yes  . Drug use: No  . Sexual activity: Yes    ROS Constitutional: Denies fever, chills, weight loss/gain, headaches, insomnia,  night sweats or change in appetite. Does c/o  fatigue. Eyes: Denies redness, blurred vision, diplopia, discharge, itchy or watery eyes.  ENT: Denies discharge, congestion, post nasal drip, epistaxis, sore throat, earache, hearing loss, dental pain, Tinnitus, Vertigo, Sinus pain or snoring.  Cardio: Denies chest pain, palpitations, irregular heartbeat, syncope, dyspnea, diaphoresis, orthopnea, PND, claudication or edema Respiratory: denies cough, dyspnea, DOE, pleurisy, hoarseness, laryngitis or wheezing.  Gastrointestinal: Denies dysphagia, heartburn, reflux, water brash, pain, cramps, nausea, vomiting, bloating, diarrhea, constipation, hematemesis, melena, hematochezia, jaundice or hemorrhoids Genitourinary: Denies dysuria, frequency, urgency, nocturia, hesitancy, discharge, hematuria or flank pain Musculoskeletal: Denies arthralgia, myalgia, stiffness, Jt. Swelling, pain, limp or strain/sprain. Denies Falls. Skin: Denies puritis, rash, hives, warts, acne, eczema or change in skin lesion Neuro: No weakness, tremor, incoordination, spasms, paresthesia or pain Psychiatric: Denies confusion, memory loss or sensory loss. Denies Depression. Endocrine: Denies change in weight, skin, hair change, nocturia, and paresthesia, diabetic polys, visual blurring or hyper / hypo glycemic episodes.  Heme/Lymph: No excessive bleeding, bruising or enlarged lymph nodes.  Physical Exam  BP 134/84   Pulse 72   Temp 97.6 F (36.4 C)   Resp 18   Ht 6' 2.5" (1.892 m)   Wt 274 lb 3.2 oz (124.4 kg)   BMI 34.73 kg/m   General Appearance: Well nourished and well groomed and in no apparent distress.  Eyes: PERRLA, EOMs, conjunctiva no swelling or erythema, normal fundi and vessels. Sinuses: No frontal/maxillary tenderness ENT/Mouth: EACs patent / TMs  nl. Nares clear without erythema, swelling, mucoid exudates. Oral hygiene is good. No erythema, swelling, or exudate. Tongue normal, non-obstructing. Tonsils not swollen or erythematous. Hearing normal.  Neck:  Supple, thyroid not palpable. No bruits, nodes or JVD. Respiratory: Respiratory effort normal.  BS equal and clear bilateral without rales, rhonci, wheezing or stridor. Cardio: Heart sounds are normal with regular rate and rhythm and no murmurs, rubs or gallops. Peripheral pulses are normal and equal bilaterally without edema. No aortic or femoral bruits. Chest: symmetric with normal  excursions and percussion.  Abdomen: Soft, with Nl bowel sounds. Nontender, no guarding, rebound, hernias, masses, or organomegaly.  Lymphatics: Non tender without lymphadenopathy.  Genitourinary: No hernias.Testes nl. DRE - prostate nl for age - smooth & firm w/o nodules. Musculoskeletal: Full ROM all peripheral extremities, joint stability, 5/5 strength, and normal gait. Skin: Warm and dry without rashes, lesions, cyanosis, clubbing or  ecchymosis.  Neuro: Cranial nerves intact, reflexes equal bilaterally. Normal muscle tone, no cerebellar symptoms. Sensation intact.  Pysch: Alert and oriented X 3 with normal affect, insight and judgment appropriate.   Assessment and Plan  1. Annual Preventative/Screening Exam   2. Essential hypertension  - EKG 12-Lead - [place], RETROPERITNL ABD,  LTD - Urinalysis, Routine w reflex microscopic - Urine Culture - Microalbumin / creatinine urine ratio - CBC with Differential/Platelet - COMPLETE METABOLIC PANEL WITH GFR - Magnesium - TSH  3. Hyperlipidemia, mixed  - EKG 12-Lead - [place], RETROPERITNL ABD,  LTD - Lipid panel - TSH  4. Type 2 diabetes mellitus with stage 2 chronic kidney disease, without long-term current use of insulin (HCC)  - EKG 12-Lead - [place], RETROPERITNL ABD,  LTD - Urinalysis, Routine w reflex microscopic - Urine Culture - Microalbumin / creatinine urine ratio - HM DIABETES FOOT EXAM - LOW EXTREMITY NEUR EXAM DOCUM - Hemoglobin A1c - Insulin, random  5. Vitamin D deficiency  - VITAMIN D 25 Hydroxyl  6. Screening for colorectal cancer  - POC  Hemoccult Bld/Stl (3-C  7. BPH with obstruction/lower urinary tract symptoms  - PSA  8. Screening for prostate cancer  - PSA  9. Screening for ischemic heart disease  - EKG 12-Lead  10. FHx: heart disease  - EKG 12-Lead - [place], RETROPERITNL ABD,  LTD  11. Screening for AAA (aortic abdominal aneurysm)  - [place], RETROPERITNL ABD,  LTD  12. Medication management  - Urinalysis, Routine w reflex microscopic - Urine Culture - Microalbumin / creatinine urine ratio - CBC with Differential/Platelet - COMPLETE METABOLIC PANEL WITH GFR - Magnesium - Lipid panel - TSH - Hemoglobin A1c - Insulin, random - VITAMIN D 25 Hydroxyl         Patient was counseled in prudent diet, weight control to achieve/maintain BMI less than 25, BP monitoring, regular exercise and medications as discussed.  Discussed med effects and SE's. Routine screening labs and tests as requested with regular follow-up as recommended. Over 40 minutes of exam, counseling, chart review and high complex critical decision making was performed

## 2018-04-18 ENCOUNTER — Ambulatory Visit (INDEPENDENT_AMBULATORY_CARE_PROVIDER_SITE_OTHER): Payer: Medicare HMO | Admitting: Internal Medicine

## 2018-04-18 ENCOUNTER — Other Ambulatory Visit: Payer: Self-pay | Admitting: *Deleted

## 2018-04-18 VITALS — BP 134/84 | HR 72 | Temp 97.6°F | Resp 18 | Ht 74.5 in | Wt 274.2 lb

## 2018-04-18 DIAGNOSIS — E1122 Type 2 diabetes mellitus with diabetic chronic kidney disease: Secondary | ICD-10-CM

## 2018-04-18 DIAGNOSIS — Z136 Encounter for screening for cardiovascular disorders: Secondary | ICD-10-CM

## 2018-04-18 DIAGNOSIS — I10 Essential (primary) hypertension: Secondary | ICD-10-CM | POA: Diagnosis not present

## 2018-04-18 DIAGNOSIS — Z8249 Family history of ischemic heart disease and other diseases of the circulatory system: Secondary | ICD-10-CM

## 2018-04-18 DIAGNOSIS — Z23 Encounter for immunization: Secondary | ICD-10-CM | POA: Diagnosis not present

## 2018-04-18 DIAGNOSIS — Z0001 Encounter for general adult medical examination with abnormal findings: Secondary | ICD-10-CM

## 2018-04-18 DIAGNOSIS — E782 Mixed hyperlipidemia: Secondary | ICD-10-CM

## 2018-04-18 DIAGNOSIS — Z125 Encounter for screening for malignant neoplasm of prostate: Secondary | ICD-10-CM | POA: Diagnosis not present

## 2018-04-18 DIAGNOSIS — N138 Other obstructive and reflux uropathy: Secondary | ICD-10-CM | POA: Diagnosis not present

## 2018-04-18 DIAGNOSIS — Z79899 Other long term (current) drug therapy: Secondary | ICD-10-CM

## 2018-04-18 DIAGNOSIS — Z0000 Encounter for general adult medical examination without abnormal findings: Secondary | ICD-10-CM

## 2018-04-18 DIAGNOSIS — E559 Vitamin D deficiency, unspecified: Secondary | ICD-10-CM | POA: Diagnosis not present

## 2018-04-18 DIAGNOSIS — N401 Enlarged prostate with lower urinary tract symptoms: Secondary | ICD-10-CM

## 2018-04-18 DIAGNOSIS — N182 Chronic kidney disease, stage 2 (mild): Secondary | ICD-10-CM | POA: Diagnosis not present

## 2018-04-18 DIAGNOSIS — Z1211 Encounter for screening for malignant neoplasm of colon: Secondary | ICD-10-CM

## 2018-04-18 DIAGNOSIS — Z1212 Encounter for screening for malignant neoplasm of rectum: Secondary | ICD-10-CM

## 2018-04-18 DIAGNOSIS — M109 Gout, unspecified: Secondary | ICD-10-CM

## 2018-04-18 MED ORDER — GABAPENTIN 100 MG PO CAPS: ORAL_CAPSULE | ORAL | 1 refills | Status: DC

## 2018-04-18 MED ORDER — ACCU-CHEK AVIVA PLUS W/DEVICE KIT: PACK | 0 refills | Status: DC

## 2018-04-18 MED ORDER — ACCU-CHEK SOFTCLIX LANCETS MISC: 12 refills | Status: DC

## 2018-04-18 MED ORDER — ROSUVASTATIN CALCIUM 40 MG PO TABS: ORAL_TABLET | ORAL | 3 refills | Status: DC

## 2018-04-18 MED ORDER — LISINOPRIL-HYDROCHLOROTHIAZIDE 20-25 MG PO TABS: 1.0000 | ORAL_TABLET | Freq: Every day | ORAL | 1 refills | Status: DC

## 2018-04-19 ENCOUNTER — Other Ambulatory Visit: Payer: Self-pay | Admitting: Internal Medicine

## 2018-04-19 LAB — URINALYSIS, ROUTINE W REFLEX MICROSCOPIC
BACTERIA UA: NONE SEEN /HPF
BILIRUBIN URINE: NEGATIVE
Glucose, UA: NEGATIVE
HGB URINE DIPSTICK: NEGATIVE
Hyaline Cast: NONE SEEN /LPF
KETONES UR: NEGATIVE
NITRITE: NEGATIVE
PH: 5.5 (ref 5.0–8.0)
Protein, ur: NEGATIVE
RBC / HPF: NONE SEEN /HPF (ref 0–2)
SQUAMOUS EPITHELIAL / LPF: NONE SEEN /HPF (ref <=5)
Specific Gravity, Urine: 1.019 (ref 1.001–1.03)

## 2018-04-19 LAB — HEMOGLOBIN A1C
EAG (MMOL/L): 6.6 (calc)
Hgb A1c MFr Bld: 5.8 % of total Hgb — ABNORMAL HIGH (ref <5.7)
Mean Plasma Glucose: 120 (calc)

## 2018-04-19 LAB — COMPLETE METABOLIC PANEL WITH GFR
AG RATIO: 1.6 (calc) (ref 1.0–2.5)
ALT: 60 U/L — ABNORMAL HIGH (ref 9–46)
AST: 39 U/L — ABNORMAL HIGH (ref 10–35)
Albumin: 4.7 g/dL (ref 3.6–5.1)
Alkaline phosphatase (APISO): 54 U/L (ref 40–115)
BUN: 13 mg/dL (ref 7–25)
CO2: 26 mmol/L (ref 20–32)
CREATININE: 0.92 mg/dL (ref 0.70–1.25)
Calcium: 10 mg/dL (ref 8.6–10.3)
Chloride: 100 mmol/L (ref 98–110)
GFR, EST AFRICAN AMERICAN: 100 mL/min/{1.73_m2} (ref >=60)
GFR, EST NON AFRICAN AMERICAN: 86 mL/min/{1.73_m2} (ref >=60)
GLOBULIN: 3 g/dL (ref 1.9–3.7)
GLUCOSE: 82 mg/dL (ref 65–99)
POTASSIUM: 4.1 mmol/L (ref 3.5–5.3)
SODIUM: 136 mmol/L (ref 135–146)
Total Bilirubin: 0.6 mg/dL (ref 0.2–1.2)
Total Protein: 7.7 g/dL (ref 6.1–8.1)

## 2018-04-19 LAB — CBC WITH DIFFERENTIAL/PLATELET
Basophils Absolute: 87 cells/uL (ref 0–200)
Basophils Relative: 1.1 %
EOS ABS: 71 {cells}/uL (ref 15–500)
Eosinophils Relative: 0.9 %
HCT: 41.8 % (ref 38.5–50.0)
HEMOGLOBIN: 14.4 g/dL (ref 13.2–17.1)
Lymphs Abs: 1730 cells/uL (ref 850–3900)
MCH: 31.6 pg (ref 27.0–33.0)
MCHC: 34.4 g/dL (ref 32.0–36.0)
MCV: 91.9 fL (ref 80.0–100.0)
MONOS PCT: 9.9 %
MPV: 10.9 fL (ref 7.5–12.5)
NEUTROS ABS: 5230 {cells}/uL (ref 1500–7800)
Neutrophils Relative %: 66.2 %
Platelets: 259 10*3/uL (ref 140–400)
RBC: 4.55 10*6/uL (ref 4.20–5.80)
RDW: 12.4 % (ref 11.0–15.0)
Total Lymphocyte: 21.9 %
WBC mixed population: 782 cells/uL (ref 200–950)
WBC: 7.9 10*3/uL (ref 3.8–10.8)

## 2018-04-19 LAB — LIPID PANEL
CHOLESTEROL: 127 mg/dL (ref <200)
HDL: 33 mg/dL — AB (ref >40)
LDL Cholesterol (Calc): 63 mg/dL (calc)
Non-HDL Cholesterol (Calc): 94 mg/dL (calc) (ref <130)
TRIGLYCERIDES: 297 mg/dL — AB (ref <150)
Total CHOL/HDL Ratio: 3.8 (calc) (ref <5.0)

## 2018-04-19 LAB — MICROALBUMIN / CREATININE URINE RATIO
CREATININE, URINE: 70 mg/dL (ref 20–320)
MICROALB UR: 0.4 mg/dL
MICROALB/CREAT RATIO: 6 ug/mg{creat} (ref <30)

## 2018-04-19 LAB — URINE CULTURE
MICRO NUMBER:: 90837175
Result:: NO GROWTH
SPECIMEN QUALITY:: ADEQUATE

## 2018-04-19 LAB — INSULIN, RANDOM: INSULIN: 22.6 u[IU]/mL — AB (ref 2.0–19.6)

## 2018-04-19 LAB — PSA: PSA: 2 ng/mL (ref <=4.0)

## 2018-04-19 LAB — MAGNESIUM: Magnesium: 2 mg/dL (ref 1.5–2.5)

## 2018-04-19 LAB — TSH: TSH: 1.55 mIU/L (ref 0.40–4.50)

## 2018-04-19 LAB — VITAMIN D 25 HYDROXY (VIT D DEFICIENCY, FRACTURES): VIT D 25 HYDROXY: 34 ng/mL (ref 30–100)

## 2018-04-19 LAB — URIC ACID: URIC ACID, SERUM: 8.1 mg/dL — AB (ref 4.0–8.0)

## 2018-04-19 MED ORDER — ALLOPURINOL 300 MG PO TABS: ORAL_TABLET | ORAL | 3 refills | Status: DC

## 2018-07-20 DIAGNOSIS — M109 Gout, unspecified: Secondary | ICD-10-CM | POA: Insufficient documentation

## 2018-07-20 NOTE — Progress Notes (Signed)
FOLLOW UP  Assessment and Plan:   Hypertension Well controlled with current medications  Monitor blood pressure at home; patient to call if consistently greater than 130/80 Continue DASH diet.   Reminder to go to the ER if any CP, SOB, nausea, dizziness, severe HA, changes vision/speech, left arm numbness and tingling and jaw pain.  Cholesterol Currently at goal; continue rosuvastatin; diet for trigs discussed, will initiate fenofibrate after discussion with patient Continue low cholesterol diet and exercise.  Check lipid panel.   Diabetes with diabetic chronic kidney disease Continue medication: metformin Continue diet and exercise.  Perform daily foot/skin check, notify office of any concerning changes.  Check A1C  Obesity with co morbidities Long discussion about weight loss, diet, and exercise Recommended diet heavy in fruits and veggies and low in animal meats, cheeses, and dairy products, appropriate calorie intake Discussed ideal weight for height  Patient will work on portions of potatoes, rice, beer, but poorly motivated to lose much weight Will follow up in 3 months  Vitamin D Def Below goal at last visit; he has changed dose continue supplementation to maintain goal of 70-100 Check Vit D level  Gout Continue allopurinol Diet discussed Check uric acid as needed   Continue diet and meds as discussed. Further disposition pending results of labs. Discussed med's effects and SE's.   Over 30 minutes of exam, counseling, chart review, and critical decision making was performed.   Future Appointments  Date Time Provider [place]  10/25/2018  9:30 AM Unk Pinto, MD [facility] None  05/16/2019  9:00 AM Unk Pinto, MD [facility] None    ----------------------------------------------------------------------------------------------------------------------  HPI 67 y.o. male  presents for 3 month follow up on hypertension, cholesterol, diabetes,  obesity and vitamin D deficiency.   BMI is Body mass index is 35.77 kg/m., he has been working on diet and exercise, eats mainly vegetables, manages 6-8 properties/yardword. Admits he likes potatoes and the occasional beer. Drinks 5-6 bottles of water daily.  Wt Readings from Last 3 Encounters:  07/21/18 278 lb 9.6 oz (126.4 kg)  04/18/18 274 lb 3.2 oz (124.4 kg)  01/10/18 275 lb 12.8 oz (125.1 kg)   His blood pressure has been controlled at home, today their BP is BP: 120/80  He does not workout but works intense job. He denies chest pain, shortness of breath, dizziness.   He is on cholesterol medication Rosuvastatin 40 mg every other day and denies myalgias. His cholesterol is at goal. The cholesterol last visit was:   Lab Results  Component Value Date   CHOL 127 04/18/2018   HDL 33 (L) 04/18/2018   LDLCALC 63 04/18/2018   TRIG 297 (H) 04/18/2018   CHOLHDL 3.8 04/18/2018    He has been working on diet and exercise for T2DM well controlled by lifestyle and metformin 1000 mg daily, and denies foot ulcerations, increased appetite, nausea, paresthesia of the feet, polydipsia, polyuria and visual disturbances. He does check sugars around 7 pm and typically runs 100-120. Last A1C in the office was:  Lab Results  Component Value Date   HGBA1C 5.8 (H) 04/18/2018   Patient is on Vitamin D supplement and increased dose after last visit:  Lab Results  Component Value Date   VD25OH 34 04/18/2018     Patient is on allopurinol for gout (reports he takes intermittently) and does not report a recent flare.  Lab Results  Component Value Date   LABURIC 8.1 (H) 04/18/2018     Current Medications:  Current Outpatient  Medications on File Prior to Visit  Medication Sig  . ACCU-CHEK SOFTCLIX LANCETS lancets Use once daily with Accu-Chek meter to check BS reading. Dx. E11.9  . allopurinol (ZYLOPRIM) 300 MG tablet Take 1 tablet daily to prevent Gout Attack  . Blood Glucose Monitoring Suppl  (ACCU-CHEK AVIVA PLUS) w/Device KIT Use daily to check BS.  Dx. E11.9  . Cholecalciferol (VITAMIN D PO) Take 1,000 Units by mouth daily.  . diphenhydramine-acetaminophen (TYLENOL PM) 25-500 MG TABS tablet Take 1 tablet by mouth at bedtime as needed.  . gabapentin (NEURONTIN) 100 MG capsule Take 1-2 capsules at bedtime.  Marland Kitchen glucose blood test strip Use once daily with Accu-Chek meter to check BS. Dx. E11.9  . ibuprofen (ADVIL,MOTRIN) 200 MG tablet Take 800 mg by mouth at bedtime as needed.  Marland Kitchen lisinopril-hydrochlorothiazide (PRINZIDE,ZESTORETIC) 20-25 MG tablet Take 1 tablet by mouth daily.  . metFORMIN (GLUCOPHAGE-XR) 500 MG 24 hr tablet Take 1 to 2 tablets 2 x / day with meals for Diabetes  . Multiple Vitamin (MULTIVITAMIN) tablet Take 1 tablet by mouth daily.  . rosuvastatin (CRESTOR) 40 MG tablet Take 1/2 to 1 tablet daily or as directed for Cholesterol (Patient taking differently: Take one tablet every other day)   No current facility-administered medications on file prior to visit.      Allergies: No Known Allergies   Medical History:  Past Medical History:  Diagnosis Date  . Diabetes mellitus without complication ([place]) 4010  . Hyperlipidemia 2017  . Hypertension 2010   Family history- Reviewed and unchanged Social history- Reviewed and unchanged   Review of Systems:  Review of Systems  Constitutional: Negative for malaise/fatigue and weight loss.  HENT: Negative for hearing loss and tinnitus.   Eyes: Negative for blurred vision and double vision.  Respiratory: Negative for cough, shortness of breath and wheezing.   Cardiovascular: Negative for chest pain, palpitations, orthopnea, claudication and leg swelling.  Gastrointestinal: Negative for abdominal pain, blood in stool, constipation, diarrhea, heartburn, melena, nausea and vomiting.  Genitourinary: Negative.   Musculoskeletal: Negative for joint pain and myalgias.  Skin: Negative for rash.  Neurological: Negative for  dizziness, tingling, sensory change, weakness and headaches.  Endo/Heme/Allergies: Negative for polydipsia.  Psychiatric/Behavioral: Negative.   All other systems reviewed and are negative.     Physical Exam: BP 120/80   Pulse 69   Temp (!) 97.5 F (36.4 C)   Ht '6\' 2"'$  (1.88 m)   Wt 278 lb 9.6 oz (126.4 kg)   SpO2 98%   BMI 35.77 kg/m  Wt Readings from Last 3 Encounters:  07/21/18 278 lb 9.6 oz (126.4 kg)  04/18/18 274 lb 3.2 oz (124.4 kg)  01/10/18 275 lb 12.8 oz (125.1 kg)   General Appearance: Well nourished, in no apparent distress. Eyes: PERRLA, EOMs, conjunctiva no swelling or erythema Sinuses: No Frontal/maxillary tenderness ENT/Mouth: Ext aud canals clear, TMs without erythema, bulging. No erythema, swelling, or exudate on post pharynx.  Tonsils not swollen or erythematous. Hearing normal.  Neck: Supple, thyroid normal.  Respiratory: Respiratory effort normal, BS equal bilaterally without rales, rhonchi, wheezing or stridor.  Cardio: RRR with no MRGs. Brisk peripheral pulses without edema.  Abdomen: Soft, + BS.  Non tender, no guarding, rebound, hernias, masses. Lymphatics: Non tender without lymphadenopathy.  Musculoskeletal: Full ROM, 5/5 strength, Normal gait Skin: Warm, dry without rashes, lesions, ecchymosis.  Neuro: Cranial nerves intact. No cerebellar symptoms.  Psych: Awake and oriented X 3, normal affect, Insight and Judgment appropriate.  Izora Ribas, NP 8:51 AM [facility]

## 2018-07-21 ENCOUNTER — Encounter: Payer: Self-pay | Admitting: Adult Health

## 2018-07-21 ENCOUNTER — Ambulatory Visit (INDEPENDENT_AMBULATORY_CARE_PROVIDER_SITE_OTHER): Payer: Medicare HMO | Admitting: Adult Health

## 2018-07-21 VITALS — BP 120/80 | HR 69 | Temp 97.5°F | Ht 74.0 in | Wt 278.6 lb

## 2018-07-21 DIAGNOSIS — Z1211 Encounter for screening for malignant neoplasm of colon: Secondary | ICD-10-CM

## 2018-07-21 DIAGNOSIS — R945 Abnormal results of liver function studies: Secondary | ICD-10-CM | POA: Diagnosis not present

## 2018-07-21 DIAGNOSIS — N181 Chronic kidney disease, stage 1: Secondary | ICD-10-CM | POA: Diagnosis not present

## 2018-07-21 DIAGNOSIS — E1169 Type 2 diabetes mellitus with other specified complication: Secondary | ICD-10-CM | POA: Diagnosis not present

## 2018-07-21 DIAGNOSIS — E785 Hyperlipidemia, unspecified: Secondary | ICD-10-CM | POA: Diagnosis not present

## 2018-07-21 DIAGNOSIS — I10 Essential (primary) hypertension: Secondary | ICD-10-CM | POA: Diagnosis not present

## 2018-07-21 DIAGNOSIS — Z79899 Other long term (current) drug therapy: Secondary | ICD-10-CM | POA: Diagnosis not present

## 2018-07-21 DIAGNOSIS — E1122 Type 2 diabetes mellitus with diabetic chronic kidney disease: Secondary | ICD-10-CM

## 2018-07-21 DIAGNOSIS — E669 Obesity, unspecified: Secondary | ICD-10-CM

## 2018-07-21 DIAGNOSIS — M1A9XX0 Chronic gout, unspecified, without tophus (tophi): Secondary | ICD-10-CM

## 2018-07-21 DIAGNOSIS — E559 Vitamin D deficiency, unspecified: Secondary | ICD-10-CM

## 2018-07-21 DIAGNOSIS — Z23 Encounter for immunization: Secondary | ICD-10-CM | POA: Diagnosis not present

## 2018-07-21 MED ORDER — TRIAMCINOLONE ACETONIDE 0.1 % EX OINT: 1.0000 "application " | TOPICAL_OINTMENT | Freq: Two times a day (BID) | CUTANEOUS | 1 refills | Status: DC

## 2018-07-21 MED ORDER — FENOFIBRATE 134 MG PO CAPS: 134.0000 mg | ORAL_CAPSULE | Freq: Every day | ORAL | 1 refills | Status: DC

## 2018-07-21 NOTE — Patient Instructions (Addendum)
Goals    . HEMOGLOBIN A1C < 5.7    . Weight (lb) < 270 lb (122.5 kg)       Goal for triglycerides: <150 Goal for vitamin D: 70-100   Know what a healthy weight is for you (roughly BMI <25) and aim to maintain this  Aim for 7+ servings of fruits and vegetables daily  65-80+ fluid ounces of water or unsweet tea for healthy kidneys  Limit to max 1 drink of alcohol per day; avoid smoking/tobacco  Limit animal fats in diet for cholesterol and heart health - choose grass fed whenever available  Avoid highly processed foods, and foods high in saturated/trans fats  Aim for low stress - take time to unwind and care for your mental health  Aim for 150 min of moderate intensity exercise weekly for heart health, and weights twice weekly for bone health  Aim for 7-9 hours of sleep daily    Food Choices to Lower Your Triglycerides Triglycerides are a type of fat in your blood. High levels of triglycerides can increase the risk of heart disease and stroke. If your triglyceride levels are high, the foods you eat and your eating habits are very important. Choosing the right foods can help lower your triglycerides. What general guidelines do I need to follow?  Lose weight if you are overweight.  Limit or avoid alcohol.  Fill one half of your plate with vegetables and green salads.  Limit fruit to two servings a day. Choose fruit instead of juice.  Make one fourth of your plate whole grains. Look for the word "whole" as the first word in the ingredient list.  Fill one fourth of your plate with lean protein foods.  Enjoy fatty fish (such as salmon, mackerel, sardines, and tuna) three times a week.  Choose healthy fats.  Limit foods high in starch and sugar.  Eat more home-cooked food and less restaurant, buffet, and fast food.  Limit fried foods.  Cook foods using methods other than frying.  Limit saturated fats.  Check ingredient lists to avoid foods with partially  hydrogenated oils (trans fats) in them. What foods can I eat? Grains Whole grains, such as whole wheat or whole grain breads, crackers, cereals, and pasta. Unsweetened oatmeal, bulgur, barley, quinoa, or brown rice. Corn or whole wheat flour tortillas. Vegetables Fresh or frozen vegetables (raw, steamed, roasted, or grilled). Green salads. Fruits All fresh, canned (in natural juice), or frozen fruits. Meat and Other Protein Products Ground beef (85% or leaner), grass-fed beef, or beef trimmed of fat. Skinless chicken or [place]. Ground chicken or [place]. Pork trimmed of fat. All fish and seafood. Eggs. Dried beans, peas, or lentils. Unsalted nuts or seeds. Unsalted canned or dry beans. Dairy Low-fat dairy products, such as skim or 1% milk, 2% or reduced-fat cheeses, low-fat ricotta or cottage cheese, or plain low-fat yogurt. Fats and Oils Tub margarines without trans fats. Light or reduced-fat mayonnaise and salad dressings. Avocado. Safflower, olive, or canola oils. Natural peanut or almond butter. The items listed above may not be a complete list of recommended foods or beverages. Contact your dietitian for more options. What foods are not recommended? Grains White bread. White pasta. White rice. Cornbread. Bagels, pastries, and croissants. Crackers that contain trans fat. Vegetables White potatoes. Corn. Creamed or fried vegetables. Vegetables in a cheese sauce. Fruits Dried fruits. Canned fruit in light or heavy syrup. Fruit juice. Meat and Other Protein Products Fatty cuts of meat. Ribs, chicken wings, bacon, sausage,  bologna, salami, chitterlings, fatback, hot dogs, bratwurst, and packaged luncheon meats. Dairy Whole or 2% milk, cream, half-and-half, and cream cheese. Whole-fat or sweetened yogurt. Full-fat cheeses. Nondairy creamers and whipped toppings. Processed cheese, cheese spreads, or cheese curds. Sweets and Desserts Corn syrup, sugars, honey, and molasses. Candy. Jam and  jelly. Syrup. Sweetened cereals. Cookies, pies, cakes, donuts, muffins, and ice cream. Fats and Oils Butter, stick margarine, lard, shortening, ghee, or bacon fat. Coconut, palm kernel, or palm oils. Beverages Alcohol. Sweetened drinks (such as sodas, lemonade, and fruit drinks or punches). The items listed above may not be a complete list of foods and beverages to avoid. Contact your dietitian for more information. This information is not intended to replace advice given to you by your health care provider. Make sure you discuss any questions you have with your health care provider. Document Released: 07/09/2004 Document Revised: 02/27/2016 Document Reviewed: 07/26/2013 Elsevier Interactive Patient Education  2017 Elsevier Inc.    Fenofibric Acid capsules What is this medicine? FENOFIBRIC ACID (fen o FI brik AS id) can help lower blood fats and cholesterol in the blood. Diet and lifestyle changes are often used with this drug. This medicine is only for patients whose blood fats are not controlled by diet. This medicine may be used for other purposes; ask your health care provider or pharmacist if you have questions. COMMON BRAND NAME(S): Trilipix What should I tell my health care provider before I take this medicine? They need to know if you have any of these conditions: -gallbladder disease -heart disease -kidney disease -liver disease -an unusual or allergic reaction to fenofibric acid, fenofibrate, other medicines, foods, dyes, or preservatives -pregnant or trying to get pregnant -breast-feeding How should I use this medicine? Take this medicine by mouth with a glass of water. Follow the directions on the prescription label. You can take this medicine with or without food. If it upsets your stomach, take it with food. You should be on a low fat and low cholesterol diet while you take this medicine. Do not cut, crush or chew this medicine. Take your medicine at regular intervals. Do  not take it more often than directed. Do not stop taking except on your doctor's advice. A special MedGuide will be given to you by the pharmacist with each prescription and refill. Be sure to read this information carefully each time. Talk to your pediatrician regarding the use of this medicine in children. Special care may be needed. Overdosage: If you think you have taken too much of this medicine contact a poison control center or emergency room at once. NOTE: This medicine is only for you. Do not share this medicine with others. What if I miss a dose? If you miss a dose, take it as soon as you can. If it is almost time for your next dose, take only that dose. Do not take double or extra doses. What may interact with this medicine? This medicine may interact with the following medications: -bile acid resins like cholestyramine, colesevelam, and colestipol -certain medicines for cholesterol like atorvastatin, lovastatin, and simvastatin -certain medicines for diabetes, like glipizide or glyburide -certain medicines that suppress the body's immune response like cyclosporine and tacrolimus -certain medicines that treat or prevent blood clots like warfarin -colchicine -ezetimibe -supplements like red yeast rice This list may not describe all possible interactions. Give your health care provider a list of all the medicines, herbs, non-prescription drugs, or dietary supplements you use. Also tell them if you smoke, drink alcohol,  or use illegal drugs. Some items may interact with your medicine. What should I watch for while using this medicine? Visit your doctor or health care professional for regular checks on your progress. Your blood fats and other tests will be measured from time to time. This medicine is only part of a total cholesterol-lowering program. Your health care professional or dietician can suggest a low-cholesterol and low-fat diet that will reduce your risk of getting heart and  blood vessel disease. Avoid alcohol and smoking, and keep a proper exercise schedule. If you are diabetic, close regulation and monitoring of your blood sugars can help your blood fat levels. This medicine may change the way your diabetic medication works, and sometimes will require that your dosages be adjusted. Check with your doctor or health care professional. This medicine can make you more sensitive to the sun. Keep out of the sun. If you cannot avoid being in the sun, wear protective clothing and use sunscreen. Do not use sun lamps or tanning beds/booths. What side effects may I notice from receiving this medicine? Side effects that you should report to your doctor or health care professional as soon as possible: -allergic reactions like skin rash, itching or hives, swelling of the face, lips, or tongue -dark urine -fever -lower back or side pain -muscle pain, tenderness, or weakness -skin-bruising -stomach pain -trouble passing urine or change in the amount of urine -unusually weak or tired -yellowing of the eyes or skin Side effects that usually do not require medical attention (report to your doctor or health care professional if they continue or are bothersome): -constipation -headache -nausea This list may not describe all possible side effects. Call your doctor for medical advice about side effects. You may report side effects to FDA at 1-800-FDA-1088. Where should I keep my medicine? Keep out of the reach of children. Store at room temperature between 15 and 30 degrees C (59 and 86 degrees F). Throw away any unused medicine after the expiration date. NOTE: This sheet is a summary. It may not cover all possible information. If you have questions about this medicine, talk to your doctor, pharmacist, or health care provider.  2018 Elsevier/Gold Standard (2016-04-13 09:17:36)

## 2018-07-22 ENCOUNTER — Encounter: Payer: Self-pay | Admitting: Adult Health

## 2018-07-22 DIAGNOSIS — R945 Abnormal results of liver function studies: Secondary | ICD-10-CM

## 2018-07-22 DIAGNOSIS — R7989 Other specified abnormal findings of blood chemistry: Secondary | ICD-10-CM | POA: Insufficient documentation

## 2018-07-26 LAB — COMPLETE METABOLIC PANEL WITH GFR
AG Ratio: 1.6 (calc) (ref 1.0–2.5)
ALBUMIN MSPROF: 4.7 g/dL (ref 3.6–5.1)
ALT: 67 U/L — ABNORMAL HIGH (ref 9–46)
AST: 42 U/L — AB (ref 10–35)
Alkaline phosphatase (APISO): 55 U/L (ref 40–115)
BUN: 14 mg/dL (ref 7–25)
CALCIUM: 10 mg/dL (ref 8.6–10.3)
CO2: 26 mmol/L (ref 20–32)
CREATININE: 0.89 mg/dL (ref 0.70–1.25)
Chloride: 100 mmol/L (ref 98–110)
GFR, EST NON AFRICAN AMERICAN: 89 mL/min/{1.73_m2} (ref >=60)
GFR, Est African American: 103 mL/min/{1.73_m2} (ref >=60)
GLUCOSE: 92 mg/dL (ref 65–99)
Globulin: 2.9 g/dL (calc) (ref 1.9–3.7)
Potassium: 3.9 mmol/L (ref 3.5–5.3)
SODIUM: 137 mmol/L (ref 135–146)
Total Bilirubin: 0.5 mg/dL (ref 0.2–1.2)
Total Protein: 7.6 g/dL (ref 6.1–8.1)

## 2018-07-26 LAB — TSH: TSH: 1.6 mIU/L (ref 0.40–4.50)

## 2018-07-26 LAB — GAMMA GT: GGT: 93 U/L — AB (ref 3–70)

## 2018-07-26 LAB — CBC WITH DIFFERENTIAL/PLATELET
BASOS ABS: 92 {cells}/uL (ref 0–200)
Basophils Relative: 1.5 %
Eosinophils Absolute: 61 cells/uL (ref 15–500)
Eosinophils Relative: 1 %
HEMATOCRIT: 41.2 % (ref 38.5–50.0)
Hemoglobin: 14.1 g/dL (ref 13.2–17.1)
LYMPHS ABS: 1617 {cells}/uL (ref 850–3900)
MCH: 31.7 pg (ref 27.0–33.0)
MCHC: 34.2 g/dL (ref 32.0–36.0)
MCV: 92.6 fL (ref 80.0–100.0)
MPV: 10.8 fL (ref 7.5–12.5)
Monocytes Relative: 10.8 %
NEUTROS PCT: 60.2 %
Neutro Abs: 3672 cells/uL (ref 1500–7800)
PLATELETS: 259 10*3/uL (ref 140–400)
RBC: 4.45 10*6/uL (ref 4.20–5.80)
RDW: 12.1 % (ref 11.0–15.0)
TOTAL LYMPHOCYTE: 26.5 %
WBC mixed population: 659 cells/uL (ref 200–950)
WBC: 6.1 10*3/uL (ref 3.8–10.8)

## 2018-07-26 LAB — LIPID PANEL
CHOL/HDL RATIO: 4.6 (calc) (ref <5.0)
Cholesterol: 139 mg/dL (ref <200)
HDL: 30 mg/dL — ABNORMAL LOW (ref >40)
LDL CHOLESTEROL (CALC): 72 mg/dL
Non-HDL Cholesterol (Calc): 109 mg/dL (calc) (ref <130)
Triglycerides: 278 mg/dL — ABNORMAL HIGH (ref <150)

## 2018-07-26 LAB — VITAMIN D 25 HYDROXY (VIT D DEFICIENCY, FRACTURES): Vit D, 25-Hydroxy: 57 ng/mL (ref 30–100)

## 2018-07-26 LAB — TEST AUTHORIZATION

## 2018-07-26 LAB — TEST AUTHORIZATION 2

## 2018-07-26 LAB — IRON,TIBC AND FERRITIN PANEL
%SAT: 37 % (calc) (ref 20–48)
FERRITIN: 671 ng/mL — AB (ref 24–380)
Iron: 89 ug/dL (ref 50–180)
TIBC: 241 ug/dL — AB (ref 250–425)

## 2018-07-26 LAB — MAGNESIUM: MAGNESIUM: 1.8 mg/dL (ref 1.5–2.5)

## 2018-07-26 LAB — HEMOGLOBIN A1C
HEMOGLOBIN A1C: 6 %{Hb} — AB (ref <5.7)
MEAN PLASMA GLUCOSE: 126 (calc)
eAG (mmol/L): 7 (calc)

## 2018-08-05 ENCOUNTER — Other Ambulatory Visit: Payer: Self-pay | Admitting: *Deleted

## 2018-08-05 DIAGNOSIS — Z79899 Other long term (current) drug therapy: Secondary | ICD-10-CM

## 2018-08-05 MED ORDER — GABAPENTIN 100 MG PO CAPS: ORAL_CAPSULE | ORAL | 1 refills | Status: DC

## 2018-08-15 DIAGNOSIS — Z1211 Encounter for screening for malignant neoplasm of colon: Secondary | ICD-10-CM | POA: Diagnosis not present

## 2018-08-16 LAB — COLOGUARD: Cologuard: NEGATIVE

## 2018-09-12 NOTE — Progress Notes (Signed)
Patient completed test, waiting on results.

## 2018-09-27 ENCOUNTER — Other Ambulatory Visit: Payer: Self-pay | Admitting: Internal Medicine

## 2018-09-27 DIAGNOSIS — I10 Essential (primary) hypertension: Secondary | ICD-10-CM

## 2018-10-24 ENCOUNTER — Encounter: Payer: Self-pay | Admitting: Internal Medicine

## 2018-10-24 NOTE — Patient Instructions (Signed)

## 2018-10-24 NOTE — Progress Notes (Signed)
This very nice 68 y.o. MWM presents for 6 month follow up with HTN, HLD, T2_NIDDM and Vitamin D Deficiency. Patient has Gout controlled with his meds.     Patient is treated for HTN (2010) & BP has been controlled at home. Today's BP is at goal - 124/82. Patient has had no complaints of any cardiac type chest pain, palpitations, dyspnea / orthopnea / PND, dizziness, claudication, or dependent edema.     Hyperlipidemia is controlled with diet & meds. Patient denies myalgias or other med SE's. Last Lipids were at goal albeit elevated Trig's: Lab Results  Component Value Date   CHOL 139 07/21/2018   HDL 30 (L) 07/21/2018   LDLCALC 72 07/21/2018   TRIG 278 (H) 07/21/2018   CHOLHDL 4.6 07/21/2018      Also, the patient has history of T2_NIDDM /CKD2  (June 2017) and has had no symptoms of reactive hypoglycemia, diabetic polys, paresthesias or visual blurring.  Patient is on Metformin and last A1c was near goal: Lab Results  Component Value Date   HGBA1C 6.0 (H) 07/21/2018      Further, the patient also has history of Vitamin D Deficiency("23" / Mar 2018)  and supplements vitamin D without any suspected side-effects. Last vitamin D was at goal: Lab Results  Component Value Date   VD25OH 57 07/21/2018   Current Outpatient Medications on File Prior to Visit  Medication Sig  . ACCU-CHEK SOFTCLIX LANCETS lancets Use once daily with Accu-Chek meter to check BS reading. Dx. E11.9  . allopurinol (ZYLOPRIM) 300 MG tablet Take 1 tablet daily to prevent Gout Attack  . Blood Glucose Monitoring Suppl (ACCU-CHEK AVIVA PLUS) w/Device KIT Use daily to check BS.  Dx. E11.9  . Cholecalciferol (VITAMIN D PO) Take 1,000 Units by mouth daily.  . diphenhydramine-acetaminophen (TYLENOL PM) 25-500 MG TABS tablet Take 1 tablet by mouth at bedtime as needed.  . gabapentin (NEURONTIN) 100 MG capsule Take 1-2 capsules at bedtime.  Marland Kitchen glucose blood test strip Use once daily with Accu-Chek meter to check BS. Dx.  E11.9  . ibuprofen (ADVIL,MOTRIN) 200 MG tablet Take 800 mg by mouth at bedtime as needed.  Marland Kitchen lisinopril-hydrochlorothiazide (PRINZIDE,ZESTORETIC) 20-25 MG tablet TAKE 1 TABLET EVERY DAY  . Multiple Vitamin (MULTIVITAMIN) tablet Take 1 tablet by mouth daily.  . rosuvastatin (CRESTOR) 40 MG tablet Take 1/2 to 1 tablet daily or as directed for Cholesterol (Patient taking differently: Take one tablet every other day)  . triamcinolone ointment (KENALOG) 0.1 % Apply 1 application topically 2 (two) times daily.  . metFORMIN (GLUCOPHAGE-XR) 500 MG 24 hr tablet Take 1 to 2 tablets 2 x / day with meals for Diabetes   No current facility-administered medications on file prior to visit.    No Known Allergies   PMHx:   Past Medical History:  Diagnosis Date  . Diabetes mellitus without complication ([place]) 9562  . Hyperlipidemia 2017  . Hypertension 2010   Immunization History  Administered Date(s) Administered  . Influenza, High Dose Seasonal PF 06/24/2017, 07/21/2018  . Pneumococcal Conjugate-13 04/18/2018  . Tdap 10/14/2015   History reviewed. No pertinent surgical history. FHx:    Reviewed / unchanged  SHx:    Reviewed / unchanged   Systems Review:  Constitutional: Denies fever, chills, wt changes, headaches, insomnia, fatigue, night sweats, change in appetite. Eyes: Denies redness, blurred vision, diplopia, discharge, itchy, watery eyes.  ENT: Denies discharge, congestion, post nasal drip, epistaxis, sore throat, earache, hearing loss, dental pain,  tinnitus, vertigo, sinus pain, snoring.  CV: Denies chest pain, palpitations, irregular heartbeat, syncope, dyspnea, diaphoresis, orthopnea, PND, claudication or edema. Respiratory: denies cough, dyspnea, DOE, pleurisy, hoarseness, laryngitis, wheezing.  Gastrointestinal: Denies dysphagia, odynophagia, heartburn, reflux, water brash, abdominal pain or cramps, nausea, vomiting, bloating, diarrhea, constipation, hematemesis, melena, hematochezia   or hemorrhoids. Genitourinary: Denies dysuria, frequency, urgency, nocturia, hesitancy, discharge, hematuria or flank pain. Musculoskeletal: Denies arthralgias, myalgias, stiffness, jt. swelling, pain, limping or strain/sprain.  Skin: Denies pruritus, rash, hives, warts, acne, eczema or change in skin lesion(s). Neuro: No weakness, tremor, incoordination, spasms, paresthesia or pain. Psychiatric: Denies confusion, memory loss or sensory loss. Endo: Denies change in weight, skin or hair change.  Heme/Lymph: No excessive bleeding, bruising or enlarged lymph nodes.  Physical Exam  BP 124/82   Pulse 64   Temp (!) 97.2 F (36.2 C)   Ht '6\' 2"'$  (1.88 m)   Wt 274 lb 6.4 oz (124.5 kg)   SpO2 99%   BMI 35.23 kg/m   Appears  well nourished, well groomed  and in no distress.  Eyes: PERRLA, EOMs, conjunctiva no swelling or erythema. Sinuses: No frontal/maxillary tenderness ENT/Mouth: EAC's clear, TM's nl w/o erythema, bulging. Nares clear w/o erythema, swelling, exudates. Oropharynx clear without erythema or exudates. Oral hygiene is good. Tongue normal, non obstructing. Hearing intact.  Neck: Supple. Thyroid not palpable. Car 2+/2+ without bruits, nodes or JVD. Chest: Respirations nl with BS clear & equal w/o rales, rhonchi, wheezing or stridor.  Cor: Heart sounds normal w/ regular rate and rhythm without sig. murmurs, gallops, clicks or rubs. Peripheral pulses normal and equal  without edema.  Abdomen: Soft & bowel sounds normal. Non-tender w/o guarding, rebound, hernias, masses or organomegaly.  Lymphatics: Unremarkable.  Musculoskeletal: Full ROM all peripheral extremities, joint stability, 5/5 strength and normal gait.  Skin: Warm, dry without exposed rashes, lesions or ecchymosis apparent.  Neuro: Cranial nerves intact, reflexes equal bilaterally. Sensory-motor testing grossly intact. Tendon reflexes grossly intact.  Pysch: Alert & oriented x 3.  Insight and judgement nl & appropriate. No  ideations.  Assessment and Plan:  1. Essential hypertension  - Continue medication, monitor blood pressure at home.  - Continue DASH diet.  Reminder to go to the ER if any CP,  SOB, nausea, dizziness, severe HA, changes vision/speech.  - CBC with Differential/Platelet - COMPLETE METABOLIC PANEL WITH GFR - Magnesium - TSH  2. Hyperlipidemia, mixed  - Continue diet/meds, exercise,& lifestyle modifications.  - Continue monitor periodic cholesterol/liver & renal functions   - Lipid panel - TSH  3. Type 2 diabetes mellitus with stage 2 chronic kidney disease, without long-term current use of insulin (HCC)  - Continue diet, exercise  - Lifestyle modifications.  - Monitor appropriate labs.  - Hemoglobin A1c - Insulin, random  4. Vitamin D deficiency  - Continue supplementation.  - VITAMIN D 25 Hydroxyl  5. Medication management  - CBC with Differential/Platelet - COMPLETE METABOLIC PANEL WITH GFR - Magnesium - Lipid panel - TSH - Hemoglobin A1c - Insulin, random - VITAMIN D 25 Hydroxyl        Discussed  regular exercise, BP monitoring, weight control to achieve/maintain BMI less than 25 and discussed med and SE's. Recommended labs to assess and monitor clinical status with further disposition pending results of labs. Over 30 minutes of exam, counseling, chart review was performed.

## 2018-10-25 ENCOUNTER — Encounter: Payer: Self-pay | Admitting: Internal Medicine

## 2018-10-25 ENCOUNTER — Ambulatory Visit (INDEPENDENT_AMBULATORY_CARE_PROVIDER_SITE_OTHER): Payer: Medicare HMO | Admitting: Internal Medicine

## 2018-10-25 VITALS — BP 124/82 | HR 64 | Temp 97.2°F | Ht 74.0 in | Wt 274.4 lb

## 2018-10-25 DIAGNOSIS — E1122 Type 2 diabetes mellitus with diabetic chronic kidney disease: Secondary | ICD-10-CM

## 2018-10-25 DIAGNOSIS — E559 Vitamin D deficiency, unspecified: Secondary | ICD-10-CM

## 2018-10-25 DIAGNOSIS — Z79899 Other long term (current) drug therapy: Secondary | ICD-10-CM | POA: Diagnosis not present

## 2018-10-25 DIAGNOSIS — E782 Mixed hyperlipidemia: Secondary | ICD-10-CM

## 2018-10-25 DIAGNOSIS — I10 Essential (primary) hypertension: Secondary | ICD-10-CM

## 2018-10-25 DIAGNOSIS — N182 Chronic kidney disease, stage 2 (mild): Secondary | ICD-10-CM

## 2018-10-25 MED ORDER — FENOFIBRATE 134 MG PO CAPS: 134.0000 mg | ORAL_CAPSULE | Freq: Every day | ORAL | 3 refills | Status: DC

## 2018-10-26 LAB — CBC WITH DIFFERENTIAL/PLATELET
ABSOLUTE MONOCYTES: 697 {cells}/uL (ref 200–950)
BASOS ABS: 101 {cells}/uL (ref 0–200)
Basophils Relative: 1.5 %
EOS ABS: 60 {cells}/uL (ref 15–500)
Eosinophils Relative: 0.9 %
HEMATOCRIT: 42.8 % (ref 38.5–50.0)
HEMOGLOBIN: 14.5 g/dL (ref 13.2–17.1)
LYMPHS ABS: 1642 {cells}/uL (ref 850–3900)
MCH: 31.5 pg (ref 27.0–33.0)
MCHC: 33.9 g/dL (ref 32.0–36.0)
MCV: 93 fL (ref 80.0–100.0)
MPV: 10.5 fL (ref 7.5–12.5)
Monocytes Relative: 10.4 %
NEUTROS ABS: 4201 {cells}/uL (ref 1500–7800)
Neutrophils Relative %: 62.7 %
Platelets: 289 10*3/uL (ref 140–400)
RBC: 4.6 10*6/uL (ref 4.20–5.80)
RDW: 11.9 % (ref 11.0–15.0)
Total Lymphocyte: 24.5 %
WBC: 6.7 10*3/uL (ref 3.8–10.8)

## 2018-10-26 LAB — HEMOGLOBIN A1C
Hgb A1c MFr Bld: 6.1 % of total Hgb — ABNORMAL HIGH (ref <5.7)
Mean Plasma Glucose: 128 (calc)
eAG (mmol/L): 7.1 (calc)

## 2018-10-26 LAB — LIPID PANEL
CHOL/HDL RATIO: 4.2 (calc) (ref <5.0)
Cholesterol: 146 mg/dL (ref <200)
HDL: 35 mg/dL — ABNORMAL LOW (ref >40)
LDL Cholesterol (Calc): 77 mg/dL (calc)
NON-HDL CHOLESTEROL (CALC): 111 mg/dL (ref <130)
Triglycerides: 242 mg/dL — ABNORMAL HIGH (ref <150)

## 2018-10-26 LAB — COMPLETE METABOLIC PANEL WITH GFR
AG RATIO: 1.5 (calc) (ref 1.0–2.5)
ALT: 58 U/L — ABNORMAL HIGH (ref 9–46)
AST: 45 U/L — ABNORMAL HIGH (ref 10–35)
Albumin: 4.8 g/dL (ref 3.6–5.1)
Alkaline phosphatase (APISO): 53 U/L (ref 40–115)
BUN: 15 mg/dL (ref 7–25)
CALCIUM: 9.8 mg/dL (ref 8.6–10.3)
CO2: 26 mmol/L (ref 20–32)
Chloride: 101 mmol/L (ref 98–110)
Creat: 1.03 mg/dL (ref 0.70–1.25)
GFR, EST AFRICAN AMERICAN: 87 mL/min/{1.73_m2} (ref >=60)
GFR, EST NON AFRICAN AMERICAN: 75 mL/min/{1.73_m2} (ref >=60)
GLOBULIN: 3.1 g/dL (ref 1.9–3.7)
Glucose, Bld: 89 mg/dL (ref 65–99)
POTASSIUM: 4.3 mmol/L (ref 3.5–5.3)
SODIUM: 138 mmol/L (ref 135–146)
TOTAL PROTEIN: 7.9 g/dL (ref 6.1–8.1)
Total Bilirubin: 0.5 mg/dL (ref 0.2–1.2)

## 2018-10-26 LAB — INSULIN, RANDOM: Insulin: 8.5 u[IU]/mL (ref 2.0–19.6)

## 2018-10-26 LAB — MAGNESIUM: Magnesium: 1.9 mg/dL (ref 1.5–2.5)

## 2018-10-26 LAB — VITAMIN D 25 HYDROXY (VIT D DEFICIENCY, FRACTURES): Vit D, 25-Hydroxy: 39 ng/mL (ref 30–100)

## 2018-10-26 LAB — TSH: TSH: 2.09 mIU/L (ref 0.40–4.50)

## 2018-11-15 ENCOUNTER — Encounter: Payer: Self-pay | Admitting: Internal Medicine

## 2018-11-15 ENCOUNTER — Other Ambulatory Visit: Payer: Self-pay | Admitting: Internal Medicine

## 2018-11-15 MED ORDER — AZITHROMYCIN 250 MG PO TABS: ORAL_TABLET | ORAL | 1 refills | Status: DC

## 2018-11-15 MED ORDER — PREDNISONE 20 MG PO TABS: ORAL_TABLET | ORAL | 0 refills | Status: DC

## 2018-11-21 ENCOUNTER — Telehealth: Payer: Self-pay | Admitting: *Deleted

## 2018-11-21 ENCOUNTER — Other Ambulatory Visit: Payer: Self-pay | Admitting: Internal Medicine

## 2018-11-21 DIAGNOSIS — I10 Essential (primary) hypertension: Secondary | ICD-10-CM

## 2018-11-21 MED ORDER — LOSARTAN POTASSIUM-HCTZ 100-25 MG PO TABS: ORAL_TABLET | ORAL | 3 refills | Status: DC

## 2018-11-21 NOTE — Telephone Encounter (Signed)
Patient called and states he still has a dry cough, even though he has taken 2 rounds of Z-pak. Per Dr Melford Aase, his cough could be due to his Lisinopril-HCTZ and suggest he stop the medication. Dr Melford Aase sent in a new RX for Losartan-HCTZ to replace Lisinopril.  The patient is aware.

## 2018-12-05 ENCOUNTER — Encounter: Payer: Self-pay | Admitting: Internal Medicine

## 2018-12-05 ENCOUNTER — Ambulatory Visit (INDEPENDENT_AMBULATORY_CARE_PROVIDER_SITE_OTHER): Payer: Medicare HMO | Admitting: Internal Medicine

## 2018-12-05 ENCOUNTER — Other Ambulatory Visit: Payer: Self-pay | Admitting: *Deleted

## 2018-12-05 VITALS — BP 124/76 | HR 76 | Temp 97.0°F | Resp 18 | Ht 74.0 in | Wt 283.0 lb

## 2018-12-05 DIAGNOSIS — D485 Neoplasm of uncertain behavior of skin: Secondary | ICD-10-CM

## 2018-12-05 DIAGNOSIS — I10 Essential (primary) hypertension: Secondary | ICD-10-CM | POA: Diagnosis not present

## 2018-12-05 DIAGNOSIS — Z79899 Other long term (current) drug therapy: Secondary | ICD-10-CM

## 2018-12-05 MED ORDER — GABAPENTIN 100 MG PO CAPS: ORAL_CAPSULE | ORAL | 1 refills | Status: DC

## 2018-12-05 NOTE — Progress Notes (Signed)
Subjective:    Patient ID: Roger Mcclain, male    DOB: 30-Apr-1951, 68 y.o.   MRN: [id number]  HPI   Patient is a very nice 68 yo MWM with HTN who denies any sx's of HA's, dizziness, exertional CP, palpitations, dyspnea or dependent edema.  He also ha concerns re: a hypertrophic scar of his Lt elbow that has been exquisitely tender with "bumping" or any pressure and desires to have it removed. He has tried Triamcinolone creams w/o success.   Medication Sig  . ACCU-CHEK SOFTCLIX LANCETS lancets Use once daily with Accu-Chek meter to check BS reading. Dx. E11.9  . allopurinol (ZYLOPRIM) 300 MG tablet Take 1 tablet daily to prevent Gout Attack  . Blood Glucose Monitoring Suppl (ACCU-CHEK AVIVA PLUS) w/Device KIT Use daily to check BS.  Dx. E11.9  . Cholecalciferol (VITAMIN D PO) Take 1,000 Units by mouth daily.  . diphenhydramine-acetaminophen (TYLENOL PM) 25-500 MG TABS tablet Take 1 tablet by mouth at bedtime as needed.  . fenofibrate micronized (LOFIBRA) 134 MG capsule Take 1 capsule (134 mg total) by mouth daily before breakfast.  . glucose blood test strip Use once daily with Accu-Chek meter to check BS. Dx. E11.9  . ibuprofen (ADVIL,MOTRIN) 200 MG tablet Take 800 mg by mouth at bedtime as needed.  Marland Kitchen losartan-hydrochlorothiazide (HYZAAR) 100-25 MG tablet Take 1/2 to 1   tablet daily for BP  . Multiple Vitamin (MULTIVITAMIN) tablet Take 1 tablet by mouth daily.  . rosuvastatin (CRESTOR) 40 MG tablet Take 1/2 to 1 tablet daily or as directed for Cholesterol (Patient taking differently: Take one tablet every other day)  . triamcinolone ointment (KENALOG) 0.1 % Apply 1 application topically 2 (two) times daily.  Marland Kitchen gabapentin (NEURONTIN) 100 MG capsule Take 1-2 capsules at bedtime.  . metFORMIN (GLUCOPHAGE-XR) 500 MG 24 hr tablet Take 1 to 2 tablets 2 x / day with meals for Diabetes  . predniSONE (DELTASONE) 20 MG tablet 1 tab 3 x day for 2 days, then 1 tab 2 x day for 2 days, then 1 tab 1 x day  for 3 days   No facility-administered medications prior to visit.    No Known Allergies Past Medical History:  Diagnosis Date  . Diabetes mellitus without complication ([place]) 8366  . Hyperlipidemia 2017  . Hypertension 2010   Review of Systems   10 point systems review negative except as above.    Objective:   Physical Exam  BP 124/76   Pulse 76   Temp (!) 97 F (36.1 C)   Resp 18   Ht '6\' 2"'$  (1.88 m)   Wt 283 lb (128.4 kg)   BMI 36.34 kg/m   HEENT - WNL. Neck - supple.  Chest - Clear equal BS. Cor - Nl HS. RRR w/o sig MGR. PP 1(+). No edema. MS- FROM w/o deformities.  Gait Nl. Neuro -  Nl w/o focal abnormalities. Skin  Neg except a 15 nn x 25 mm hypertrophic thickening over the Left Olecranon area.   Procedure (CPT : [id number])    After informed consent and aseptic prep with alcohol scrub and infiltration of the lesion with 3 ml of Marcaine 0.5%. The lesion was sharply excised with a #10 scalpel  in an an elliptical fashion deep to the subcut. Then the wound  edges were undermined to allow placement of #4 vertical sutures of Nylon 3-0 to align & approximate the wound edges.  Then a running suture with # 6 lock sutures of Nylon  3-0 were placed to secure the alignment of wound edges. The wound was re cleaned with alcohol and soapy scrub . Then  Non occlusive pads were placed and secured with a 2" ace wrap   Patient was instructed in posy-op wound care.    Assessment & Plan:   1. Essential hypertension  2. Neoplasm of uncertain behavior of skin  - ROV 10 days for partial suture removal.

## 2018-12-15 ENCOUNTER — Other Ambulatory Visit: Payer: Self-pay

## 2018-12-15 ENCOUNTER — Ambulatory Visit: Payer: Medicare HMO | Admitting: Internal Medicine

## 2018-12-15 VITALS — BP 126/80 | HR 72 | Temp 97.1°F | Resp 18 | Ht 74.0 in | Wt 283.2 lb

## 2018-12-15 DIAGNOSIS — D485 Neoplasm of uncertain behavior of skin: Secondary | ICD-10-CM

## 2018-12-17 ENCOUNTER — Encounter: Payer: Self-pay | Admitting: Internal Medicine

## 2018-12-17 NOTE — Progress Notes (Signed)
  Patient returns 12 days s/p excision of a painful scar of his Lt elbow  Wound clean w/adequate healing ridge & no sign of infection.   - all sutures removed and #3 steristrips applied and covered with a 2" x 3" Tegaderm.

## 2019-01-03 ENCOUNTER — Encounter: Payer: Self-pay | Admitting: Internal Medicine

## 2019-02-06 NOTE — Progress Notes (Signed)
MEDICARE ANNUAL WELLNESS VISIT AND FOLLOW UP Assessment:   Encounter for Medicare annual wellness exam 1 year  Type 2 diabetes mellitus with stage 1 chronic kidney disease, without long-term current use of insulin ([place]) -     metFORMIN (GLUCOPHAGE-XR) 500 MG 24 hr tablet; Take 1 tablet (500 mg total) by mouth 2 (two) times daily before a meal. -     Hemoglobin A1c Discussed general issues about diabetes pathophysiology and management., Educational material distributed., Suggested low cholesterol diet., Encouraged aerobic exercise., Discussed foot care., Reminded to get yearly retinal exam. Get eye exam  Hyperlipidemia associated with type 2 diabetes mellitus ([place]) -     Lipid panel check lipids decrease fatty foods increase activity.  Chronic gout without tophus, unspecified cause, unspecified site -     Uric acid Gout- recheck Uric acid as needed, Diet discussed, continue medications.  Elevated LFTs -     COMPLETE METABOLIC PANEL WITH GFR Check labs, avoid tylenol, alcohol, weight loss advised.   Morbid obesity - follow up 3 months for progress monitoring - increase veggies, decrease carbs - long discussion about weight loss, diet, and exercise  Vitamin D deficiency Continue supplement  Essential hypertension -     CBC with Differential/Platelet -     COMPLETE METABOLIC PANEL WITH GFR -     TSH - continue medications, DASH diet, exercise and monitor at home. Call if greater than 130/80.   Medication management -     Magnesium     Over 30 minutes of exam, counseling, chart review, and critical decision making was performed  Future Appointments  Date Time Provider [place]  05/16/2019  9:00 AM Unk Pinto, MD [facility] None     Plan:   During the course of the visit the patient was educated and counseled about appropriate screening and preventive services including:    Pneumococcal vaccine   Influenza vaccine  Prevnar 13  Td  vaccine  Screening electrocardiogram  Colorectal cancer screening  Diabetes screening  Glaucoma screening  Nutrition counseling    Subjective:  Roger Mcclain is a 68 y.o. male who presents for Medicare Annual Wellness Visit and 3 month follow up for HTN, hyperlipidemia, prediabetes, and vitamin D Def.   He has been having sinus pain, bleeding, he is on vicks 12 hours nasal spray which is a decongestant x 1 month.   His blood pressure has been controlled at home, today their BP is BP: 120/80 He does workout, goes to gym 2 x a week for 1 hour. He denies chest pain, shortness of breath, dizziness.  He is on cholesterol medication and denies myalgias. His cholesterol is not at goal. The cholesterol last visit was:   Lab Results  Component Value Date   CHOL 146 10/25/2018   HDL 35 (L) 10/25/2018   LDLCALC 77 10/25/2018   TRIG 242 (H) 10/25/2018   CHOLHDL 4.2 10/25/2018   He has been working on diet and exercise for prediabetes, HAS HISTORY OF DM 2-3 YEARS AGO BUT WITH WEIGHT LOSS NOT IN DM RANGE, he is on metformin, and denies paresthesia of the feet, polydipsia, polyuria and visual disturbances. Last A1C in the office was:  Lab Results  Component Value Date   HGBA1C 6.1 (H) 10/25/2018   Last GFR Lab Results  Component Value Date   GFRNONAA 75 10/25/2018     Lab Results  Component Value Date   GFRAA 87 10/25/2018   Patient is on Vitamin D supplement.   Lab Results  Component Value Date   VD25OH 39 10/25/2018     BMI is Body mass index is 36.59 kg/m., he is working on diet and exercise. Wt Readings from Last 3 Encounters:  02/07/19 285 lb (129.3 kg)  12/15/18 283 lb 3.2 oz (128.5 kg)  12/05/18 283 lb (128.4 kg)    Medication Review: Current Outpatient Medications on File Prior to Visit  Medication Sig Dispense Refill  . ACCU-CHEK SOFTCLIX LANCETS lancets Use once daily with Accu-Chek meter to check BS reading. Dx. E11.9 100 each 12  . allopurinol (ZYLOPRIM)  300 MG tablet Take 1 tablet daily to prevent Gout Attack 90 tablet 3  . Blood Glucose Monitoring Suppl (ACCU-CHEK AVIVA PLUS) w/Device KIT Use daily to check BS.  Dx. E11.9 1 kit 0  . Cholecalciferol (VITAMIN D PO) Take 1,000 Units by mouth daily.    . diphenhydramine-acetaminophen (TYLENOL PM) 25-500 MG TABS tablet Take 1 tablet by mouth at bedtime as needed.    . fenofibrate micronized (LOFIBRA) 134 MG capsule Take 1 capsule (134 mg total) by mouth daily before breakfast. 90 capsule 3  . gabapentin (NEURONTIN) 100 MG capsule Take 1-2 capsules at bedtime. 180 capsule 1  . glucose blood test strip Use once daily with Accu-Chek meter to check BS. Dx. E11.9 100 each 12  . ibuprofen (ADVIL,MOTRIN) 200 MG tablet Take 800 mg by mouth at bedtime as needed.    Marland Kitchen losartan-hydrochlorothiazide (HYZAAR) 100-25 MG tablet Take 1/2 to 1   tablet daily for BP 90 tablet 3  . Multiple Vitamin (MULTIVITAMIN) tablet Take 1 tablet by mouth daily.    Marland Kitchen OVER THE COUNTER MEDICATION OTC Flonase 2 sprays per nostril every evening    . rosuvastatin (CRESTOR) 40 MG tablet Take 1/2 to 1 tablet daily or as directed for Cholesterol (Patient taking differently: Take one tablet every other day) 90 tablet 3  . metFORMIN (GLUCOPHAGE-XR) 500 MG 24 hr tablet Take 1 to 2 tablets 2 x / day with meals for Diabetes 360 tablet 1   No current facility-administered medications on file prior to visit.     Allergies: No Known Allergies  Current Problems (verified) has Diabetes mellitus ([place]); Hypertension; Hyperlipidemia associated with type 2 diabetes mellitus ([place]); Vitamin D deficiency; Obesity (BMI 30.0-34.9); Gout; and Elevated LFTs on their problem list.  Screening Tests Immunization History  Administered Date(s) Administered  . Influenza, High Dose Seasonal PF 06/24/2017, 07/21/2018  . Pneumococcal Conjugate-13 04/18/2018  . Tdap 10/14/2015    Preventative care: Last colonoscopy: COLOGUARD- will check CT head  2001  Prior vaccinations: TD or Tdap: 2017  Influenza: 2018  Pneumococcal: Due 04/2018 Prevnar13: 2019 Shingles/Zostavax: N/A  Names of Other Physician/Practitioners you currently use: 1. [facility] here for primary care 2. [facility], eye doctor, last visit 01/02/2018 Patient Care Team: Unk Pinto, MD as PCP - General (Internal Medicine)  Surgical: He  has no past surgical history on file. Family His family history includes Diabetes in his father; Heart disease in his mother; Hyperlipidemia in his father; Hypertension in his father and mother. Social history  He reports that he has never smoked. He has never used smokeless tobacco. He reports current alcohol use. He reports that he does not use drugs.  MEDICARE WELLNESS OBJECTIVES: Physical activity:   Cardiac risk factors:   Depression/mood screen:   Depression screen [facility] 2/9 10/24/2018  Decreased Interest 0  Down, Depressed, Hopeless 0  PHQ - 2 Score 0    ADLs:  In your present state of health, do you have any difficulty performing the following activities: 10/24/2018 04/18/2018  Hearing? N N  Vision? N N  Difficulty concentrating or making decisions? N N  Walking or climbing stairs? N N  Dressing or bathing? N N  Doing errands, shopping? N N  Some recent data might be hidden     Cognitive Testing  Alert? Yes  Normal Appearance?Yes  Oriented to person? Yes  Place? Yes   Time? Yes  Recall of three objects?  Yes  Can perform simple calculations? Yes  Displays appropriate judgment?Yes  Can read the correct time from a watch face?Yes  EOL planning: Does Patient Have a Medical Advance Directive?: No Would patient like information on creating a medical advance directive?: Yes (MAU/Ambulatory/Procedural Areas - Information given)   Objective:   Today's Vitals   02/07/19 0928  BP: 120/80  Pulse: 82  Temp: 97.6 F (36.4 C)  SpO2: 97%  Weight: 285 lb (129.3 kg)   Height: 6' 2" (1.88 m)   Body mass index is 36.59 kg/m.  General appearance: alert, no distress, WD/WN, male HEENT: normocephalic, sclerae anicteric, TMs pearly, nares patent, no discharge or erythema, pharynx normal Oral cavity: MMM, no lesions Neck: supple, no lymphadenopathy, no thyromegaly, no masses Heart: RRR, normal S1, S2, no murmurs Lungs: CTA bilaterally, no wheezes, rhonchi, or rales Abdomen: +bs, soft, non tender, non distended, no masses, no hepatomegaly, no splenomegaly Musculoskeletal: nontender, no swelling, no obvious deformity Extremities: no edema, no cyanosis, no clubbing Pulses: 2+ symmetric, upper and lower extremities, normal cap refill Neurological: alert, oriented x 3, CN2-12 intact, strength normal upper extremities and lower extremities, sensation normal throughout, DTRs 2+ throughout, no cerebellar signs, gait normal Psychiatric: normal affect, behavior normal, pleasant   Medicare Attestation I have personally reviewed: The patient's medical and social history Their use of alcohol, tobacco or illicit drugs Their current medications and supplements The patient's functional ability including ADLs,fall risks, home safety risks, cognitive, and hearing and visual impairment Diet and physical activities Evidence for depression or mood disorders  The patient's weight, height, BMI, and visual acuity have been recorded in the chart.  I have made referrals, counseling, and provided education to the patient based on review of the above and I have provided the patient with a written personalized care plan for preventive services.     Vicie Mutters, PA-C   02/07/2019

## 2019-02-07 ENCOUNTER — Encounter: Payer: Self-pay | Admitting: Physician Assistant

## 2019-02-07 ENCOUNTER — Other Ambulatory Visit: Payer: Self-pay

## 2019-02-07 ENCOUNTER — Ambulatory Visit (INDEPENDENT_AMBULATORY_CARE_PROVIDER_SITE_OTHER): Payer: Medicare HMO | Admitting: Physician Assistant

## 2019-02-07 VITALS — BP 120/80 | HR 82 | Temp 97.6°F | Ht 74.0 in | Wt 285.0 lb

## 2019-02-07 DIAGNOSIS — R6889 Other general symptoms and signs: Secondary | ICD-10-CM

## 2019-02-07 DIAGNOSIS — M1A9XX0 Chronic gout, unspecified, without tophus (tophi): Secondary | ICD-10-CM

## 2019-02-07 DIAGNOSIS — Z79899 Other long term (current) drug therapy: Secondary | ICD-10-CM

## 2019-02-07 DIAGNOSIS — E785 Hyperlipidemia, unspecified: Secondary | ICD-10-CM | POA: Diagnosis not present

## 2019-02-07 DIAGNOSIS — E1169 Type 2 diabetes mellitus with other specified complication: Secondary | ICD-10-CM | POA: Diagnosis not present

## 2019-02-07 DIAGNOSIS — I10 Essential (primary) hypertension: Secondary | ICD-10-CM

## 2019-02-07 DIAGNOSIS — Z0001 Encounter for general adult medical examination with abnormal findings: Secondary | ICD-10-CM

## 2019-02-07 DIAGNOSIS — R7989 Other specified abnormal findings of blood chemistry: Secondary | ICD-10-CM

## 2019-02-07 DIAGNOSIS — E1122 Type 2 diabetes mellitus with diabetic chronic kidney disease: Secondary | ICD-10-CM

## 2019-02-07 DIAGNOSIS — E559 Vitamin D deficiency, unspecified: Secondary | ICD-10-CM

## 2019-02-07 DIAGNOSIS — R945 Abnormal results of liver function studies: Secondary | ICD-10-CM | POA: Diagnosis not present

## 2019-02-07 DIAGNOSIS — N181 Chronic kidney disease, stage 1: Secondary | ICD-10-CM | POA: Diagnosis not present

## 2019-02-07 DIAGNOSIS — Z0000 Encounter for general adult medical examination without abnormal findings: Secondary | ICD-10-CM

## 2019-02-07 MED ORDER — METFORMIN HCL ER 500 MG PO TB24: 500.0000 mg | ORAL_TABLET | Freq: Two times a day (BID) | ORAL | 3 refills | Status: DC

## 2019-02-07 NOTE — Patient Instructions (Addendum)
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   This can decrease your blood pressure so please contact us if you have any dizziness, sometime we need to decrease or stop fluid pills or decrease BP meds.  You are peeing out 300-400 calories a day of sugar which can lead to yeast infections, you can take the diflucan as needed but if the infections continue we will stop the medications.  If you get any pain or discomfort around your buttocks or genitals please let us know if it does not go away. We will have you follow up in 1 month to check your kidney function and weight. Call if you need anything.    Diabetes or even increased sugars put you at 300% increased risk of heart attack and stroke.  ALSO BEING DIABETIC YOU MAY NOT HAVE ANY PAIN WITH A HEART ATTACK.  Even worse of a chance of no pain if you are a woman.  It is very unlikely that you will have any pain with a heart attack. Likely your symptoms will be very subtle, even for very severe disease.  Your symptoms for a heart attack will likely occur when you exert your self or exercise and include: Shortness of breath Sweating Nausea Dizziness Fast or irregular heart beats Fatigue   It makes me feel better if my diabetics get their heart rate up with exercise once or twice a week and pay close attention to your body. If there is ANY change in your exercise capacity or if you have symptoms above, please STOP and call 911 or call to come to the office.   PLEASE REMEMBER:  Diabetes is preventable! Up to 62 percent of complications and morbidities among individuals with type 2 diabetes can be prevented, delayed, or effectively  treated and minimized with regular visits to a health professional, appropriate monitoring and medication, and a healthy diet and lifestyle.   Here is some information to help you keep your heart healthy: Move it! - Aim for 30 mins of activity every day. Take it slowly at first. Talk to [place] before starting any new exercise program.   Lose it.  -Body Mass Index (BMI) can indicate if you need to lose weight. A healthy range is 18.5-24.9. For a BMI calculator, go to [employer]  Waist Management -Excess abdominal fat is a risk factor for heart disease, diabetes, asthma, stroke and more. Ideal waist circumference is less than 35" for women and less than 40" for men.   Eat Right -focus on fruits, vegetables, whole grains, and meals you make yourself. Avoid foods with trans fat and high sugar/sodium content.   Snooze or Snore? - Loud snoring can be a sign of sleep apnea, a significant risk factor for high blood pressure, heart attach, stroke, and heart arrhythmias.  Kick the habit -Quit Smoking! Avoid second hand smoke. A single cigarette raises your blood pressure for 20 mins and increases the risk of heart attack and stroke for the next 24 hours.   Are Aspirin and Supplements right for you? -Add ENTERIC COATED low dose 81 mg Aspirin daily OR can do every other day if you have easy bruising to protect your  heart and head. As well as to reduce risk of Colon Cancer by 20 %, Skin Cancer by 26 % , Melanoma by 46% and Pancreatic cancer by 60%  Say "No to Stress -There may be little you can do about problems that cause stress. However, techniques such as long walks, meditation, and exercise can help you manage it.   Start Now! - Make changes one at a time and set reasonable goals to increase your likelihood of success.

## 2019-02-08 LAB — COMPLETE METABOLIC PANEL WITH GFR
AG Ratio: 1.5 (calc) (ref 1.0–2.5)
ALT: 75 U/L — ABNORMAL HIGH (ref 9–46)
AST: 51 U/L — ABNORMAL HIGH (ref 10–35)
Albumin: 4.5 g/dL (ref 3.6–5.1)
Alkaline phosphatase (APISO): 51 U/L (ref 35–144)
BUN: 18 mg/dL (ref 7–25)
CO2: 23 mmol/L (ref 20–32)
Calcium: 9.7 mg/dL (ref 8.6–10.3)
Chloride: 102 mmol/L (ref 98–110)
Creat: 0.97 mg/dL (ref 0.70–1.25)
GFR, Est African American: 93 mL/min/{1.73_m2} (ref >=60)
GFR, Est Non African American: 80 mL/min/{1.73_m2} (ref >=60)
Globulin: 3 g/dL (calc) (ref 1.9–3.7)
Glucose, Bld: 104 mg/dL — ABNORMAL HIGH (ref 65–99)
Potassium: 4.1 mmol/L (ref 3.5–5.3)
Sodium: 138 mmol/L (ref 135–146)
Total Bilirubin: 0.5 mg/dL (ref 0.2–1.2)
Total Protein: 7.5 g/dL (ref 6.1–8.1)

## 2019-02-08 LAB — CBC WITH DIFFERENTIAL/PLATELET
Absolute Monocytes: 670 cells/uL (ref 200–950)
Basophils Absolute: 92 cells/uL (ref 0–200)
Basophils Relative: 1.2 %
Eosinophils Absolute: 69 cells/uL (ref 15–500)
Eosinophils Relative: 0.9 %
HCT: 41.8 % (ref 38.5–50.0)
Hemoglobin: 14.1 g/dL (ref 13.2–17.1)
Lymphs Abs: 1771 cells/uL (ref 850–3900)
MCH: 31.7 pg (ref 27.0–33.0)
MCHC: 33.7 g/dL (ref 32.0–36.0)
MCV: 93.9 fL (ref 80.0–100.0)
MPV: 10.6 fL (ref 7.5–12.5)
Monocytes Relative: 8.7 %
Neutro Abs: 5097 cells/uL (ref 1500–7800)
Neutrophils Relative %: 66.2 %
Platelets: 275 10*3/uL (ref 140–400)
RBC: 4.45 10*6/uL (ref 4.20–5.80)
RDW: 12.2 % (ref 11.0–15.0)
Total Lymphocyte: 23 %
WBC: 7.7 10*3/uL (ref 3.8–10.8)

## 2019-02-08 LAB — HEMOGLOBIN A1C
Hgb A1c MFr Bld: 6.1 % of total Hgb — ABNORMAL HIGH (ref <5.7)
Mean Plasma Glucose: 128 (calc)
eAG (mmol/L): 7.1 (calc)

## 2019-02-08 LAB — MAGNESIUM: Magnesium: 1.9 mg/dL (ref 1.5–2.5)

## 2019-02-08 LAB — LIPID PANEL
Cholesterol: 129 mg/dL (ref <200)
HDL: 31 mg/dL — ABNORMAL LOW (ref >=40)
LDL Cholesterol (Calc): 70 mg/dL (calc)
Non-HDL Cholesterol (Calc): 98 mg/dL (calc) (ref <130)
Total CHOL/HDL Ratio: 4.2 (calc) (ref <5.0)
Triglycerides: 225 mg/dL — ABNORMAL HIGH (ref <150)

## 2019-02-08 LAB — URIC ACID: Uric Acid, Serum: 6.6 mg/dL (ref 4.0–8.0)

## 2019-02-08 LAB — TSH: TSH: 1.57 mIU/L (ref 0.40–4.50)

## 2019-02-21 ENCOUNTER — Encounter: Payer: Self-pay | Admitting: Internal Medicine

## 2019-02-21 NOTE — Progress Notes (Signed)
SPOKE WITH exact sciences  Mr. Reckner test was neg back in NOV.  Results have been requested to be FAXED again.  May 19th 2020  COLOGUARD order was under Ashley's name so it maybe placed in her box but I will look out for the results.

## 2019-02-22 ENCOUNTER — Encounter: Payer: Self-pay | Admitting: Adult Health

## 2019-02-22 ENCOUNTER — Ambulatory Visit (INDEPENDENT_AMBULATORY_CARE_PROVIDER_SITE_OTHER): Payer: Medicare HMO | Admitting: Adult Health

## 2019-02-22 ENCOUNTER — Other Ambulatory Visit: Payer: Self-pay

## 2019-02-22 VITALS — BP 120/80 | HR 66 | Temp 97.5°F | Ht 74.0 in | Wt 282.0 lb

## 2019-02-22 DIAGNOSIS — M7022 Olecranon bursitis, left elbow: Secondary | ICD-10-CM | POA: Diagnosis not present

## 2019-02-22 DIAGNOSIS — M25522 Pain in left elbow: Secondary | ICD-10-CM | POA: Diagnosis not present

## 2019-02-22 DIAGNOSIS — M25422 Effusion, left elbow: Secondary | ICD-10-CM | POA: Diagnosis not present

## 2019-02-22 LAB — CBC WITH DIFFERENTIAL/PLATELET
Absolute Monocytes: 744 cells/uL (ref 200–950)
Basophils Absolute: 87 cells/uL (ref 0–200)
Basophils Relative: 1.3 %
Eosinophils Absolute: 60 cells/uL (ref 15–500)
Eosinophils Relative: 0.9 %
HCT: 41.5 % (ref 38.5–50.0)
Hemoglobin: 14.4 g/dL (ref 13.2–17.1)
Lymphs Abs: 1655 cells/uL (ref 850–3900)
MCH: 32.4 pg (ref 27.0–33.0)
MCHC: 34.7 g/dL (ref 32.0–36.0)
MCV: 93.3 fL (ref 80.0–100.0)
MPV: 10.6 fL (ref 7.5–12.5)
Monocytes Relative: 11.1 %
Neutro Abs: 4154 cells/uL (ref 1500–7800)
Neutrophils Relative %: 62 %
Platelets: 286 10*3/uL (ref 140–400)
RBC: 4.45 10*6/uL (ref 4.20–5.80)
RDW: 12.1 % (ref 11.0–15.0)
Total Lymphocyte: 24.7 %
WBC: 6.7 10*3/uL (ref 3.8–10.8)

## 2019-02-22 MED ORDER — DEXAMETHASONE SODIUM PHOSPHATE 10 MG/ML IJ SOLN
10.0000 mg | Freq: Once | INTRAMUSCULAR | Status: AC
  Administered 2019-02-22: 11:00:00 10 mg

## 2019-02-22 MED ORDER — MELOXICAM 15 MG PO TABS: ORAL_TABLET | ORAL | 1 refills | Status: DC

## 2019-02-22 MED ORDER — GLUCOSE BLOOD VI STRP: ORAL_STRIP | 12 refills | Status: DC

## 2019-02-22 MED ORDER — DEXAMETHASONE SODIUM PHOSPHATE 10 MG/ML IJ SOLN: 10.0000 mg | Freq: Once | INTRAMUSCULAR | Status: DC

## 2019-02-22 NOTE — Patient Instructions (Addendum)
Elbow Bursitis  Bursitis is swelling and pain at the tip of the elbow. This happens when fluid builds up in a sac under the skin (bursa). This may also be called olecranon bursitis. What are the causes? Elbow bursitis may be caused by:  Elbow injury, such as falling onto the elbow.  Leaning on hard surfaces for long periods of time.  Infection from an injury that breaks the skin near the elbow.  A bone growth (spur) that forms at the tip of the elbow.  A medical condition that causes inflammation, such as gout or rheumatoid arthritis. Sometimes the cause is not known. What are the signs or symptoms? The first sign of elbow bursitis is usually swelling at the tip of the elbow. This can grow to be about the size of a golf ball. Swelling may start suddenly or develop gradually. Other symptoms may include:  Pain when bending or leaning on the elbow.  Not being able to move the elbow normally. If bursitis is caused by an infection, you may have:  Redness, warmth, and tenderness of the elbow.  Drainage of pus from the swollen area over the elbow, if the skin breaks open. How is this diagnosed? This condition may be diagnosed based on:  Your symptoms and medical history.  Any recent injuries you have had.  A physical exam.  X-rays to check for a bone spur or fracture.  Draining fluid from the bursa to test it for infection.  Blood tests to rule out gout or rheumatoid arthritis. How is this treated? Treatment for elbow bursitis depends on the cause. Treatment may include:  Medicines. These may include: ? Over-the-counter medicines to relieve pain and inflammation. ? Antibiotic medicines. ? Injections of anti-inflammatory medicines (steroids).  Draining fluid from the bursa.  Wrapping your elbow with a bandage.  Wearing elbow pads. If these treatments do not help, you may need surgery to remove the bursa. Follow these instructions at home: Medicines  Take  over-the-counter and prescription medicines only as told by your health care provider.  If you were prescribed an antibiotic medicine, take it as told by your health care provider. Do not stop taking the antibiotic even if you start to feel better. Managing pain, stiffness, and swelling   If directed, put ice on your elbow: ? Put ice in a plastic bag. ? Place a towel between your skin and the bag. ? Leave the ice on for 20 minutes, 2-3 times a day.  If your bursitis is caused by an injury, rest your elbow and wear your bandage as told by your health care provider.  Use elbow pads or elbow wraps to cushion your elbow as needed. General instructions  Avoid any activities that cause elbow pain. Ask your health care provider what activities are safe for you.  Keep all follow-up visits as told by your health care provider. This is important. Contact a health care provider if you have:  A fever.  Symptoms that do not get better with treatment.  Pain or swelling that: ? Gets worse. ? Goes away and then comes back.  Pus draining from your elbow. Get help right away if you have:  Trouble moving your arm, hand, or fingers. Summary  Elbow bursitis is inflammation of the fluid-filled sac (bursa) between the tip of your elbow bone (olecranon) and your skin.  Treatment for elbow bursitis depends on the cause. It may include medicines to relieve pain and inflammation, antibiotic medicines, and draining fluid from your elbow.    Contact a health care provider if your symptoms do not get better with treatment, or if your symptoms go away and then come back. This information is not intended to replace advice given to you by your health care provider. Make sure you discuss any questions you have with your health care provider. Document Released: 10/21/2006 Document Revised: 08/31/2017 Document Reviewed: 08/31/2017 Elsevier Interactive Patient Education  2019 [employer]    Meloxicam  tablets What is this medicine? MELOXICAM (mel OX i cam) is a non-steroidal anti-inflammatory drug (NSAID). It is used to reduce swelling and to treat pain. It may be used for osteoarthritis, rheumatoid arthritis, or juvenile rheumatoid arthritis. This medicine may be used for other purposes; ask your health care provider or pharmacist if you have questions. COMMON BRAND NAME(S): Mobic What should I tell my health care provider before I take this medicine? They need to know if you have any of these conditions: -bleeding disorders -cigarette smoker -coronary artery bypass graft (CABG) surgery within the past 2 weeks -drink more than 3 alcohol-containing drinks per day -heart disease -high blood pressure -history of stomach bleeding -kidney disease -liver disease -lung or breathing disease, like asthma -stomach or intestine problems -an unusual or allergic reaction to meloxicam, aspirin, other NSAIDs, other medicines, foods, dyes, or preservatives -pregnant or trying to get pregnant -breast-feeding How should I use this medicine? Take this medicine by mouth with a full glass of water. Follow the directions on the prescription label. You can take it with or without food. If it upsets your stomach, take it with food. Take your medicine at regular intervals. Do not take it more often than directed. Do not stop taking except on your doctor's advice. A special MedGuide will be given to you by the pharmacist with each prescription and refill. Be sure to read this information carefully each time. Talk to your pediatrician regarding the use of this medicine in children. While this drug may be prescribed for selected conditions, precautions do apply. Patients over 50 years old may have a stronger reaction and need a smaller dose. Overdosage: If you think you have taken too much of this medicine contact a poison control center or emergency room at once. NOTE: This medicine is only for you. Do not share  this medicine with others. What if I miss a dose? If you miss a dose, take it as soon as you can. If it is almost time for your next dose, take only that dose. Do not take double or extra doses. What may interact with this medicine? Do not take this medicine with any of the following medications: -cidofovir -ketorolac This medicine may also interact with the following medications: -aspirin and aspirin-like medicines -certain medicines for blood pressure, heart disease, irregular heart beat -certain medicines for depression, anxiety, or psychotic disturbances -certain medicines that treat or prevent blood clots like warfarin, enoxaparin, dalteparin, apixaban, dabigatran, rivaroxaban -cyclosporine -diuretics -methotrexate -other NSAIDs, medicines for pain and inflammation, like ibuprofen and naproxen -pemetrexed This list may not describe all possible interactions. Give your health care provider a list of all the medicines, herbs, non-prescription drugs, or dietary supplements you use. Also tell them if you smoke, drink alcohol, or use illegal drugs. Some items may interact with your medicine. What should I watch for while using this medicine? Tell your doctor or healthcare professional if your symptoms do not start to get better or if they get worse. Do not take other medicines that contain aspirin, ibuprofen, or naproxen with this  medicine. Side effects such as stomach upset, nausea, or ulcers may be more likely to occur. Many medicines available without a prescription should not be taken with this medicine. This medicine can cause ulcers and bleeding in the stomach and intestines at any time during treatment. This can happen with no warning and may cause death. There is increased risk with taking this medicine for a long time. Smoking, drinking alcohol, older age, and poor health can also increase risks. Call your doctor right away if you have stomach pain or blood in your vomit or stool. This  medicine does not prevent heart attack or stroke. In fact, this medicine may increase the chance of a heart attack or stroke. The chance may increase with longer use of this medicine and in people who have heart disease. If you take aspirin to prevent heart attack or stroke, talk with your doctor or health care professional. What side effects may I notice from receiving this medicine? Side effects that you should report to your doctor or health care professional as soon as possible: -allergic reactions like skin rash, itching or hives, swelling of the face, lips, or tongue -nausea, vomiting -signs and symptoms of a blood clot such as breathing problems; changes in vision; chest pain; severe, sudden headache; pain, swelling, warmth in the leg; trouble speaking; sudden numbness or weakness of the face, arm, or leg -signs and symptoms of bleeding such as bloody or black, tarry stools; red or dark-brown urine; spitting up blood or brown material that looks like coffee grounds; red spots on the skin; unusual bruising or bleeding from the eye, gums, or nose -signs and symptoms of liver injury like dark yellow or brown urine; general ill feeling or flu-like symptoms; light-colored stools; loss of appetite; nausea; right upper belly pain; unusually weak or tired; yellowing of the eyes or skin -signs and symptoms of stroke like changes in vision; confusion; trouble speaking or understanding; severe headaches; sudden numbness or weakness of the face, arm, or leg; trouble walking; dizziness; loss of balance or coordination Side effects that usually do not require medical attention (report to your doctor or health care professional if they continue or are bothersome): -constipation -diarrhea -gas This list may not describe all possible side effects. Call your doctor for medical advice about side effects. You may report side effects to FDA at 1-800-FDA-1088. Where should I keep my medicine? Keep out of the reach  of children. Store at room temperature between 15 and 30 degrees C (59 and 86 degrees F). Throw away any unused medicine after the expiration date. NOTE: This sheet is a summary. It may not cover all possible information. If you have questions about this medicine, talk to your doctor, pharmacist, or health care provider.  2019 Elsevier/Gold Standard (2018-01-21 11:22:56)

## 2019-02-22 NOTE — Progress Notes (Signed)
Assessment and Plan:  Roger Mcclain was seen today for elbow pain.  Diagnoses and all orders for this visit:  Pain and swelling of left elbow Suggestive of simple olecranon bursitis, will send fluid for analysis -     CBC with Differential/Platelet -     meloxicam (MOBIC) 15 MG tablet; Take one daily with food for 2 weeks, can take with tylenol, can not take with aleve, iburpofen, then as needed daily for pain -     Cell count + diff,  w/ cryst-synvl fld  Olecranon bursitis of left elbow Meloxicam (MOBIC) 15 MG tablet; Take one daily with food for 2 weeks, can take with tylenol, can not take with aleve, iburpofen, then as needed daily for pain  Avoid putting pressure on elbow- discussed padding knee/avoiding trigger for several weeks to allow inflammation to subside If continues to have pain, may consider xray or ultrasound vs ortho referral Call office should the elbow become injected or inflamed After prep with alcohol and betadine, Fluid drawn off, serous, not suggestive of infection though will send off for analysis and cell count Steroid with bupivacaine injected without complication, tolerated well  Given instructions to monitor for complications, call with any fever/chills, worsening local pain, changes in joint ROM, notable heat/erythema to area of injection -     dexamethasone (DECADRON) injection 10 mg  Other orders -     glucose blood test strip; Use once daily with Accu-Chek meter to check BS. Dx. E11.9    Further disposition pending results of labs. Discussed med's effects and SE's.   Over 20 minutes of exam, counseling, chart review, and critical decision making was performed.   Future Appointments  Date Time Provider [place]  05/16/2019  9:00 AM Roger Pinto, MD [facility] None  02/19/2020  9:00 AM Roger Mutters, PA-C [facility] None     ------------------------------------------------------------------------------------------------------------------   HPI BP 120/80   Pulse 66   Temp (!) 97.5 F (36.4 C)   Ht '6\' 2"'$  (1.88 m)   Wt 282 lb (127.9 kg)   SpO2 97%   BMI 36.21 kg/m   68 y.o.male L handed with hx of T2DM without compication, gout, morbid obesity presents for evaluation of swollen and tender left elbow.   Gradually increasing over the past 2 weeks, hit elbow after area began to swelling and was very tender. Edema localized over olecranon process. Mildly warm, no redness over area. Uncomfortable but denies significant pain, rare aching sensation. He has maintained full ROM.   He reports has been feeling somewhat unwell, nauseous for the past 2 weeks.  Denies fever/chills, numbness, tingling, emesis, headache, myalgias, dyspnea, palpitations, rash.   Hasn't taken any OTC medication   Earlier this year in 12/2018 he had Dr. Melford Mcclain excise scar tissue that was frequently tender. No complications at follow up on 3/12 and he noted no concerns after that until recent complaint.    Past Medical History:  Diagnosis Date  . Diabetes mellitus without complication ([place]) 4580  . Hyperlipidemia 2017  . Hypertension 2010     No Known Allergies  Current Outpatient Medications on File Prior to Visit  Medication Sig  . ACCU-CHEK SOFTCLIX LANCETS lancets Use once daily with Accu-Chek meter to check BS reading. Dx. E11.9  . allopurinol (ZYLOPRIM) 300 MG tablet Take 1 tablet daily to prevent Gout Attack  . Blood Glucose Monitoring Suppl (ACCU-CHEK AVIVA PLUS) w/Device KIT Use daily to check BS.  Dx. E11.9  . Cholecalciferol (VITAMIN D PO) Take 1,000 Units  by mouth daily.  . diphenhydramine-acetaminophen (TYLENOL PM) 25-500 MG TABS tablet Take 1 tablet by mouth at bedtime as needed.  . fenofibrate micronized (LOFIBRA) 134 MG capsule Take 1 capsule (134 mg total) by mouth daily before breakfast.  . gabapentin  (NEURONTIN) 100 MG capsule Take 1-2 capsules at bedtime.  Marland Kitchen losartan-hydrochlorothiazide (HYZAAR) 100-25 MG tablet Take 1/2 to 1   tablet daily for BP  . metFORMIN (GLUCOPHAGE-XR) 500 MG 24 hr tablet Take 1 tablet (500 mg total) by mouth 2 (two) times daily before a meal.  . Multiple Vitamin (MULTIVITAMIN) tablet Take 1 tablet by mouth daily.  Marland Kitchen OVER THE COUNTER MEDICATION OTC Flonase 2 sprays per nostril every evening  . rosuvastatin (CRESTOR) 40 MG tablet Take 1/2 to 1 tablet daily or as directed for Cholesterol (Patient taking differently: Take one tablet every other day)  . ibuprofen (ADVIL,MOTRIN) 200 MG tablet Take 800 mg by mouth at bedtime as needed.   No current facility-administered medications on file prior to visit.     ROS: all negative except above.   Physical Exam:  BP 120/80   Pulse 66   Temp (!) 97.5 F (36.4 C)   Ht '6\' 2"'$  (1.88 m)   Wt 282 lb (127.9 kg)   SpO2 97%   BMI 36.21 kg/m   General Appearance: Well nourished, in no apparent distress. Eyes: conjunctiva no swelling or erythema ENT/Mouth:Hearing normal.  Neck: Supple Respiratory: Respiratory effort normal, BS equal bilaterally without rales, rhonchi, wheezing or stridor.  Cardio: RRR with no MRGs. Brisk peripheral pulses without edema.  Abdomen: Soft, + BS.  Non tender, no guarding, rebound, hernias, masses. Lymphatics: Non tender without lymphadenopathy.  Musculoskeletal: Full ROM, 5/5 strength, normal gait. L elbow without effusion to joint space, full ROM intact, no laxity, crepitus. He has swelling over olecranon process, localized, very faintly warm to touch, non-erythematous.  Skin: Warm, dry without rashes, lesions, ecchymosis.  Neuro: Sensation intact.  Psych: Awake and oriented X 3, normal affect, Insight and Judgment appropriate.     Roger Ribas, NP 11:25 AM [name]

## 2019-02-23 LAB — SYNOVIAL CELL COUNT + DIFF, W/ CRYSTALS
Basophils, %: 0 %
Eosinophils-Synovial: 0 % (ref 0–2)
Lymphocytes-Synovial Fld: 17 % (ref 0–74)
Monocyte/Macrophage: 11 % (ref 0–69)
Neutrophil, Synovial: 71 % — ABNORMAL HIGH (ref 0–24)
Synoviocytes, %: 1 % (ref 0–15)
WBC, Synovial: 1825 cells/uL — ABNORMAL HIGH (ref <150)

## 2019-04-19 ENCOUNTER — Other Ambulatory Visit: Payer: Self-pay

## 2019-04-19 DIAGNOSIS — I10 Essential (primary) hypertension: Secondary | ICD-10-CM

## 2019-04-19 DIAGNOSIS — Z79899 Other long term (current) drug therapy: Secondary | ICD-10-CM

## 2019-04-19 DIAGNOSIS — E782 Mixed hyperlipidemia: Secondary | ICD-10-CM

## 2019-04-19 MED ORDER — GABAPENTIN 100 MG PO CAPS: ORAL_CAPSULE | ORAL | 1 refills | Status: DC

## 2019-04-19 MED ORDER — LOSARTAN POTASSIUM-HCTZ 100-25 MG PO TABS: ORAL_TABLET | ORAL | 3 refills | Status: DC

## 2019-04-19 MED ORDER — FENOFIBRATE 134 MG PO CAPS: 134.0000 mg | ORAL_CAPSULE | Freq: Every day | ORAL | 3 refills | Status: DC

## 2019-04-19 NOTE — Telephone Encounter (Signed)
Gabapentin, Fenofibrate and Losartan refill requests.

## 2019-04-22 ENCOUNTER — Other Ambulatory Visit: Payer: Self-pay | Admitting: Internal Medicine

## 2019-04-22 DIAGNOSIS — E782 Mixed hyperlipidemia: Secondary | ICD-10-CM

## 2019-05-15 ENCOUNTER — Encounter: Payer: Self-pay | Admitting: Internal Medicine

## 2019-05-15 DIAGNOSIS — Z1211 Encounter for screening for malignant neoplasm of colon: Secondary | ICD-10-CM | POA: Insufficient documentation

## 2019-05-15 DIAGNOSIS — E782 Mixed hyperlipidemia: Secondary | ICD-10-CM | POA: Insufficient documentation

## 2019-05-15 NOTE — Progress Notes (Signed)
Annual  Screening/Preventative Visit  & Comprehensive Evaluation & Examination     This very nice 68 y.o. MWM presents for a Screening /Preventative Visit & comprehensive evaluation and management of multiple medical co-morbidities.  Patient has been followed for HTN, HLD, T2_NIDDM  and Vitamin D Deficiency. Patient has hx/o Gout and only takes his Allopurinol.      HTN predates since 2010. Patient's BP has been controlled at home.  Today's BP is at goal - 138/84. Patient denies any cardiac symptoms as chest pain, palpitations, shortness of breath, dizziness or ankle swelling.     Patient's hyperlipidemia is controlled with diet and medications. Patient denies myalgias or other medication SE's. Last lipids were at goal except elevated Trig's: Lab Results  Component Value Date   CHOL 129 02/07/2019   HDL 31 (L) 02/07/2019   LDLCALC 70 02/07/2019   TRIG 225 (H) 02/07/2019   CHOLHDL 4.2 02/07/2019      Patient has hx/o T2_NIDDM w/CKD2 ([id number]) and patient denies reactive hypoglycemic symptoms, visual blurring, diabetic polys or paresthesias. Last A1c was not at goal: Lab Results  Component Value Date   HGBA1C 6.1 (H) 02/07/2019       Finally, patient has history of Vitamin D Deficiency ("23" / Mar 2018)  and last vitamin D was still very low (goal 70-100): Lab Results  Component Value Date   VD25OH 39 10/25/2018   Current Outpatient Medications on File Prior to Visit  Medication Sig  . ACCU-CHEK SOFTCLIX LANCETS lancets Use once daily with Accu-Chek meter to check BS reading. Dx. E11.9  . allopurinol (ZYLOPRIM) 300 MG tablet Take 1 tablet daily to prevent Gout Attack  . Blood Glucose Monitoring Suppl (ACCU-CHEK AVIVA PLUS) w/Device KIT Use daily to check BS.  Dx. E11.9  . Cholecalciferol (VITAMIN D PO) Take 1,000 Units by mouth daily.  . diphenhydramine-acetaminophen (TYLENOL PM) 25-500 MG TABS tablet Take 1 tablet by mouth at bedtime as needed.  . fenofibrate micronized (LOFIBRA)  134 MG capsule Take 1 capsule (134 mg total) by mouth daily before breakfast.  . gabapentin (NEURONTIN) 100 MG capsule Take 1-2 capsules at bedtime.  Marland Kitchen glucose blood test strip Use once daily with Accu-Chek meter to check BS. Dx. E11.9  . losartan-hydrochlorothiazide (HYZAAR) 100-25 MG tablet Take 1/2 to 1   tablet daily for BP  . meloxicam (MOBIC) 15 MG tablet Take one daily with food for 2 weeks, can take with tylenol, can not take with aleve, iburpofen, then as needed daily for pain  . metFORMIN (GLUCOPHAGE-XR) 500 MG 24 hr tablet Take 1 tablet (500 mg total) by mouth 2 (two) times daily before a meal.  . Multiple Vitamin (MULTIVITAMIN) tablet Take 1 tablet by mouth daily.  Marland Kitchen OVER THE COUNTER MEDICATION OTC Flonase 2 sprays per nostril every evening  . rosuvastatin (CRESTOR) 40 MG tablet Take 1 tablet Daily for Cholesterol   No current facility-administered medications on file prior to visit.    No Known Allergies   Past Medical History:  Diagnosis Date  . Diabetes mellitus without complication ([place]) 8003  . Hyperlipidemia 2017  . Hypertension 2010   Health Maintenance  Topic Date Due  . Hepatitis C Screening  09-Oct-1950  . OPHTHALMOLOGY EXAM  12/06/1961  . PNA vac Low Risk Adult (2 of 2 - PPSV23) 04/19/2019  . INFLUENZA VACCINE  05/06/2019  . HEMOGLOBIN A1C  08/10/2019  . FOOT EXAM  05/14/2020  . Fecal DNA (Cologuard)  08/16/2021  . TETANUS/TDAP  10/13/2025  Immunization History  Administered Date(s) Administered  . Influenza, High Dose Seasonal PF 06/24/2017, 07/21/2018  . Pneumococcal Conjugate-13 04/18/2018  . Tdap 10/14/2015   - Cologard - 08/23/2018 - Negative - 3 year f/u due Nov 2022  History reviewed. No pertinent surgical history.   Family History  Problem Relation Age of Onset  . Hypertension Mother   . Heart disease Mother   . Diabetes Father   . Hyperlipidemia Father   . Hypertension Father    Social History   Socioeconomic History  . Marital  status: Married    Spouse name: Not on file  . Number of children: Not on file  . Years of education: Not on file  . Highest education level: Not on file  Occupational History  . Not on file  Tobacco Use  . Smoking status: Never Smoker  . Smokeless tobacco: Never Used  Substance and Sexual Activity  . Alcohol use: Yes  . Drug use: No  . Sexual activity: Yes    ROS Constitutional: Denies fever, chills, weight loss/gain, headaches, insomnia,  night sweats or change in appetite. Does c/o fatigue. Eyes: Denies redness, blurred vision, diplopia, discharge, itchy or watery eyes.  ENT: Denies discharge, congestion, post nasal drip, epistaxis, sore throat, earache, hearing loss, dental pain, Tinnitus, Vertigo, Sinus pain or snoring.  Cardio: Denies chest pain, palpitations, irregular heartbeat, syncope, dyspnea, diaphoresis, orthopnea, PND, claudication or edema Respiratory: denies cough, dyspnea, DOE, pleurisy, hoarseness, laryngitis or wheezing.  Gastrointestinal: Denies dysphagia, heartburn, reflux, water brash, pain, cramps, nausea, vomiting, bloating, diarrhea, constipation, hematemesis, melena, hematochezia, jaundice or hemorrhoids Genitourinary: Denies dysuria, frequency,  discharge, hematuria or flank pain. Has urgency, nocturia x 2-3 & occasional hesitancy. Musculoskeletal: Denies arthralgia, myalgia, stiffness, Jt. Swelling, pain, limp or strain/sprain. Denies Falls. Skin: Denies puritis, rash, hives, warts, acne, eczema or change in skin lesion Neuro: No weakness, tremor, incoordination, spasms, paresthesia or pain Psychiatric: Denies confusion, memory loss or sensory loss. Denies Depression. Endocrine: Denies change in weight, skin, hair change, nocturia, and paresthesia, diabetic polys, visual blurring or hyper / hypo glycemic episodes.  Heme/Lymph: No excessive bleeding, bruising or enlarged lymph nodes.  Physical Exam  BP 138/84   Pulse 72   Temp (!) 97.1 F (36.2 C)   Resp  16   Ht '6\' 2"'$  (1.88 m)   Wt 285 lb 6.4 oz (129.5 kg)   BMI 36.64 kg/m   General Appearance: Well nourished and well groomed and in no apparent distress.  Eyes: PERRLA, EOMs, conjunctiva no swelling or erythema, normal fundi and vessels. Sinuses: No frontal/maxillary tenderness ENT/Mouth: EACs patent / TMs  nl. Nares clear without erythema, swelling, mucoid exudates. Oral hygiene is good. No erythema, swelling, or exudate. Tongue normal, non-obstructing. Tonsils not swollen or erythematous. Hearing normal.  Neck: Supple, thyroid not palpable. No bruits, nodes or JVD. Respiratory: Respiratory effort normal.  BS equal and clear bilateral without rales, rhonci, wheezing or stridor. Cardio: Heart sounds are normal with regular rate and rhythm and no murmurs, rubs or gallops. Peripheral pulses are normal and equal bilaterally without edema. No aortic or femoral bruits. Chest: symmetric with normal excursions and percussion.  Abdomen: Soft, with Nl bowel sounds. Nontender, no guarding, rebound, hernias, masses, or organomegaly.  Lymphatics: Non tender without lymphadenopathy.  Musculoskeletal: Full ROM all peripheral extremities, joint stability, 5/5 strength, and normal gait. Skin: Warm and dry without rashes, lesions, cyanosis, clubbing or  ecchymosis.  Neuro: Cranial nerves intact, reflexes equal bilaterally. Normal muscle tone, no cerebellar symptoms.  Sensation intact to touch, vibratory and Monofilament to the toes bilaterally  Pysch: Alert and oriented X 3 with normal affect, insight and judgment appropriate.   Assessment and Plan  1. Annual Preventative/Screening Exam   2. Essential hypertension  - EKG 12-Lead - [place], RETROPERITNL ABD,  LTD - Urinalysis, Routine w reflex microscopic - Microalbumin / creatinine urine ratio - CBC with Differential/Platelet - COMPLETE METABOLIC PANEL WITH GFR - Magnesium - TSH  3. Hyperlipidemia, mixed  - EKG 12-Lead - [place], RETROPERITNL ABD,  LTD -  Lipid panel - TSH  4. Type 2 diabetes mellitus with stage 2 chronic kidney disease, without long-term current use of insulin (HCC)  - EKG 12-Lead - [place], RETROPERITNL ABD,  LTD - Urinalysis, Routine w reflex microscopic - Microalbumin / creatinine urine ratio - HM DIABETES FOOT EXAM - LOW EXTREMITY NEUR EXAM DOCUM - Hemoglobin A1c - Insulin, random  5. Vitamin D deficiency  - VITAMIN D 25 Hydroxyl  6. Morbid obesity ([place])   7. Screening for colorectal cancer  - POC Hemoccult Bld/Stl - Uric acid  8. Gout    9. BPH with obstruction/lower urinary tract symptoms  - PSA  10. Screening for prostate cancer  - PSA  11. Screening for ischemic heart disease  - EKG 12-Lead  12. FHx: heart disease  - EKG 12-Lead - [place], RETROPERITNL ABD,  LTD  13. Screening for AAA (aortic abdominal aneurysm)  - [place], RETROPERITNL ABD,  LTD  14. Medication management  - Urinalysis, Routine w reflex microscopic - Microalbumin / creatinine urine ratio - Uric acid - CBC with Differential/Platelet - COMPLETE METABOLIC PANEL WITH GFR - Magnesium - Lipid panel - TSH - Hemoglobin A1c - Insulin, random - VITAMIN D 25 Hydroxyl        Patient was counseled in prudent diet, weight control to achieve/maintain BMI less than 25, BP monitoring, regular exercise and medications as discussed.  Discussed med effects and SE's. Routine screening labs and tests as requested with regular follow-up as recommended. Over 40 minutes of exam, counseling, chart review and high complex critical decision making was performed   Kirtland Bouchard, MD

## 2019-05-15 NOTE — Patient Instructions (Signed)

## 2019-05-16 ENCOUNTER — Other Ambulatory Visit: Payer: Self-pay | Admitting: Internal Medicine

## 2019-05-16 ENCOUNTER — Ambulatory Visit (INDEPENDENT_AMBULATORY_CARE_PROVIDER_SITE_OTHER): Payer: Medicare HMO | Admitting: Internal Medicine

## 2019-05-16 ENCOUNTER — Other Ambulatory Visit: Payer: Self-pay

## 2019-05-16 VITALS — BP 138/84 | HR 72 | Temp 97.1°F | Resp 16 | Ht 74.0 in | Wt 285.4 lb

## 2019-05-16 DIAGNOSIS — Z125 Encounter for screening for malignant neoplasm of prostate: Secondary | ICD-10-CM | POA: Diagnosis not present

## 2019-05-16 DIAGNOSIS — N401 Enlarged prostate with lower urinary tract symptoms: Secondary | ICD-10-CM | POA: Diagnosis not present

## 2019-05-16 DIAGNOSIS — I10 Essential (primary) hypertension: Secondary | ICD-10-CM

## 2019-05-16 DIAGNOSIS — M109 Gout, unspecified: Secondary | ICD-10-CM

## 2019-05-16 DIAGNOSIS — Z8249 Family history of ischemic heart disease and other diseases of the circulatory system: Secondary | ICD-10-CM

## 2019-05-16 DIAGNOSIS — E782 Mixed hyperlipidemia: Secondary | ICD-10-CM | POA: Diagnosis not present

## 2019-05-16 DIAGNOSIS — N138 Other obstructive and reflux uropathy: Secondary | ICD-10-CM | POA: Diagnosis not present

## 2019-05-16 DIAGNOSIS — Z23 Encounter for immunization: Secondary | ICD-10-CM

## 2019-05-16 DIAGNOSIS — Z1211 Encounter for screening for malignant neoplasm of colon: Secondary | ICD-10-CM

## 2019-05-16 DIAGNOSIS — Z0000 Encounter for general adult medical examination without abnormal findings: Secondary | ICD-10-CM

## 2019-05-16 DIAGNOSIS — Z136 Encounter for screening for cardiovascular disorders: Secondary | ICD-10-CM

## 2019-05-16 DIAGNOSIS — E559 Vitamin D deficiency, unspecified: Secondary | ICD-10-CM | POA: Diagnosis not present

## 2019-05-16 DIAGNOSIS — Z0001 Encounter for general adult medical examination with abnormal findings: Secondary | ICD-10-CM

## 2019-05-16 DIAGNOSIS — Z79899 Other long term (current) drug therapy: Secondary | ICD-10-CM | POA: Diagnosis not present

## 2019-05-16 DIAGNOSIS — E1122 Type 2 diabetes mellitus with diabetic chronic kidney disease: Secondary | ICD-10-CM

## 2019-05-16 DIAGNOSIS — N182 Chronic kidney disease, stage 2 (mild): Secondary | ICD-10-CM | POA: Diagnosis not present

## 2019-05-16 MED ORDER — TOPIRAMATE 50 MG PO TABS: ORAL_TABLET | ORAL | 1 refills | Status: DC

## 2019-05-16 MED ORDER — PHENTERMINE HCL 37.5 MG PO TABS: ORAL_TABLET | ORAL | 1 refills | Status: DC

## 2019-05-16 MED ORDER — TADALAFIL 20 MG PO TABS: ORAL_TABLET | ORAL | 99 refills | Status: DC

## 2019-05-17 ENCOUNTER — Other Ambulatory Visit: Payer: Self-pay | Admitting: Internal Medicine

## 2019-05-17 LAB — MICROALBUMIN / CREATININE URINE RATIO
Creatinine, Urine: 91 mg/dL (ref 20–320)
Microalb Creat Ratio: 2 mcg/mg creat (ref <30)
Microalb, Ur: 0.2 mg/dL

## 2019-05-17 LAB — COMPLETE METABOLIC PANEL WITH GFR
AG Ratio: 2 (calc) (ref 1.0–2.5)
ALT: 107 U/L — ABNORMAL HIGH (ref 9–46)
AST: 79 U/L — ABNORMAL HIGH (ref 10–35)
Albumin: 4.9 g/dL (ref 3.6–5.1)
Alkaline phosphatase (APISO): 54 U/L (ref 35–144)
BUN: 14 mg/dL (ref 7–25)
CO2: 27 mmol/L (ref 20–32)
Calcium: 10.3 mg/dL (ref 8.6–10.3)
Chloride: 103 mmol/L (ref 98–110)
Creat: 1 mg/dL (ref 0.70–1.25)
GFR, Est African American: 90 mL/min/{1.73_m2} (ref >=60)
GFR, Est Non African American: 78 mL/min/{1.73_m2} (ref >=60)
Globulin: 2.5 g/dL (calc) (ref 1.9–3.7)
Glucose, Bld: 107 mg/dL — ABNORMAL HIGH (ref 65–99)
Potassium: 4.4 mmol/L (ref 3.5–5.3)
Sodium: 139 mmol/L (ref 135–146)
Total Bilirubin: 0.6 mg/dL (ref 0.2–1.2)
Total Protein: 7.4 g/dL (ref 6.1–8.1)

## 2019-05-17 LAB — LIPID PANEL
Cholesterol: 172 mg/dL (ref <200)
HDL: 35 mg/dL — ABNORMAL LOW (ref >=40)
Non-HDL Cholesterol (Calc): 137 mg/dL (calc) — ABNORMAL HIGH (ref <130)
Total CHOL/HDL Ratio: 4.9 (calc) (ref <5.0)
Triglycerides: 416 mg/dL — ABNORMAL HIGH (ref <150)

## 2019-05-17 LAB — URIC ACID: Uric Acid, Serum: 6.4 mg/dL (ref 4.0–8.0)

## 2019-05-17 LAB — URINALYSIS, ROUTINE W REFLEX MICROSCOPIC
Bilirubin Urine: NEGATIVE
Glucose, UA: NEGATIVE
Hgb urine dipstick: NEGATIVE
Ketones, ur: NEGATIVE
Leukocytes,Ua: NEGATIVE
Nitrite: NEGATIVE
Protein, ur: NEGATIVE
Specific Gravity, Urine: 1.019 (ref 1.001–1.03)
pH: 7 (ref 5.0–8.0)

## 2019-05-17 LAB — CBC WITH DIFFERENTIAL/PLATELET
Absolute Monocytes: 555 cells/uL (ref 200–950)
Basophils Absolute: 110 cells/uL (ref 0–200)
Basophils Relative: 1.8 %
Eosinophils Absolute: 61 cells/uL (ref 15–500)
Eosinophils Relative: 1 %
HCT: 43 % (ref 38.5–50.0)
Hemoglobin: 14.4 g/dL (ref 13.2–17.1)
Lymphs Abs: 1482 cells/uL (ref 850–3900)
MCH: 31.6 pg (ref 27.0–33.0)
MCHC: 33.5 g/dL (ref 32.0–36.0)
MCV: 94.3 fL (ref 80.0–100.0)
MPV: 10.7 fL (ref 7.5–12.5)
Monocytes Relative: 9.1 %
Neutro Abs: 3892 cells/uL (ref 1500–7800)
Neutrophils Relative %: 63.8 %
Platelets: 296 10*3/uL (ref 140–400)
RBC: 4.56 10*6/uL (ref 4.20–5.80)
RDW: 12.1 % (ref 11.0–15.0)
Total Lymphocyte: 24.3 %
WBC: 6.1 10*3/uL (ref 3.8–10.8)

## 2019-05-17 LAB — VITAMIN D 25 HYDROXY (VIT D DEFICIENCY, FRACTURES): Vit D, 25-Hydroxy: 26 ng/mL — ABNORMAL LOW (ref 30–100)

## 2019-05-17 LAB — PSA: PSA: 1.6 ng/mL (ref <=4.0)

## 2019-05-17 LAB — MAGNESIUM: Magnesium: 2 mg/dL (ref 1.5–2.5)

## 2019-05-17 LAB — HEMOGLOBIN A1C
Hgb A1c MFr Bld: 6.3 % of total Hgb — ABNORMAL HIGH (ref <5.7)
Mean Plasma Glucose: 134 (calc)
eAG (mmol/L): 7.4 (calc)

## 2019-05-17 LAB — INSULIN, RANDOM: Insulin: 25.5 u[IU]/mL — ABNORMAL HIGH

## 2019-05-17 LAB — TSH: TSH: 1.88 mIU/L (ref 0.40–4.50)

## 2019-05-17 MED ORDER — TADALAFIL 20 MG PO TABS: ORAL_TABLET | ORAL | 99 refills | Status: DC

## 2019-05-29 NOTE — Progress Notes (Signed)
Subjective:    Patient ID: Roger Mcclain, male    DOB: [birth year], 68 y.o.   MRN: [id number]  HPI         This very nice 68 y.o. MWM with  HTN, HLD, T2_NIDDM  and Vitamin D Deficiency presents  With a tender cyst of the lateral DIP of the Lt middle or Long Finger. H denies any HA's, dizziness, CP, palpitations, dyspnea or edema.   Medication Sig  . allopurinol  300 MG tablet Take 1 tablet daily to prevent Gout Attack  . VITAMIN D  Take 1,000 Units by mouth daily.  . TYLENOL PM)25-500 MG  Take 1 tablet by mouth at bedtime as needed.  . fenofibrate ) 134 MG capsule Take 1 capsule (134 mg total) by mouth daily before breakfast.  . gabapentin  100 MG capsule Take 1-2 capsules at bedtime.  Marland Kitchen losartan-hctz 100-25 MG  Take 1/2 to 1   tablet daily for BP  . meloxicam  15 MG tablet Take one daily with food for 2 weeks, can take with tylenol, can not take with aleve, iburpofen, then as needed daily for pain  . metFORMIN-XR 500 MG  Take 1 tablet (500 mg total) by mouth 2 (two) times daily before a meal.  . Multiple Vitamin  Take 1 tablet by mouth daily.  . OTC Flonase  2 sprays per nostril every evening  . phentermine  37.5 MG tablet Take 1/2 to 1 tablet every Morning for Dieting & Weight Loss  . rosuvastatin 40 MG tablet Take 1 tablet Daily for Cholesterol  . tadalafil 20 MG tablet Take 1/2 to 1 tablet every 2 to 3 days as needed for XXXX  . topiramate 50 MG tablet Take 1/2 to 1 tablet 2 x /day at Suppertime & Bedtime for Dieting & Weight Loss   No Known Allergies   Past Medical History:  Diagnosis Date  . Diabetes mellitus without complication ([place]) 0000000  . Hyperlipidemia 2017  . Hypertension 2010     No past surgical history on file.  Review of Systems  10 point systems review negative except as above.    Objective:   Physical Exam  BP 132/84   Pulse 64   Temp (!) 97 F (36.1 C)   Resp 16   Ht 6\' 2"  (1.88 m)   Wt 285 lb 6.4 oz (129.5 kg)   BMI 36.64 kg/m   HEENT -  WNL. Neck - supple.  Chest - Clear equal BS. Cor - Nl HS. RRR w/o sig MGR. PP 1(+). No edema. MS- FROM w/o deformities.  Gait Nl. Neuro -  Nl w/o focal abnormalities. Skin - there is a tender cyst approx 7 mm of the lateral aspect of the DIP of the Lt middle or long finger   Procedure ( CPT: 10060)     After informed consent and aseptic prep with alcohol the area of the cyst of the distal long finger was anesthetized with 1 ml of Marcaine 0.5% subcut, Then with a #18 scalpel the cyst was incised and approx 1 cc of clear mucinous material was expressed. Then the incision was widened in a trap-door fashion and the inner cavity was obliterated with the hyfrecator,. Then the shin flap was aligned and secured with #3 steristrips to maintain alignment. Patient was instructed in pot-op wound care.     Assessment & Plan:   1. Essential hypertension  2. Digital mucinous cyst of finger  -  (Procedure CPT 10060)

## 2019-05-30 ENCOUNTER — Ambulatory Visit (INDEPENDENT_AMBULATORY_CARE_PROVIDER_SITE_OTHER): Payer: Medicare HMO | Admitting: Internal Medicine

## 2019-05-30 ENCOUNTER — Other Ambulatory Visit: Payer: Self-pay

## 2019-05-30 VITALS — BP 132/84 | HR 64 | Temp 97.0°F | Resp 16 | Ht 74.0 in | Wt 285.4 lb

## 2019-05-30 DIAGNOSIS — M67449 Ganglion, unspecified hand: Secondary | ICD-10-CM

## 2019-05-30 DIAGNOSIS — I10 Essential (primary) hypertension: Secondary | ICD-10-CM | POA: Diagnosis not present

## 2019-06-19 ENCOUNTER — Other Ambulatory Visit: Payer: Self-pay | Admitting: Internal Medicine

## 2019-06-19 DIAGNOSIS — E782 Mixed hyperlipidemia: Secondary | ICD-10-CM

## 2019-06-19 DIAGNOSIS — I10 Essential (primary) hypertension: Secondary | ICD-10-CM

## 2019-06-19 DIAGNOSIS — E1122 Type 2 diabetes mellitus with diabetic chronic kidney disease: Secondary | ICD-10-CM

## 2019-06-19 MED ORDER — LOSARTAN POTASSIUM-HCTZ 100-25 MG PO TABS: ORAL_TABLET | ORAL | 3 refills | Status: DC

## 2019-06-19 MED ORDER — FENOFIBRATE 134 MG PO CAPS: ORAL_CAPSULE | ORAL | 3 refills | Status: DC

## 2019-06-19 MED ORDER — METFORMIN HCL ER 500 MG PO TB24: ORAL_TABLET | ORAL | 3 refills | Status: DC

## 2019-08-16 DIAGNOSIS — E1122 Type 2 diabetes mellitus with diabetic chronic kidney disease: Secondary | ICD-10-CM | POA: Insufficient documentation

## 2019-08-16 NOTE — Progress Notes (Signed)
FOLLOW UP  Assessment and Plan:   Hypertension Well controlled with current medications  Monitor blood pressure at home; patient to call if consistently greater than 130/80 Continue DASH diet.   Reminder to go to the ER if any CP, SOB, nausea, dizziness, severe HA, changes vision/speech, left arm numbness and tingling and jaw pain.  Cholesterol Currently above goal; continue rosuvastatin, fenofibrate; diet for trigs discussed Continue low cholesterol diet and exercise.  Check lipid panel.   Diabetes with diabetic chronic kidney disease Continue medication: metformin Continue diet and exercise.  Perform daily foot/skin check, notify office of any concerning changes.  Check A1C  CKD 1 associated with T2DM Increase fluids, avoid NSAIDS, monitor sugars, will monitor  Morbid obesity - BMI 36 with co morbidities Long discussion about weight loss, diet, and exercise Recommended diet heavy in fruits and veggies and low in animal meats, cheeses, and dairy products, appropriate calorie intake Discussed ideal weight for height  Patient will work on portions of potatoes, rice, beer Will follow up in 3 months  Vitamin D Def Below goal at last visit; he has changed dose continue supplementation to maintain goal of 70-100 Check Vit D level  Gout Continue allopurinol Diet discussed Check uric acid as needed  Elevated LTFs Trending up, elevated GGT in 07/2018 and was recommended hepatitis panel and [place] but never got Recheck CMP for LFTs, hepatitis panel, [place] avoid tylenol, alcohol, weight loss advised.   Need for influenza vaccine -high dose quadrivalent administered without complication  Continue diet and meds as discussed. Further disposition pending results of labs. Discussed med's effects and SE's.   Over 30 minutes of exam, counseling, chart review, and critical decision making was performed.   Future Appointments  Date Time Provider [place]  11/21/2019  9:30 AM  Unk Pinto, MD [facility] None  02/19/2020  9:00 AM Vicie Mutters, PA-C [facility] None  05/23/2020  9:00 AM Unk Pinto, MD [facility] None  06/27/2020  2:00 PM Unk Pinto, MD [facility] None    ----------------------------------------------------------------------------------------------------------------------  HPI 68 y.o. male  presents for 3 month follow up on hypertension, cholesterol, diabetes, obesity and vitamin D deficiency.   he is prescribed phentermine and topamax but never started, strong patient preference to avoid adding medication for weight choice.   BMI is Body mass index is 36.59 kg/m., he has been working on diet and exercise, eats mainly vegetables, manages 6-8 properties/yardwork. Reports he has essentially cut bread and starches but ddmits he likes potatoes and the occasional beer and struggles during the holidays. He has recently cut out sweetened creamer and feeling much better. Drinks 5-6 bottles of water daily. He feels he can lose weight by lifestyle only, his goal is <262 lb Wt Readings from Last 3 Encounters:  08/17/19 285 lb (129.3 kg)  05/30/19 285 lb 6.4 oz (129.5 kg)  05/16/19 285 lb 6.4 oz (129.5 kg)   His blood pressure has been controlled at home, today their BP is BP: 128/80  He does not workout but works intense job. He denies chest pain, shortness of breath, dizziness.   He is on cholesterol medication Rosuvastatin 40 mg daily and fenofibrate 134 mg daily and denies myalgias. His cholesterol is not at goal. The cholesterol last visit was:   Lab Results  Component Value Date   CHOL 172 05/16/2019   HDL 35 (L) 05/16/2019   [place]  05/16/2019     Comment:     . LDL cholesterol not calculated. Triglyceride levels greater than 400  mg/dL invalidate calculated LDL results. . Reference range: <100 . Desirable range <100 mg/dL for primary prevention;   <70 mg/dL for patients with CHD or diabetic patients  with > or = 2 CHD risk  factors. Marland Kitchen LDL-C is now calculated using the Martin-Hopkins  calculation, which is a validated novel method providing  better accuracy than the Friedewald equation in the  estimation of LDL-C.  Cresenciano Genre et al. Annamaria Helling. 6759;163(84): 2061-2068  (http://education.[employer].com/faq/FAQ164)    TRIG 416 (H) 05/16/2019   CHOLHDL 4.9 05/16/2019    He has been working on diet and exercise for T2DM well controlled by lifestyle and metformin 1000 mg daily, and denies foot ulcerations, increased appetite, nausea, paresthesia of the feet, polydipsia, polyuria and visual disturbances. He does check sugars around 7 pm and typically runs 120-130. Last A1C in the office was:  Lab Results  Component Value Date   HGBA1C 6.3 (H) 05/16/2019   Patient is on Vitamin D supplement and increased dose after last visit, now taking 5000 x 2 tab:  Lab Results  Component Value Date   VD25OH 26 (L) 05/16/2019     Patient is on allopurinol for gout (reports he takes intermittently) and does not report a recent flare.  Lab Results  Component Value Date   LABURIC 6.4 05/16/2019   He has had LFT elevations trending up; GGT on 07/21/2018 was 93; iron and saturation were low with elevated ferritin; he has not had a hepatitis panel or any recent abdominal imaging.  Lab Results  Component Value Date   ALT 107 (H) 05/16/2019   AST 79 (H) 05/16/2019   ALKPHOS 49 12/24/2016   BILITOT 0.6 05/16/2019     Current Medications:  Current Outpatient Medications on File Prior to Visit  Medication Sig  . ACCU-CHEK SOFTCLIX LANCETS lancets Use once daily with Accu-Chek meter to check BS reading. Dx. E11.9  . allopurinol (ZYLOPRIM) 300 MG tablet Take 1 tablet daily to prevent Gout Attack  . Blood Glucose Monitoring Suppl (ACCU-CHEK AVIVA PLUS) w/Device KIT Use daily to check BS.  Dx. E11.9  . Cholecalciferol (VITAMIN D PO) Take 1,000 Units by mouth daily.  . diphenhydramine-acetaminophen (TYLENOL PM) 25-500 MG TABS  tablet Take 1 tablet by mouth at bedtime as needed.  . fenofibrate micronized (LOFIBRA) 134 MG capsule Take 1 capsule Daily for Triglycerides (Blood Fats)  . gabapentin (NEURONTIN) 100 MG capsule Take 1-2 capsules at bedtime.  Marland Kitchen glucose blood test strip Use once daily with Accu-Chek meter to check BS. Dx. E11.9  . losartan-hydrochlorothiazide (HYZAAR) 100-25 MG tablet Take 1/2 to 1  tablet daily for BP  . metFORMIN (GLUCOPHAGE-XR) 500 MG 24 hr tablet Take 1 to 2 tablets 2 x /day with Meals for Diabetes  . Multiple Vitamin (MULTIVITAMIN) tablet Take 1 tablet by mouth daily.  Marland Kitchen OVER THE COUNTER MEDICATION OTC Flonase 2 sprays per nostril every evening  . rosuvastatin (CRESTOR) 40 MG tablet Take 1 tablet Daily for Cholesterol  . tadalafil (CIALIS) 20 MG tablet Take 1/2 to 1 tablet every 2 to 3 days as needed for XXXX   No current facility-administered medications on file prior to visit.      Allergies: No Known Allergies   Medical History:  Past Medical History:  Diagnosis Date  . Diabetes mellitus without complication ([place]) 6659  . Hyperlipidemia 2017  . Hypertension 2010   Family history- Reviewed and unchanged Social history- Reviewed and unchanged   Review of Systems:  Review of Systems  Constitutional:  Negative for malaise/fatigue and weight loss.  HENT: Negative for hearing loss and tinnitus.   Eyes: Negative for blurred vision and double vision.  Respiratory: Negative for cough, shortness of breath and wheezing.   Cardiovascular: Negative for chest pain, palpitations, orthopnea, claudication and leg swelling.  Gastrointestinal: Negative for abdominal pain, blood in stool, constipation, diarrhea, heartburn, melena, nausea and vomiting.  Genitourinary: Negative.   Musculoskeletal: Negative for joint pain and myalgias.  Skin: Negative for rash.  Neurological: Negative for dizziness, tingling, sensory change, weakness and headaches.  Endo/Heme/Allergies: Negative for  polydipsia.  Psychiatric/Behavioral: Negative.   All other systems reviewed and are negative.     Physical Exam: BP 128/80   Pulse 65   Temp (!) 97.5 F (36.4 C)   Ht '6\' 2"'$  (1.88 m)   Wt 285 lb (129.3 kg)   SpO2 98%   BMI 36.59 kg/m  Wt Readings from Last 3 Encounters:  08/17/19 285 lb (129.3 kg)  05/30/19 285 lb 6.4 oz (129.5 kg)  05/16/19 285 lb 6.4 oz (129.5 kg)   General Appearance: Well nourished, in no apparent distress. Eyes: PERRLA, EOMs, conjunctiva no swelling or erythema Sinuses: No Frontal/maxillary tenderness ENT/Mouth: Ext aud canals clear, TMs without erythema, bulging. No erythema, swelling, or exudate on post pharynx.  Tonsils not swollen or erythematous. Hearing normal.  Neck: Supple, thyroid normal.  Respiratory: Respiratory effort normal, BS equal bilaterally without rales, rhonchi, wheezing or stridor.  Cardio: RRR with no MRGs. Brisk peripheral pulses without edema.  Abdomen: Soft, + BS.  Non tender, no guarding, rebound, hernias, masses. Lymphatics: Non tender without lymphadenopathy.  Musculoskeletal: Full ROM, 5/5 strength, Normal gait Skin: Warm, dry without rashes, lesions, ecchymosis.  Neuro: Cranial nerves intact. No cerebellar symptoms.  Psych: Awake and oriented X 3, normal affect, Insight and Judgment appropriate.    Izora Ribas, NP 8:57 AM [facility]

## 2019-08-17 ENCOUNTER — Ambulatory Visit (INDEPENDENT_AMBULATORY_CARE_PROVIDER_SITE_OTHER): Payer: Medicare HMO | Admitting: Adult Health

## 2019-08-17 ENCOUNTER — Other Ambulatory Visit: Payer: Self-pay

## 2019-08-17 ENCOUNTER — Encounter: Payer: Self-pay | Admitting: Adult Health

## 2019-08-17 VITALS — BP 128/80 | HR 65 | Temp 97.5°F | Ht 74.0 in | Wt 285.0 lb

## 2019-08-17 DIAGNOSIS — I10 Essential (primary) hypertension: Secondary | ICD-10-CM

## 2019-08-17 DIAGNOSIS — R7989 Other specified abnormal findings of blood chemistry: Secondary | ICD-10-CM

## 2019-08-17 DIAGNOSIS — N181 Chronic kidney disease, stage 1: Secondary | ICD-10-CM

## 2019-08-17 DIAGNOSIS — E785 Hyperlipidemia, unspecified: Secondary | ICD-10-CM | POA: Diagnosis not present

## 2019-08-17 DIAGNOSIS — M1A9XX0 Chronic gout, unspecified, without tophus (tophi): Secondary | ICD-10-CM

## 2019-08-17 DIAGNOSIS — E559 Vitamin D deficiency, unspecified: Secondary | ICD-10-CM | POA: Diagnosis not present

## 2019-08-17 DIAGNOSIS — Z23 Encounter for immunization: Secondary | ICD-10-CM

## 2019-08-17 DIAGNOSIS — E1169 Type 2 diabetes mellitus with other specified complication: Secondary | ICD-10-CM

## 2019-08-17 DIAGNOSIS — Z1159 Encounter for screening for other viral diseases: Secondary | ICD-10-CM

## 2019-08-17 DIAGNOSIS — E1122 Type 2 diabetes mellitus with diabetic chronic kidney disease: Secondary | ICD-10-CM

## 2019-08-17 NOTE — Addendum Note (Signed)
Addended by: Izora Ribas on: 08/17/2019 09:23 AM   Modules accepted: Orders

## 2019-08-17 NOTE — Patient Instructions (Addendum)
Goals    . HEMOGLOBIN A1C < 5.7    . Weight (lb) < 262 lb (118.8 kg)         High Triglycerides Eating Plan Triglycerides are a type of fat in the blood. High levels of triglycerides can increase your risk of heart disease and stroke. If your triglyceride levels are high, choosing the right foods can help lower your triglycerides and keep your heart healthy. Work with your health care provider or a diet and nutrition specialist (dietitian) to develop an eating plan that is right for you. What are tips for following this plan? General guidelines   Lose weight, if you are overweight. For most people, losing 5-10 lbs (2-5 kg) helps lower triglyceride levels. A weight-loss plan may include. ? 30 minutes of exercise at least 5 days a week. ? Reducing the amount of calories, sugar, and fat you eat.  Eat a wide variety of fresh fruits, vegetables, and whole grains. These foods are high in fiber.  Eat foods that contain healthy fats, such as fatty fish, nuts, seeds, and olive oil.  Avoid foods that are high in added sugar, added salt (sodium), saturated fat, and trans fat.  Avoid low-fiber, refined carbohydrates such as white bread, crackers, noodles, and white rice.  Avoid foods with partially hydrogenated oils (trans fats), such as fried foods or stick margarine.  Limit alcohol intake to no more than 1 drink a day for nonpregnant women and 2 drinks a day for men. One drink equals 12 oz of beer, 5 oz of wine, or 1 oz of hard liquor. Your health care provider may recommend that you drink less depending on your overall health. Reading food labels  Check food labels for the amount of saturated fat. Choose foods with no or very little saturated fat.  Check food labels for the amount of trans fat. Choose foods with no trans fat.  Check food labels for the amount of cholesterol. Choose foods low in cholesterol. Ask your dietitian how much cholesterol you should have each day.  Check food  labels for the amount of sodium. Choose foods with less than 140 milligrams (mg) per serving. Shopping  Buy dairy products labeled as nonfat (skim) or low-fat (1%).  Avoid buying processed or prepackaged foods. These are often high in added sugar, sodium, and fat. Cooking  Choose healthy fats when cooking, such as olive oil or canola oil.  Cook foods using lower fat methods, such as baking, broiling, boiling, or grilling.  Make your own sauces, dressings, and marinades when possible, instead of buying them. Store-bought sauces, dressings, and marinades are often high in sodium and sugar. Meal planning  Eat more home-cooked food and less restaurant, buffet, and fast food.  Eat fatty fish at least 2 times each week. Examples of fatty fish include salmon, trout, mackerel, tuna, and herring.  If you eat whole eggs, do not eat more than 3 egg yolks per week. What foods are recommended? The items listed may not be a complete list. Talk with your dietitian about what dietary choices are best for you. Grains Whole wheat or whole grain breads, crackers, cereals, and pasta. Unsweetened oatmeal. Bulgur. Barley. Quinoa. Brown rice. Whole wheat flour tortillas. Vegetables Fresh or frozen vegetables. Low-sodium canned vegetables. Fruits All fresh, canned (in natural juice), or frozen fruits. Meats and other protein foods Skinless chicken or [place]. Ground chicken or [place]. Lean cuts of pork, trimmed of fat. Fish and seafood, especially salmon, trout, and herring. Egg whites.  Dried beans, peas, or lentils. Unsalted nuts or seeds. Unsalted canned beans. Natural peanut or almond butter. Dairy Low-fat dairy products. Skim or low-fat (1%) milk. Reduced fat (2%) and low-sodium cheese. Low-fat ricotta cheese. Low-fat cottage cheese. Plain, low-fat yogurt. Fats and oils Tub margarine without trans fats. Light or reduced-fat mayonnaise. Light or reduced-fat salad dressings. Avocado. Safflower, olive,  sunflower, soybean, and canola oils. What foods are not recommended? The items listed may not be a complete list. Talk with your dietitian about what dietary choices are best for you. Grains White bread. White (regular) pasta. White rice. Cornbread. Bagels. Pastries. Crackers that contain trans fat. Vegetables Creamed or fried vegetables. Vegetables in a cheese sauce. Fruits Sweetened dried fruit. Canned fruit in syrup. Fruit juice. Meats and other protein foods Fatty cuts of meat. Ribs. Chicken wings. Berniece Salines. Sausage. Bologna. Salami. Chitterlings. Fatback. Hot dogs. Bratwurst. Packaged lunch meats. Dairy Whole or reduced-fat (2%) milk. Half-and-half. Cream cheese. Full-fat or sweetened yogurt. Full-fat cheese. Nondairy creamers. Whipped toppings. Processed cheese or cheese spreads. Cheese curds. Beverages Alcohol. Sweetened drinks, such as soda, lemonade, fruit drinks, or punches. Fats and oils Butter. Stick margarine. Lard. Shortening. Ghee. Bacon fat. Tropical oils, such as coconut, palm kernel, or palm oils. Sweets and desserts Corn syrup. Sugars. Honey. Molasses. Candy. Jam and jelly. Syrup. Sweetened cereals. Cookies. Pies. Cakes. Donuts. Muffins. Ice cream. Condiments Store-bought sauces, dressings, and marinades that are high in sugar, such as ketchup and barbecue sauce. Summary  High levels of triglycerides can increase the risk of heart disease and stroke. Choosing the right foods can help lower your triglycerides.  Eat plenty of fresh fruits, vegetables, and whole grains. Choose low-fat dairy and lean meats. Eat fatty fish at least twice a week.  Avoid processed and prepackaged foods with added sugar, sodium, saturated fat, and trans fat.  If you need suggestions or have questions about what types of food are good for you, talk with your health care provider or a dietitian. This information is not intended to replace advice given to you by your health care provider. Make sure  you discuss any questions you have with your health care provider. Document Released: 07/09/2004 Document Revised: 09/03/2017 Document Reviewed: 11/24/2016 Elsevier Patient Education  2020 [employer].

## 2019-08-18 LAB — COMPLETE METABOLIC PANEL WITH GFR
AG Ratio: 1.6 (calc) (ref 1.0–2.5)
ALT: 104 U/L — ABNORMAL HIGH (ref 9–46)
AST: 76 U/L — ABNORMAL HIGH (ref 10–35)
Albumin: 4.6 g/dL (ref 3.6–5.1)
Alkaline phosphatase (APISO): 51 U/L (ref 35–144)
BUN: 14 mg/dL (ref 7–25)
CO2: 22 mmol/L (ref 20–32)
Calcium: 10 mg/dL (ref 8.6–10.3)
Chloride: 104 mmol/L (ref 98–110)
Creat: 0.78 mg/dL (ref 0.70–1.25)
GFR, Est African American: 108 mL/min/{1.73_m2} (ref >=60)
GFR, Est Non African American: 93 mL/min/{1.73_m2} (ref >=60)
Globulin: 2.9 g/dL (calc) (ref 1.9–3.7)
Glucose, Bld: 108 mg/dL — ABNORMAL HIGH (ref 65–99)
Potassium: 4.1 mmol/L (ref 3.5–5.3)
Sodium: 138 mmol/L (ref 135–146)
Total Bilirubin: 0.4 mg/dL (ref 0.2–1.2)
Total Protein: 7.5 g/dL (ref 6.1–8.1)

## 2019-08-18 LAB — MAGNESIUM: Magnesium: 1.8 mg/dL (ref 1.5–2.5)

## 2019-08-18 LAB — CBC WITH DIFFERENTIAL/PLATELET
Absolute Monocytes: 670 cells/uL (ref 200–950)
Basophils Absolute: 101 cells/uL (ref 0–200)
Basophils Relative: 1.5 %
Eosinophils Absolute: 80 cells/uL (ref 15–500)
Eosinophils Relative: 1.2 %
HCT: 41.8 % (ref 38.5–50.0)
Hemoglobin: 13.7 g/dL (ref 13.2–17.1)
Lymphs Abs: 1568 cells/uL (ref 850–3900)
MCH: 31.1 pg (ref 27.0–33.0)
MCHC: 32.8 g/dL (ref 32.0–36.0)
MCV: 94.8 fL (ref 80.0–100.0)
MPV: 10.6 fL (ref 7.5–12.5)
Monocytes Relative: 10 %
Neutro Abs: 4281 cells/uL (ref 1500–7800)
Neutrophils Relative %: 63.9 %
Platelets: 276 10*3/uL (ref 140–400)
RBC: 4.41 10*6/uL (ref 4.20–5.80)
RDW: 12 % (ref 11.0–15.0)
Total Lymphocyte: 23.4 %
WBC: 6.7 10*3/uL (ref 3.8–10.8)

## 2019-08-18 LAB — HEMOGLOBIN A1C
Hgb A1c MFr Bld: 6.4 % of total Hgb — ABNORMAL HIGH (ref <5.7)
Mean Plasma Glucose: 137 (calc)
eAG (mmol/L): 7.6 (calc)

## 2019-08-18 LAB — HEPATITIS C ANTIBODY
Hepatitis C Ab: NONREACTIVE
SIGNAL TO CUT-OFF: 0.02 (ref <1.00)

## 2019-08-18 LAB — LIPID PANEL
Cholesterol: 118 mg/dL (ref <200)
HDL: 31 mg/dL — ABNORMAL LOW (ref >=40)
LDL Cholesterol (Calc): 62 mg/dL (calc)
Non-HDL Cholesterol (Calc): 87 mg/dL (calc) (ref <130)
Total CHOL/HDL Ratio: 3.8 (calc) (ref <5.0)
Triglycerides: 171 mg/dL — ABNORMAL HIGH (ref <150)

## 2019-08-18 LAB — TSH: TSH: 1.3 mIU/L (ref 0.40–4.50)

## 2019-08-18 LAB — VITAMIN D 25 HYDROXY (VIT D DEFICIENCY, FRACTURES): Vit D, 25-Hydroxy: 45 ng/mL (ref 30–100)

## 2019-09-06 ENCOUNTER — Other Ambulatory Visit: Payer: Self-pay

## 2019-09-18 ENCOUNTER — Other Ambulatory Visit: Payer: Self-pay | Admitting: *Deleted

## 2019-09-18 DIAGNOSIS — Z79899 Other long term (current) drug therapy: Secondary | ICD-10-CM

## 2019-09-18 MED ORDER — GABAPENTIN 100 MG PO CAPS: ORAL_CAPSULE | ORAL | 1 refills | Status: DC

## 2019-09-25 ENCOUNTER — Other Ambulatory Visit: Payer: Self-pay | Admitting: Internal Medicine

## 2019-09-25 ENCOUNTER — Telehealth: Payer: Self-pay | Admitting: *Deleted

## 2019-09-25 MED ORDER — AZITHROMYCIN 250 MG PO TABS: ORAL_TABLET | ORAL | 0 refills | Status: DC

## 2019-09-25 NOTE — Telephone Encounter (Signed)
patient called and requested an RX for sinus pressure and pain.  Dr Melford Aase sent in an RX for a Z-pak to the patient's pharmacy.

## 2019-10-04 ENCOUNTER — Telehealth: Payer: Medicare HMO | Admitting: Physician Assistant

## 2019-10-04 DIAGNOSIS — J3489 Other specified disorders of nose and nasal sinuses: Secondary | ICD-10-CM | POA: Diagnosis not present

## 2019-10-04 DIAGNOSIS — U071 COVID-19: Secondary | ICD-10-CM

## 2019-10-04 DIAGNOSIS — R05 Cough: Secondary | ICD-10-CM

## 2019-10-04 DIAGNOSIS — R059 Cough, unspecified: Secondary | ICD-10-CM

## 2019-10-04 MED ORDER — DOXYCYCLINE HYCLATE 100 MG PO CAPS: ORAL_CAPSULE | ORAL | 0 refills | Status: DC

## 2019-10-04 MED ORDER — DEXAMETHASONE 0.5 MG PO TABS: ORAL_TABLET | ORAL | 0 refills | Status: DC

## 2019-10-04 MED ORDER — PROMETHAZINE-DM 6.25-15 MG/5ML PO SYRP: 5.0000 mL | ORAL_SOLUTION | Freq: Four times a day (QID) | ORAL | 1 refills | Status: DC | PRN

## 2019-10-04 NOTE — Telephone Encounter (Signed)
THIS ENCOUNTER IS A VIRTUAL VISIT DUE TO COVID-19 - PATIENT WAS NOT SEEN IN THE OFFICE.  PATIENT HAS CONSENTED TO VIRTUAL VISIT / TELEMEDICINE VISIT   Virtual Visit via telephone Note  I connected with Roger Mcclain on 10/04/2019 by telephone. 68  I verified that I am speaking with the correct person using two identifiers.    I discussed the limitations of evaluation and management by telemedicine and the availability of in person appointments. The patient expressed understanding and agreed to proceed.  History of Present Illness: 68 y.o. obese WM with history of HTN, DM, vitamin D, CKD stage 1  tested positive for COVID on 09/26/19. He took a zpak 09/25/2019.  He is still having sinus issues, a lot of pressure in his sinuses and a cough with green mucus. He is weak, taking tylenol that helps some.   No SOB, no CP, no swelling in his legs.   has Diabetes mellitus ([place]); Essential hypertension; Hyperlipidemia associated with type 2 diabetes mellitus ([place]); Vitamin D deficiency; Morbid obesity ([place]); Gout; Elevated LFTs; and CKD stage 1 due to type 2 diabetes mellitus ([facility]) on their problem list.   Medications  Current Outpatient Medications (Endocrine & Metabolic):  .  metFORMIN (GLUCOPHAGE-XR) 500 MG 24 hr tablet, Take 1 to 2 tablets 2 x /day with Meals for Diabetes  Current Outpatient Medications (Cardiovascular):  .  fenofibrate micronized (LOFIBRA) 134 MG capsule, Take 1 capsule Daily for Triglycerides (Blood Fats) .  losartan-hydrochlorothiazide (HYZAAR) 100-25 MG tablet, Take 1/2 to 1  tablet daily for BP .  rosuvastatin (CRESTOR) 40 MG tablet, Take 1 tablet Daily for Cholesterol .  tadalafil (CIALIS) 20 MG tablet, Take 1/2 to 1 tablet every 2 to 3 days as needed for XXXX   Current Outpatient Medications (Analgesics):  .  allopurinol (ZYLOPRIM) 300 MG tablet, Take 1 tablet daily to prevent Gout Attack   Current Outpatient Medications (Other):  Marland Kitchen  ACCU-CHEK SOFTCLIX LANCETS  lancets, Use once daily with Accu-Chek meter to check BS reading. Dx. E11.9 .  azithromycin (ZITHROMAX) 250 MG tablet, Take 2 tablets with Food on  Day 1, then 1 tablet Daily with Food for Infection .  Blood Glucose Monitoring Suppl (ACCU-CHEK AVIVA PLUS) w/Device KIT, Use daily to check BS.  Dx. E11.9 .  Cholecalciferol (VITAMIN D PO), Take 10,000 Units by mouth daily. .  diphenhydramine-acetaminophen (TYLENOL PM) 25-500 MG TABS tablet, Take 1 tablet by mouth at bedtime as needed. .  gabapentin (NEURONTIN) 100 MG capsule, Take 1-2 capsules at bedtime. Marland Kitchen  glucose blood test strip, Use once daily with Accu-Chek meter to check BS. Dx. E11.9 .  Multiple Vitamin (MULTIVITAMIN) tablet, Take 1 tablet by mouth daily. Marland Kitchen  OVER THE COUNTER MEDICATION, OTC Flonase 2 sprays per nostril every evening  Problem list He has Diabetes mellitus ([place]); Essential hypertension; Hyperlipidemia associated with type 2 diabetes mellitus ([place]); Vitamin D deficiency; Morbid obesity ([place]); Gout; Elevated LFTs; and CKD stage 1 due to type 2 diabetes mellitus ([place]) on their problem list.   Observations/Objective: General Appearance:Well sounding, in no apparent distress.  ENT/Mouth: No hoarseness, No cough for duration of visit.  Respiratory: completing full sentences without distress, without audible wheeze Neuro: Awake and oriented X 3 Psych:  Insight and Judgment appropriate.   Assessment and Plan: Diagnoses and all orders for this visit:  COVID-19 Follow up as needed via text. Please go to the ER or call 911 if symptoms become worse.   Cough -  doxycycline (VIBRAMYCIN) 100 MG capsule; Take 1 capsule twice daily with food -     dexamethasone (DECADRON) 0.5 MG tablet; take 1 tablet PO BID for 3 days, then take 1 tablet PO for 4 days. -     promethazine-dextromethorphan (PROMETHAZINE-DM) 6.25-15 MG/5ML syrup; Take 5 mLs by mouth 4 (four) times daily as needed for cough.  Sinus pressure -     doxycycline  (VIBRAMYCIN) 100 MG capsule; Take 1 capsule twice daily with food -     dexamethasone (DECADRON) 0.5 MG tablet; take 1 tablet PO BID for 3 days, then take 1 tablet PO for 4 days. -     promethazine-dextromethorphan (PROMETHAZINE-DM) 6.25-15 MG/5ML syrup; Take 5 mLs by mouth 4 (four) times daily as needed for cough.   Follow Up Instructions:  I discussed the assessment and treatment plan with the patient. The patient was provided an opportunity to ask questions and all were answered. The patient agreed with the plan and demonstrated an understanding of the instructions.   The patient was advised to call back or seek an in-person evaluation if the symptoms worsen or if the condition fails to improve as anticipated.   Vicie Mutters, PA-C

## 2019-10-11 ENCOUNTER — Inpatient Hospital Stay: Admission: RE | Admit: 2019-10-11 | Payer: Self-pay | Source: Ambulatory Visit

## 2019-10-31 ENCOUNTER — Other Ambulatory Visit: Payer: Self-pay | Admitting: *Deleted

## 2019-10-31 ENCOUNTER — Other Ambulatory Visit: Payer: Self-pay

## 2019-10-31 ENCOUNTER — Ambulatory Visit (INDEPENDENT_AMBULATORY_CARE_PROVIDER_SITE_OTHER): Payer: Medicare HMO | Admitting: Internal Medicine

## 2019-10-31 VITALS — BP 152/84 | HR 88 | Temp 97.3°F | Resp 18 | Ht 74.0 in | Wt 282.8 lb

## 2019-10-31 DIAGNOSIS — Z8616 Personal history of COVID-19: Secondary | ICD-10-CM | POA: Insufficient documentation

## 2019-10-31 DIAGNOSIS — J0100 Acute maxillary sinusitis, unspecified: Secondary | ICD-10-CM | POA: Diagnosis not present

## 2019-10-31 DIAGNOSIS — L739 Follicular disorder, unspecified: Secondary | ICD-10-CM

## 2019-10-31 DIAGNOSIS — E782 Mixed hyperlipidemia: Secondary | ICD-10-CM

## 2019-10-31 DIAGNOSIS — I10 Essential (primary) hypertension: Secondary | ICD-10-CM

## 2019-10-31 DIAGNOSIS — N181 Chronic kidney disease, stage 1: Secondary | ICD-10-CM

## 2019-10-31 DIAGNOSIS — E1122 Type 2 diabetes mellitus with diabetic chronic kidney disease: Secondary | ICD-10-CM

## 2019-10-31 MED ORDER — FENOFIBRATE 134 MG PO CAPS: ORAL_CAPSULE | ORAL | 3 refills | Status: DC

## 2019-10-31 MED ORDER — MUPIROCIN 2 % EX OINT: TOPICAL_OINTMENT | CUTANEOUS | 0 refills | Status: DC

## 2019-10-31 MED ORDER — DEXAMETHASONE 4 MG PO TABS: ORAL_TABLET | ORAL | 0 refills | Status: DC

## 2019-10-31 MED ORDER — CEPHALEXIN 500 MG PO CAPS: ORAL_CAPSULE | ORAL | 0 refills | Status: DC

## 2019-10-31 MED ORDER — MUPIROCIN CALCIUM 2 % EX CREA: TOPICAL_CREAM | CUTANEOUS | 2 refills | Status: AC

## 2019-10-31 MED ORDER — LOSARTAN POTASSIUM-HCTZ 100-25 MG PO TABS: ORAL_TABLET | ORAL | 3 refills | Status: DC

## 2019-10-31 NOTE — Progress Notes (Signed)
   History of Present Illness:   This very nice 69 yo MWM presents with c/o frontal / maxillary HA, sinus congestion & pressure. Also c/o "bumps" / pustules at nares.   Medications  .  metFORMIN-XR 500 MG 24 hr tablet, Take 1 to 2 tablets 2 x /day with Meals for Diabetes .  rosuvastatin (CRESTOR) 40 MG tablet, Take 1 tablet Daily for Cholesterol .  tadalafil (CIALIS) 20 MG tablet, Take 1/2 to 1 tablet every 2 to 3 days as needed for XXXX .  fenofibrate 134 MG capsule, Take 1 capsule Daily for Triglycerides (Blood Fats) .  losartan-hydrochlorothiazide 100-25 MG tablet, Take 1/2 to 1  tablet daily for BP .  Allopurinol  300 MG tablet, Take 1 tablet daily to prevent Gout Attack .  Ascorbic Acid (VITAMIN C) 1000 MG tablet, Take 1,000 mg by mouth daily. .  Cholecalciferol (VITAMIN D PO), Take 10,000 Units by mouth daily. .  TYLENOL PM 25-500 MG TABS tablet, Take 1 tablet by mouth at bedtime as needed. .  gabapentin (NEURONTIN) 100 MG capsule, Take 1-2 capsules at bedtime. .  Multiple Vitamin , Take 1 tablet by mouth daily. .   Flonase 2 sprays per nostril every evening .  zinc gluconate 50 MG tablet, Take 50 mg by mouth daily.  Problem list He has Diabetes mellitus ([place]); Essential hypertension; Hyperlipidemia associated with type 2 diabetes mellitus ([place]); Vitamin D deficiency; Morbid obesity ([place]); Gout; Elevated LFTs; CKD stage 1 due to type 2 diabetes mellitus ([place]); and COVID-19 on their problem list.   Observations/Objective:  BP (!) 152/84   Pulse 88   Temp (!) 97.3 F (36.3 C)   Resp 18   Ht 6\' 2"  (1.88 m)   Wt 282 lb 12.8 oz (128.3 kg)   BMI 36.31 kg/m   HEENT - EAC's/ TM's - Nl. (+) [place]-Maxillary tenderness / Nares inflamed with tiny pustules.  O/P clear Neck - supple.  Chest - Clear equal BS. Cor - Nl HS. RRR w/o sig MGR. PP 1(+). No edema. MS- FROM w/o deformities.  Gait Nl. Neuro -  Nl w/o focal abnormalities.  Assessment and Plan:  1. Subacute maxillary  sinusitis  - cephALEXin 500 MG capsule; Take 1 capsule 4 x  /day    Dis: 60 capsule;   - dexamethasone 4 MG tab; Take 1 tab 3 x day - 3 days, then 2 x day - 3 days, then 1 tab daily  Disp: 20 tab  2. Nasal folliculitis  - cephALEXin (KEFLEX) 500 MG capsule; Take 1 capsule 4 x  /day with Meals & Bedtime with Food for Infection  Dispense: 60 capsule  - mupirocin cream (BACTROBAN) 2 %; Apply to affected area 3 times daily  Dispense: 30 g; Refill: 2 - mupirocin ointment (BACTROBAN) 2 %; Apply into Nostrils 2 x  /day  Dispense: 22 g; Refill: 0        I discussed the assessment and treatment plan with the patient. The patient was provided an opportunity to ask questions and all were answered. The patient agreed with the plan and demonstrated an understanding of the instructions.  The patient was advised to call back or seek an in-person evaluation if the symptoms worsen or if the condition fails to improve as anticipated.  Kirtland Bouchard, MD

## 2019-11-04 ENCOUNTER — Encounter: Payer: Self-pay | Admitting: Internal Medicine

## 2019-11-20 ENCOUNTER — Encounter: Payer: Self-pay | Admitting: Internal Medicine

## 2019-11-20 NOTE — Patient Instructions (Addendum)
- Link to sign up at [employer] site for the Covid-19 vaccine   [email or web address].[email or web address]   Or call [phone]  +++++++++++++++++++++++++++++++++  Vit D  & Vit C 1,000 mg   are recommended to help protect  against the Covid-19 and other Corona viruses.    Also it's recommended  to take  Zinc 50 mg  to help  protect against the Covid-19   and best place to get  is also on [employer].com  and don't pay more than 6-8 cents /pill !  ================================ Coronavirus (COVID-19) Are you at risk?  Are you at risk for the Coronavirus (COVID-19)?  To be considered HIGH RISK for Coronavirus (COVID-19), you have to meet the following criteria:  . Traveled to [place], [place], [place], [place] or [place]; or in the [place] to [place], [place], [place]  . or [place]; and have fever, cough, and shortness of breath within the last 2 weeks of travel OR . Been in close contact with a person diagnosed with COVID-19 within the last 2 weeks and have  . fever, cough,and shortness of breath .  . IF YOU DO NOT MEET THESE CRITERIA, YOU ARE CONSIDERED LOW RISK FOR COVID-19.  What to do if you are HIGH RISK for COVID-19?  Marland Kitchen If you are having a medical emergency, call 911. . Seek medical care right away. Before you go to a doctor's office, urgent care or emergency department, .  call ahead and tell them about your recent travel, contact with someone diagnosed with COVID-19  .  and your symptoms.  . You should receive instructions from your physician's office regarding next steps of care.  . When you arrive at healthcare provider, tell the healthcare staff immediately you have returned from  . visiting [place], [place], [place], [place] or [place]; or traveled in the [place] to [place], [place],  . [place] or [place] in the last two  weeks or you have been in close contact with a person diagnosed with  . COVID-19 in the last 2 weeks.   . Tell the health care staff about your symptoms: fever, cough and shortness of breath. . After you have been seen by a medical provider, you will be either: o Tested for (COVID-19) and discharged home on quarantine except to seek medical care if  o symptoms worsen, and asked to  - Stay home and avoid contact with others until you get your results (4-5 days)  - Avoid travel on public transportation if possible (such as bus, train, or airplane) or o Sent to the Emergency Department by EMS for evaluation, COVID-19 testing  and  o possible admission depending on your condition and test results.  What to do if you are LOW RISK for COVID-19?  Reduce your risk of any infection by using the same precautions used for avoiding the common cold or flu:  Marland Kitchen Wash your hands often with soap and warm water for at least 20 seconds.  If soap and water are not readily available,  . use an alcohol-based hand sanitizer with at least 60% alcohol.  . If coughing or sneezing, cover your mouth and nose by coughing or sneezing into the elbow areas of your shirt or coat, .  into a tissue or into your sleeve (not your hands). . Avoid shaking hands with others and consider head nods or verbal greetings only. . Avoid touching your eyes, nose, or mouth with unwashed hands.  Marland Kitchen  Avoid close contact with people who are sick. . Avoid places or events with large numbers of people in one location, like concerts or sporting events. . Carefully consider travel plans you have or are making. . If you are planning any travel outside or inside the [place], visit the CDC's Travelers' Health webpage for the latest health notices. . If you have some symptoms but not all symptoms, continue to monitor at home and seek medical attention  . if your symptoms worsen. . If you are having a medical emergency, call  911. >>>>>>>>>>>>>>>>>>>>>>>>>>>>>>>>>>>>>>>>>>>>>>>>>>>>>>> We Do NOT Approve of  [facility] [place] Soliciting Our Patients  To Do Home Visits  & We Do NOT Approve of [employer] > > > > > > > > > > > > > > > > > > > > > > > > > > > > > > > > > > >  > > > >   Preventive Care for Adults  A healthy lifestyle and preventive care can promote health and wellness. Preventive health guidelines for men include the following key practices:  A routine yearly physical is a good way to check with your health care provider about your health and preventative screening. It is a chance to share any concerns and updates on your health and to receive a thorough exam.  Visit your dentist for a routine exam and preventative care every 6 months. Brush your teeth twice a day and floss once a day. Good oral hygiene prevents tooth decay and gum disease.  The frequency of eye exams is based on your age, health, family medical history, use of contact lenses, and other factors. Follow your health care provider's recommendations for frequency of eye exams.  Eat a healthy diet. Foods such as vegetables, fruits, whole grains, low-fat dairy products, and lean protein foods contain the nutrients you need without too many calories. Decrease your intake of foods high in solid fats, added sugars, and salt. Eat the right amount of calories for you. Get information about a proper diet from your health care provider, if necessary.  Regular physical exercise is one of the most important things you can do for your health. Most adults should get at least 150 minutes of moderate-intensity exercise (any activity that increases your heart rate and causes you to sweat) each week. In addition, most adults need muscle-strengthening exercises on 2 or more days a week.  Maintain a healthy weight. The body mass index (BMI) is a screening tool to identify possible weight problems. It provides an estimate of body fat  based on height and weight. Your health care provider can find your BMI and can help you achieve or maintain a healthy weight. For adults 20 years and older:  A BMI below 18.5 is considered underweight.  A BMI of 18.5 to 24.9 is normal.  A BMI of 25 to 29.9 is considered overweight.  A BMI of 30 and above is considered obese.  Maintain normal blood lipids and cholesterol levels by exercising and minimizing your intake of saturated fat. Eat a balanced diet with plenty of fruit and vegetables. Blood tests for lipids and cholesterol should begin at age 62 and be repeated every 5 years. If your lipid or cholesterol levels are high, you are over 50, or you are at high risk for heart disease, you may need your cholesterol levels checked more frequently. Ongoing high lipid and cholesterol levels should be treated with medicines if diet and exercise are not  working.  If you smoke, find out from your health care provider how to quit. If you do not use tobacco, do not start.  Lung cancer screening is recommended for adults aged 32-80 years who are at high risk for developing lung cancer because of a history of smoking. A yearly low-dose CT scan of the lungs is recommended for people who have at least a 30-pack-year history of smoking and are a current smoker or have quit within the past 15 years. A pack year of smoking is smoking an average of 1 pack of cigarettes a day for 1 year (for example: 1 pack a day for 30 years or 2 packs a day for 15 years). Yearly screening should continue until the smoker has stopped smoking for at least 15 years. Yearly screening should be stopped for people who develop a health problem that would prevent them from having lung cancer treatment.  If you choose to drink alcohol, do not have more than 2 drinks per day. One drink is considered to be 12 ounces (355 mL) of beer, 5 ounces (148 mL) of wine, or 1.5 ounces (44 mL) of liquor.  Avoid use of street drugs. Do not share  needles with anyone. Ask for help if you need support or instructions about stopping the use of drugs.  High blood pressure causes heart disease and increases the risk of stroke. Your blood pressure should be checked at least every 1-2 years. Ongoing high blood pressure should be treated with medicines, if weight loss and exercise are not effective.  If you are 50-45 years old, ask your health care provider if you should take aspirin to prevent heart disease.  Diabetes screening involves taking a blood sample to check your fasting blood sugar level. Testing should be considered at a younger age or be carried out more frequently if you are overweight and have at least 1 risk factor for diabetes.  Colorectal cancer can be detected and often prevented. Most routine colorectal cancer screening begins at the age of 19 and continues through age 72. However, your health care provider may recommend screening at an earlier age if you have risk factors for colon cancer. On a yearly basis, your health care provider may provide home test kits to check for hidden blood in the stool. Use of a small camera at the end of a tube to directly examine the colon (sigmoidoscopy or colonoscopy) can detect the earliest forms of colorectal cancer. Talk to your health care provider about this at age 69, when routine screening begins. Direct exam of the colon should be repeated every 5-10 years through age 91, unless early forms of precancerous polyps or small growths are found.  Hepatitis C blood testing is recommended for all people born from [birth year] through 1964 and any individual with known risks for hepatitis C.  Screening for abdominal aortic aneurysm (AAA)  by ultrasound is recommended for people who have history of high blood pressure or who are current or former smokers.  Healthy men should  receive prostate-specific antigen (PSA) blood tests as part of routine cancer screening. Talk with your health care provider about  prostate cancer screening.  Testicular cancer screening is  recommended for adult males. Screening includes self-exam, a health care provider exam, and other screening tests. Consult with your health care provider about any symptoms you have or any concerns you have about testicular cancer.  Use sunscreen. Apply sunscreen liberally and repeatedly throughout the day. You should seek shade when your  shadow is shorter than you. Protect yourself by wearing long sleeves, pants, a wide-brimmed hat, and sunglasses year round, whenever you are outdoors.  Once a month, do a whole-body skin exam, using a mirror to look at the skin on your back. Tell your health care provider about new moles, moles that have irregular borders, moles that are larger than a pencil eraser, or moles that have changed in shape or color.  Stay current with required vaccines (immunizations).  Influenza vaccine. All adults should be immunized every year.  Tetanus, diphtheria, and acellular pertussis (Td, Tdap) vaccine. An adult who has not previously received Tdap or who does not know his vaccine status should receive 1 dose of Tdap. This initial dose should be followed by tetanus and diphtheria toxoids (Td) booster doses every 10 years. Adults with an unknown or incomplete history of completing a 3-dose immunization series with Td-containing vaccines should begin or complete a primary immunization series including a Tdap dose. Adults should receive a Td booster every 10 years.  Zoster vaccine. One dose is recommended for adults aged 17 years or older unless certain conditions are present.    PREVNAR - Pneumococcal 13-valent conjugate (PCV13) vaccine. When indicated, a person who is uncertain of his immunization history and has no record of immunization should receive the PCV13 vaccine. An adult aged 10 years or older who has certain medical conditions and has not been previously immunized should receive 1 dose of PCV13 vaccine. This  PCV13 should be followed with a dose of pneumococcal polysaccharide (PPSV23) vaccine. The PPSV23 vaccine dose should be obtained 1 or more year(s)after the dose of PCV13 vaccine. An adult aged 49 years or older who has certain medical conditions and previously received 1 or more doses of PPSV23 vaccine should receive 1 dose of PCV13. The PCV13 vaccine dose should be obtained 1 or more years after the last PPSV23 vaccine dose.    PNEUMOVAX - Pneumococcal polysaccharide (PPSV23) vaccine. When PCV13 is also indicated, PCV13 should be obtained first. All adults aged 26 years and older should be immunized. An adult younger than age 43 years who has certain medical conditions should be immunized. Any person who resides in a nursing home or long-term care facility should be immunized. An adult smoker should be immunized. People with an immunocompromised condition and certain other conditions should receive both PCV13 and PPSV23 vaccines. People with human immunodeficiency virus (HIV) infection should be immunized as soon as possible after diagnosis. Immunization during chemotherapy or radiation therapy should be avoided. Routine use of PPSV23 vaccine is not recommended for American Indians, Honor Natives, or people younger than 65 years unless there are medical conditions that require PPSV23 vaccine. When indicated, people who have unknown immunization and have no record of immunization should receive PPSV23 vaccine. One-time revaccination 5 years after the first dose of PPSV23 is recommended for people aged 19-64 years who have chronic kidney failure, nephrotic syndrome, asplenia, or immunocompromised conditions. People who received 1-2 doses of PPSV23 before age 54 years should receive another dose of PPSV23 vaccine at age 15 years or later if at least 5 years have passed since the previous dose. Doses of PPSV23 are not needed for people immunized with PPSV23 at or after age 78 years.    Hepatitis A vaccine.  Adults who wish to be protected from this disease, have certain high-risk conditions, work with hepatitis A-infected animals, work in hepatitis A research labs, or travel to or work in countries with a high rate of  hepatitis A should be immunized. Adults who were previously unvaccinated and who anticipate close contact with an international adoptee during the first 60 days after arrival in the [place] from a country with a high rate of hepatitis A should be immunized.    Hepatitis B vaccine. Adults should be immunized if they wish to be protected from this disease, have certain high-risk conditions, may be exposed to blood or other infectious body fluids, are household contacts or sex partners of hepatitis B positive people, are clients or workers in certain care facilities, or travel to or work in countries with a high rate of hepatitis B.   Preventive Service / Frequency   Ages 61 and over  Blood pressure check.  Lipid and cholesterol check.  Lung cancer screening. / Every year if you are aged 59-80 years and have a 30-pack-year history of smoking and currently smoke or have quit within the past 15 years. Yearly screening is stopped once you have quit smoking for at least 15 years or develop a health problem that would prevent you from having lung cancer treatment.  Fecal occult blood test (FOBT) of stool. You may not have to do this test if you get a colonoscopy every 10 years.  Flexible sigmoidoscopy** or colonoscopy.** / Every 5 years for a flexible sigmoidoscopy or every 10 years for a colonoscopy beginning at age 4 and continuing until age 8.  Hepatitis C blood test.** / For all people born from [birth year] through 1964 and any individual with known risks for hepatitis C.  Abdominal aortic aneurysm (AAA) screening./ Screening current or former smokers or have Hypertension.  Skin self-exam. / Monthly.  Influenza vaccine. / Every year.  Tetanus, diphtheria, and acellular pertussis  (Tdap/Td) vaccine.** / 1 dose of Td every 10 years.   Zoster vaccine.** / 1 dose for adults aged 38 years or older.         Pneumococcal 13-valent conjugate (PCV13) vaccine.    Pneumococcal polysaccharide (PPSV23) vaccine.     Hepatitis A vaccine.** / Consult your health care provider.  Hepatitis B vaccine.** / Consult your health care provider. Screening for abdominal aortic aneurysm (AAA)  by ultrasound is recommended for people who have history of high blood pressure or who are current or former smokers. ++++++++++ Recommend Adult Low Dose Aspirin or  coated  Aspirin 81 mg daily  To reduce risk of Colon Cancer 40 %,  Skin Cancer 26 % ,  Malignant Melanoma 46%  and  Pancreatic cancer 60% ++++++++++++++++++++++ Vitamin D goal  is between 70-100.  Please make sure that you are taking your Vitamin D as directed.  It is very important as a natural anti-inflammatory  helping hair, skin, and nails, as well as reducing stroke and heart attack risk.  It helps your bones and helps with mood. It also decreases numerous cancer risks so please take it as directed.  Low Vit D is associated with a 200-300% higher risk for CANCER  and 200-300% higher risk for HEART   ATTACK  &  STROKE.   .....................................Marland Kitchen It is also associated with higher death rate at younger ages,  autoimmune diseases like Rheumatoid arthritis, Lupus, Multiple Sclerosis.    Also many other serious conditions, like depression, Alzheimer's Dementia, infertility, muscle aches, fatigue, fibromyalgia - just to name a few. ++++++++++++++++++++++ Recommend the book "The END of DIETING" by Dr Excell Seltzer  & the book "The END of DIABETES " by Dr Excell Seltzer At [facility].[email or web address] - get book &  Audio CD's    Being diabetic has a  300% increased risk for heart attack, stroke, cancer, and alzheimer- type vascular dementia. It is very important that you work harder with diet by avoiding all foods that are white. Avoid  white rice (brown & wild rice is OK), white potatoes (sweetpotatoes in moderation is OK), White bread or wheat bread or anything made out of white flour like bagels, donuts, rolls, buns, biscuits, cakes, pastries, cookies, pizza crust, and pasta (made from white flour & egg whites) - vegetarian pasta or spinach or wheat pasta is OK. Multigrain breads like Arnold's or Pepper[employer], or multigrain sandwich thins or flatbreads.  Diet, exercise and weight loss can reverse and cure diabetes in the early stages.  Diet, exercise and weight loss is very important in the control and prevention of complications of diabetes which affects every system in your body, ie. Brain - dementia/stroke, eyes - glaucoma/blindness, heart - heart attack/heart failure, kidneys - dialysis, stomach - gastric paralysis, intestines - malabsorption, nerves - severe painful neuritis, circulation - gangrene & loss of a leg(s), and finally cancer and Alzheimers.    I recommend avoid fried & greasy foods,  sweets/candy, white rice (brown or wild rice or Quinoa is OK), white potatoes (sweet potatoes are OK) - anything made from white flour - bagels, doughnuts, rolls, buns, biscuits,white and wheat breads, pizza crust and traditional pasta made of white flour & egg white(vegetarian pasta or spinach or wheat pasta is OK).  Multi-grain bread is OK - like multi-grain flat bread or sandwich thins. Avoid alcohol in excess. Exercise is also important.    Eat all the vegetables you want - avoid meat, especially red meat and dairy - especially cheese.  Cheese is the most concentrated form of trans-fats which is the worst thing to clog up our arteries. Veggie cheese is OK which can be found in the fresh produce section at [employer] or [employer] or [employer]  ++++++++++++++++++++++ DASH Eating Plan  DASH stands for "Dietary Approaches to Stop Hypertension."   The DASH eating plan is a healthy eating plan that has been shown to reduce high  blood pressure (hypertension). Additional health benefits may include reducing the risk of type 2 diabetes mellitus, heart disease, and stroke. The DASH eating plan may also help with weight loss. WHAT DO I NEED TO KNOW ABOUT THE DASH EATING PLAN? For the DASH eating plan, you will follow these general guidelines:  Choose foods with a percent daily value for sodium of less than 5% (as listed on the food label).  Use salt-free seasonings or herbs instead of table salt or sea salt.  Check with your health care provider or pharmacist before using salt substitutes.  Eat lower-sodium products, often labeled as "lower sodium" or "no salt added."  Eat fresh foods.  Eat more vegetables, fruits, and low-fat dairy products.  Choose whole grains. Look for the word "whole" as the first word in the ingredient list.  Choose fish   Limit sweets, desserts, sugars, and sugary drinks.  Choose heart-healthy fats.  Eat veggie cheese   Eat more home-cooked food and less restaurant, buffet, and fast food.  Limit fried foods.  Cook foods using methods other than frying.  Limit canned vegetables. If you do use them, rinse them well to decrease the sodium.  When eating at a restaurant, ask that your food be prepared with less salt, or no salt if possible.  WHAT FOODS CAN I EAT? Read Dr Fara Olden Fuhrman's books on The End of Dieting & The End of Diabetes  Grains Whole grain or whole wheat bread. Brown rice. Whole grain or whole wheat pasta. Quinoa, bulgur, and whole grain cereals. Low-sodium cereals. Corn or whole wheat flour tortillas. Whole grain cornbread. Whole grain crackers. Low-sodium crackers.  Vegetables Fresh or frozen vegetables (raw, steamed, roasted, or grilled). Low-sodium or reduced-sodium tomato and vegetable juices. Low-sodium or reduced-sodium tomato sauce and paste. Low-sodium or reduced-sodium canned vegetables.   Fruits All fresh, canned (in natural juice), or  frozen fruits.  Protein Products  All fish and seafood.  Dried beans, peas, or lentils. Unsalted nuts and seeds. Unsalted canned beans.  Dairy Low-fat dairy products, such as skim or 1% milk, 2% or reduced-fat cheeses, low-fat ricotta or cottage cheese, or plain low-fat yogurt. Low-sodium or reduced-sodium cheeses.  Fats and Oils Tub margarines without trans fats. Light or reduced-fat mayonnaise and salad dressings (reduced sodium). Avocado. Safflower, olive, or canola oils. Natural peanut or almond butter.  Other Unsalted popcorn and pretzels. The items listed above may not be a complete list of recommended foods or beverages. Contact your dietitian for more options.  ++++++++++++++++++++  WHAT FOODS ARE NOT RECOMMENDED? Grains/ White flour or wheat flour White bread. White pasta. White rice. Refined cornbread. Bagels and croissants. Crackers that contain trans fat.  Vegetables  Creamed or fried vegetables. Vegetables in a . Regular canned vegetables. Regular canned tomato sauce and paste. Regular tomato and vegetable juices.  Fruits Dried fruits. Canned fruit in light or heavy syrup. Fruit juice.  Meat and Other Protein Products Meat in general - RED meat & White meat.  Fatty cuts of meat. Ribs, chicken wings, all processed meats as bacon, sausage, bologna, salami, fatback, hot dogs, bratwurst and packaged luncheon meats.  Dairy Whole or 2% milk, cream, half-and-half, and cream cheese. Whole-fat or sweetened yogurt. Full-fat cheeses or blue cheese. Non-dairy creamers and whipped toppings. Processed cheese, cheese spreads, or cheese curds.  Condiments Onion and garlic salt, seasoned salt, table salt, and sea salt. Canned and packaged gravies. Worcestershire sauce. Tartar sauce. Barbecue sauce. Teriyaki sauce. Soy sauce, including reduced sodium. Steak sauce. Fish sauce. Oyster sauce. Cocktail sauce. Horseradish. Ketchup and mustard. Meat flavorings and tenderizers. Bouillon cubes.  Hot sauce. Tabasco sauce. Marinades. Taco seasonings. Relishes.  Fats and Oils Butter, stick margarine, lard, shortening and bacon fat. Coconut, palm kernel, or palm oils. Regular salad dressings.  Pickles and olives. Salted popcorn and pretzels.  The items listed above may not be a complete list of foods and beverages to avoid.

## 2019-11-20 NOTE — Progress Notes (Signed)
This very nice 69 y.o. MWM presents for  evaluation and management of multiple medical co-morbidities.  Patient has been followed for HTN, HLD, T2_NIDDM  and Vitamin D Deficiency. Patient has hx/o Gout controlled on his Allopurinol.      HTN predates since 2010. Patient's BP has been controlled at home.  Today's BP is at goal-114/82. Patient denies any cardiac symptoms as chest pain, palpitations, shortness of breath, dizziness or ankle swelling.     Patient's hyperlipidemia is controlled with diet and medications. Patient denies myalgias or other medication SE's. Last lipids were at goal except slightly elevated Trig's:  Lab Results  Component Value Date   CHOL 118 08/17/2019   HDL 31 (L) 08/17/2019   LDLCALC 62 08/17/2019   TRIG 171 (H) 08/17/2019   CHOLHDL 3.8 08/17/2019       Patient is moderately obese (BMI 36.3+) & has hx/o T2_NIDDM / CKD2 (03/2016) and patient denies reactive hypoglycemic symptoms, visual blurring, diabetic polys or paresthesias. Last A1c was not at goal:  Lab Results  Component Value Date   HGBA1C 6.4 (H) 08/17/2019        Finally, patient has history of Vitamin D Deficiency ("23" / Mar 2018)  and last vitamin D was still slightly low (goal 70-100):  Lab Results  Component Value Date   VD25OH 45 08/17/2019    Current Outpatient Medications on File Prior to Visit  Medication Sig  . ACCU-CHEK SOFTCLIX LANCETS lancets Use once daily with Accu-Chek meter to check BS reading. Dx. E11.9  . allopurinol (ZYLOPRIM) 300 MG tablet Take 1 tablet daily to prevent Gout Attack  . Ascorbic Acid (VITAMIN C) 1000 MG tablet Take 1,000 mg by mouth daily.  . Blood Glucose Monitoring Suppl (ACCU-CHEK AVIVA PLUS) w/Device KIT Use daily to check BS.  Dx. E11.9  . Cholecalciferol (VITAMIN D PO) Take 10,000 Units by mouth daily.  . diphenhydramine-acetaminophen (TYLENOL PM) 25-500 MG TABS tablet Take 1 tablet by mouth at bedtime as needed.  . fenofibrate micronized  (LOFIBRA) 134 MG capsule Take 1 capsule Daily for Triglycerides (Blood Fats)  . gabapentin (NEURONTIN) 100 MG capsule Take 1-2 capsules at bedtime.  Marland Kitchen glucose blood test strip Use once daily with Accu-Chek meter to check BS. Dx. E11.9  . losartan-hydrochlorothiazide (HYZAAR) 100-25 MG tablet Take 1/2 to 1  tablet daily for BP  . metFORMIN (GLUCOPHAGE-XR) 500 MG 24 hr tablet Take 1 to 2 tablets 2 x /day with Meals for Diabetes  . Multiple Vitamin (MULTIVITAMIN) tablet Take 1 tablet by mouth daily.  . mupirocin cream (BACTROBAN) 2 % Apply to affected area 3 times daily  . OVER THE COUNTER MEDICATION OTC Flonase 2 sprays per nostril every evening  . rosuvastatin (CRESTOR) 40 MG tablet Take 1 tablet Daily for Cholesterol  . tadalafil (CIALIS) 20 MG tablet Take 1/2 to 1 tablet every 2 to 3 days as needed for XXXX  . zinc gluconate 50 MG tablet Take 50 mg by mouth daily.   No current facility-administered medications on file prior to visit.   No Known Allergies   Past Medical History:  Diagnosis Date  . Diabetes mellitus without complication ([place]) 3888  . Hyperlipidemia 2017  . Hypertension 2010   Health Maintenance  Topic Date Due  . OPHTHALMOLOGY EXAM  12/06/1961  . HEMOGLOBIN A1C  02/14/2020  . FOOT EXAM  05/14/2020  . Fecal DNA (Cologuard)  08/16/2021  . TETANUS/TDAP  10/13/2025  . INFLUENZA VACCINE  Completed  .  Hepatitis C Screening  Completed  . PNA vac Low Risk Adult  Completed   Immunization History  Administered Date(s) Administered  . Influenza, High Dose Seasonal PF 06/24/2017, 07/21/2018, 08/17/2019  . Pneumococcal Conjugate-13 04/18/2018  . Pneumococcal Polysaccharide-23 05/16/2019  . Tdap 10/14/2015    Family History  Problem Relation Age of Onset  . Hypertension Mother   . Heart disease Mother   . Diabetes Father   . Hyperlipidemia Father   . Hypertension Father     Tobacco Use  . Smoking status: Never Smoker  . Smokeless tobacco: Never Used  Substance  and Sexual Activity  . Alcohol use: Yes  . Drug use: No  . Sexual activity: Yes    ROS Constitutional: Denies fever, chills, weight loss/gain, headaches, insomnia,  night sweats or change in appetite. Does c/o fatigue. Eyes: Denies redness, blurred vision, diplopia, discharge, itchy or watery eyes.  ENT: Denies discharge, congestion, post nasal drip, epistaxis, sore throat, earache, hearing loss, dental pain, Tinnitus, Vertigo, Sinus pain or snoring.  Cardio: Denies chest pain, palpitations, irregular heartbeat, syncope, dyspnea, diaphoresis, orthopnea, PND, claudication or edema Respiratory: denies cough, dyspnea, DOE, pleurisy, hoarseness, laryngitis or wheezing.  Gastrointestinal: Denies dysphagia, heartburn, reflux, water brash, pain, cramps, nausea, vomiting, bloating, diarrhea, constipation, hematemesis, melena, hematochezia, jaundice or hemorrhoids Genitourinary: Denies dysuria, frequency, urgency, nocturia, hesitancy, discharge, hematuria or flank pain Musculoskeletal: Denies arthralgia, myalgia, stiffness, Jt. Swelling, pain, limp or strain/sprain. Denies Falls. Skin: Denies puritis, rash, hives, warts, acne, eczema or change in skin lesion Neuro: No weakness, tremor, incoordination, spasms, paresthesia or pain Psychiatric: Denies confusion, memory loss or sensory loss. Denies Depression. Endocrine: Denies change in weight, skin, hair change, nocturia, and paresthesia, diabetic polys, visual blurring or hyper / hypo glycemic episodes.  Heme/Lymph: No excessive bleeding, bruising or enlarged lymph nodes.  Physical Exam  BP 114/82   Pulse 84   Temp (!) 97.5 F (36.4 C)   Resp (!) 84   Ht '6\' 2"'$  (1.88 m)   Wt 290 lb (131.5 kg)   BMI 37.23 kg/m   General Appearance: Well nourished and well groomed and in no apparent distress.  Eyes: PERRLA, EOMs, conjunctiva no swelling or erythema, normal fundi and vessels. Sinuses: No frontal/maxillary tenderness ENT/Mouth: EACs patent / TMs   nl. Nares clear without erythema, swelling, mucoid exudates. Oral hygiene is good. No erythema, swelling, or exudate. Tongue normal, non-obstructing. Tonsils not swollen or erythematous. Hearing normal.  Neck: Supple, thyroid not palpable. No bruits, nodes or JVD. Respiratory: Respiratory effort normal.  BS equal and clear bilateral without rales, rhonci, wheezing or stridor. Cardio: Heart sounds are normal with regular rate and rhythm and no murmurs, rubs or gallops. Peripheral pulses are normal and equal bilaterally without edema. No aortic or femoral bruits. Chest: symmetric with normal excursions and percussion.  Abdomen: Soft, with Nl bowel sounds. Nontender, no guarding, rebound, hernias, masses, or organomegaly.  Lymphatics: Non tender without lymphadenopathy.  Musculoskeletal: Full ROM all peripheral extremities, joint stability, 5/5 strength, and normal gait. Skin: Warm and dry without rashes, lesions, cyanosis, clubbing or  ecchymosis.  Neuro: Cranial nerves intact, reflexes equal bilaterally. Normal muscle tone, no cerebellar symptoms. Sensation intact.  Pysch: Alert and oriented X 3 with normal affect, insight and judgment appropriate.   Assessment and Plan  1. Essential hypertension  - CBC with Differential/Platelet - COMPLETE METABOLIC PANEL WITH GFR - Magnesium - TSH  2. Hyperlipidemia associated with type 2 diabetes mellitus (HCC)  - Lipid panel - TSH  3. Type 2 diabetes mellitus with stage 2 chronic kidney disease,  without long-term current use of insulin (HCC)  - Hemoglobin A1c - Insulin, random  4. Vitamin D deficiency  - VITAMIN D 25 Hydroxy  5. Chronic gout  - Uric acid  6. Medication management  - CBC with Differential/Platelet - COMPLETE METABOLIC PANEL WITH GFR - Magnesium - Lipid panel - TSH - Hemoglobin A1c - Insulin, random - VITAMIN D 25 Hydroxy  - Uric acid      Patient was counseled in prudent diet, weight control to achieve/maintain  BMI less than 25, BP monitoring, regular exercise and medications as discussed.  Discussed med effects and SE's. Routine screening labs and tests as requested with regular follow-up as recommended. Over 25 minutes of exam, counseling, chart review and  complex critical decision making was performed   Kirtland Bouchard, MD

## 2019-11-21 ENCOUNTER — Ambulatory Visit (INDEPENDENT_AMBULATORY_CARE_PROVIDER_SITE_OTHER): Payer: Medicare HMO | Admitting: Internal Medicine

## 2019-11-21 ENCOUNTER — Other Ambulatory Visit: Payer: Self-pay

## 2019-11-21 VITALS — BP 114/82 | HR 84 | Temp 97.5°F | Resp 84 | Ht 74.0 in | Wt 290.0 lb

## 2019-11-21 DIAGNOSIS — E559 Vitamin D deficiency, unspecified: Secondary | ICD-10-CM

## 2019-11-21 DIAGNOSIS — M1A9XX0 Chronic gout, unspecified, without tophus (tophi): Secondary | ICD-10-CM | POA: Diagnosis not present

## 2019-11-21 DIAGNOSIS — Z79899 Other long term (current) drug therapy: Secondary | ICD-10-CM

## 2019-11-21 DIAGNOSIS — E785 Hyperlipidemia, unspecified: Secondary | ICD-10-CM

## 2019-11-21 DIAGNOSIS — N182 Chronic kidney disease, stage 2 (mild): Secondary | ICD-10-CM

## 2019-11-21 DIAGNOSIS — I10 Essential (primary) hypertension: Secondary | ICD-10-CM

## 2019-11-21 DIAGNOSIS — E1169 Type 2 diabetes mellitus with other specified complication: Secondary | ICD-10-CM

## 2019-11-21 DIAGNOSIS — E1122 Type 2 diabetes mellitus with diabetic chronic kidney disease: Secondary | ICD-10-CM | POA: Diagnosis not present

## 2019-11-22 LAB — COMPLETE METABOLIC PANEL WITH GFR
AG Ratio: 1.7 (calc) (ref 1.0–2.5)
ALT: 140 U/L — ABNORMAL HIGH (ref 9–46)
AST: 98 U/L — ABNORMAL HIGH (ref 10–35)
Albumin: 4.5 g/dL (ref 3.6–5.1)
Alkaline phosphatase (APISO): 49 U/L (ref 35–144)
BUN: 14 mg/dL (ref 7–25)
CO2: 27 mmol/L (ref 20–32)
Calcium: 9.7 mg/dL (ref 8.6–10.3)
Chloride: 104 mmol/L (ref 98–110)
Creat: 0.87 mg/dL (ref 0.70–1.25)
GFR, Est African American: 104 mL/min/{1.73_m2} (ref >=60)
GFR, Est Non African American: 89 mL/min/{1.73_m2} (ref >=60)
Globulin: 2.7 g/dL (calc) (ref 1.9–3.7)
Glucose, Bld: 140 mg/dL — ABNORMAL HIGH (ref 65–99)
Potassium: 4.5 mmol/L (ref 3.5–5.3)
Sodium: 139 mmol/L (ref 135–146)
Total Bilirubin: 0.5 mg/dL (ref 0.2–1.2)
Total Protein: 7.2 g/dL (ref 6.1–8.1)

## 2019-11-22 LAB — CBC WITH DIFFERENTIAL/PLATELET
Absolute Monocytes: 506 cells/uL (ref 200–950)
Basophils Absolute: 83 cells/uL (ref 0–200)
Basophils Relative: 1.5 %
Eosinophils Absolute: 61 cells/uL (ref 15–500)
Eosinophils Relative: 1.1 %
HCT: 39.4 % (ref 38.5–50.0)
Hemoglobin: 13.3 g/dL (ref 13.2–17.1)
Lymphs Abs: 1194 cells/uL (ref 850–3900)
MCH: 31.6 pg (ref 27.0–33.0)
MCHC: 33.8 g/dL (ref 32.0–36.0)
MCV: 93.6 fL (ref 80.0–100.0)
MPV: 10.7 fL (ref 7.5–12.5)
Monocytes Relative: 9.2 %
Neutro Abs: 3658 cells/uL (ref 1500–7800)
Neutrophils Relative %: 66.5 %
Platelets: 246 10*3/uL (ref 140–400)
RBC: 4.21 10*6/uL (ref 4.20–5.80)
RDW: 12.5 % (ref 11.0–15.0)
Total Lymphocyte: 21.7 %
WBC: 5.5 10*3/uL (ref 3.8–10.8)

## 2019-11-22 LAB — INSULIN, RANDOM: Insulin: 51.9 u[IU]/mL — ABNORMAL HIGH

## 2019-11-22 LAB — LIPID PANEL
Cholesterol: 143 mg/dL (ref <200)
HDL: 34 mg/dL — ABNORMAL LOW (ref >=40)
LDL Cholesterol (Calc): 72 mg/dL (calc)
Non-HDL Cholesterol (Calc): 109 mg/dL (calc) (ref <130)
Total CHOL/HDL Ratio: 4.2 (calc) (ref <5.0)
Triglycerides: 283 mg/dL — ABNORMAL HIGH (ref <150)

## 2019-11-22 LAB — HEMOGLOBIN A1C
Hgb A1c MFr Bld: 7.3 % of total Hgb — ABNORMAL HIGH (ref <5.7)
Mean Plasma Glucose: 163 (calc)
eAG (mmol/L): 9 (calc)

## 2019-11-22 LAB — TSH: TSH: 1.57 mIU/L (ref 0.40–4.50)

## 2019-11-22 LAB — URIC ACID: Uric Acid, Serum: 6.3 mg/dL (ref 4.0–8.0)

## 2019-11-22 LAB — MAGNESIUM: Magnesium: 1.9 mg/dL (ref 1.5–2.5)

## 2019-11-22 LAB — VITAMIN D 25 HYDROXY (VIT D DEFICIENCY, FRACTURES): Vit D, 25-Hydroxy: 31 ng/mL (ref 30–100)

## 2020-01-04 ENCOUNTER — Other Ambulatory Visit: Payer: Self-pay | Admitting: Internal Medicine

## 2020-01-04 DIAGNOSIS — E1122 Type 2 diabetes mellitus with diabetic chronic kidney disease: Secondary | ICD-10-CM

## 2020-01-04 MED ORDER — METFORMIN HCL ER 500 MG PO TB24: ORAL_TABLET | ORAL | 0 refills | Status: DC

## 2020-02-17 DIAGNOSIS — E119 Type 2 diabetes mellitus without complications: Secondary | ICD-10-CM | POA: Diagnosis not present

## 2020-02-17 DIAGNOSIS — H40033 Anatomical narrow angle, bilateral: Secondary | ICD-10-CM | POA: Diagnosis not present

## 2020-02-18 NOTE — Progress Notes (Signed)
MEDICARE ANNUAL WELLNESS VISIT AND FOLLOW UP Assessment:   Encounter for Medicare annual wellness exam 1 year  Type 2 diabetes mellitus with stage 1 chronic kidney disease, without long-term current use of insulin ([place]) -     metFORMIN (GLUCOPHAGE-XR) 500 MG 24 hr tablet; Take 1 tablet (500 mg total) by mouth 2 (two) times daily before a meal. -     Hemoglobin A1c Discussed general issues about diabetes pathophysiology and management., Educational material distributed., Suggested low cholesterol diet., Encouraged aerobic exercise., Discussed foot care., Reminded to get yearly retinal exam. Get eye exam  Hyperlipidemia associated with type 2 diabetes mellitus ([place]) -     Lipid panel check lipids decrease fatty foods increase activity.  Chronic gout without tophus, unspecified cause, unspecified site -     Uric acid Gout- recheck Uric acid as needed, Diet discussed, continue medications.  Elevated LFTs -     COMPLETE METABOLIC PANEL WITH GFR Check labs, avoid tylenol, alcohol, weight loss advised.   Morbid obesity - follow up 3 months for progress monitoring - increase veggies, decrease carbs - long discussion about weight loss, diet, and exercise  Vitamin D deficiency Continue supplement  Essential hypertension -     CBC with Differential/Platelet -     COMPLETE METABOLIC PANEL WITH GFR -     TSH - continue medications, DASH diet, exercise and monitor at home. Call if greater than 130/80.   Medication management -     Magnesium  CKD stage 1 due to type 2 diabetes mellitus (HCC) -     Hemoglobin A1c Increase fluids, avoid NSAIDS, monitor sugars, will monitor  Over 30 minutes of exam, counseling, chart review, and critical decision making was performed  Future Appointments  Date Time Provider [place]  05/23/2020  9:00 AM Unk Pinto, MD [facility] None     Plan:   During the course of the visit the patient was educated and counseled about  appropriate screening and preventive services including:    Pneumococcal vaccine   Influenza vaccine  Prevnar 13  Td vaccine  Screening electrocardiogram  Colorectal cancer screening  Diabetes screening  Glaucoma screening  Nutrition counseling    Subjective:  Roger Mcclain is a 69 y.o. male who presents for Medicare Annual Wellness Visit and 3 month follow up for HTN, hyperlipidemia, prediabetes, and vitamin D Def. .   He had COVID in Dec with pneumonia, no sequela. He is working part time and having some lower back pain. No radicular symptoms, will make OV if getting worse.   His blood pressure has been controlled at home, today their BP is BP: 124/72 He does workout, does house and yard work since the pandemic. He denies chest pain, shortness of breath, dizziness.  BMI is Body mass index is 37.49 kg/m., he is working on diet and exercise. Wt Readings from Last 3 Encounters:  02/19/20 292 lb (132.5 kg)  11/21/19 290 lb (131.5 kg)  10/31/19 282 lb 12.8 oz (128.3 kg)   He is on cholesterol medication and denies myalgias, mom had history of CAD unknown age, nonsmoker. Marland Kitchen His cholesterol is not at goal. The cholesterol last visit was:   Lab Results  Component Value Date   CHOL 143 11/21/2019   HDL 34 (L) 11/21/2019   LDLCALC 72 11/21/2019   TRIG 283 (H) 11/21/2019   CHOLHDL 4.2 11/21/2019   He has been working on diet and exercise for diabetes With hyperlipidemia on crestor 40 CKD stage 1 on Hyzaar  100/25 With ED on cialis  he is on metformin He states his sugars at home are 105-130.  DEE 02/18/19 [facility] and denies paresthesia of the feet, polydipsia, polyuria and visual disturbances.  Last A1C in the office was:  Lab Results  Component Value Date   HGBA1C 7.3 (H) 11/21/2019   Last GFR Lab Results  Component Value Date   GFRNONAA 89 11/21/2019    Patient is on Vitamin D supplement.   Lab Results  Component Value Date   VD25OH 31 11/21/2019      Patient is on allopurinol for gout and does not report a recent flare.  Lab Results  Component Value Date   LABURIC 6.3 11/21/2019    Medication Review: Current Outpatient Medications on File Prior to Visit  Medication Sig Dispense Refill  . ACCU-CHEK SOFTCLIX LANCETS lancets Use once daily with Accu-Chek meter to check BS reading. Dx. E11.9 100 each 12  . allopurinol (ZYLOPRIM) 300 MG tablet Take 1 tablet daily to prevent Gout Attack 90 tablet 3  . Ascorbic Acid (VITAMIN C) 1000 MG tablet Take 1,000 mg by mouth daily.    . Blood Glucose Monitoring Suppl (ACCU-CHEK AVIVA PLUS) w/Device KIT Use daily to check BS.  Dx. E11.9 1 kit 0  . Cholecalciferol (VITAMIN D PO) Take 10,000 Units by mouth daily.    . diphenhydramine-acetaminophen (TYLENOL PM) 25-500 MG TABS tablet Take 1 tablet by mouth at bedtime as needed.    . fenofibrate micronized (LOFIBRA) 134 MG capsule Take 1 capsule Daily for Triglycerides (Blood Fats) 90 capsule 3  . gabapentin (NEURONTIN) 100 MG capsule Take 1-2 capsules at bedtime. 180 capsule 1  . glucose blood test strip Use once daily with Accu-Chek meter to check BS. Dx. E11.9 100 each 12  . losartan-hydrochlorothiazide (HYZAAR) 100-25 MG tablet Take 1/2 to 1  tablet daily for BP 90 tablet 3  . metFORMIN (GLUCOPHAGE-XR) 500 MG 24 hr tablet Take  2 tablets 2 x /day with Meals for Diabetes 360 tablet 0  . Multiple Vitamin (MULTIVITAMIN) tablet Take 1 tablet by mouth daily.    . mupirocin cream (BACTROBAN) 2 % Apply to affected area 3 times daily 30 g 2  . OVER THE COUNTER MEDICATION OTC Flonase 2 sprays per nostril every evening    . rosuvastatin (CRESTOR) 40 MG tablet Take 1 tablet Daily for Cholesterol 90 tablet 3  . tadalafil (CIALIS) 20 MG tablet Take 1/2 to 1 tablet every 2 to 3 days as needed for XXXX 30 tablet 99  . zinc gluconate 50 MG tablet Take 50 mg by mouth daily.     No current facility-administered medications on file prior to visit.    Allergies: No  Known Allergies  Current Problems (verified) has Diabetes mellitus ([place]); Essential hypertension; Hyperlipidemia associated with type 2 diabetes mellitus ([place]); Vitamin D deficiency; Morbid obesity ([place]); Chronic gout without tophus; Elevated LFTs; CKD stage 1 due to type 2 diabetes mellitus ([place]); and COVID-19 on their problem list.  Screening Tests Immunization History  Administered Date(s) Administered  . Influenza, High Dose Seasonal PF 06/24/2017, 07/21/2018, 08/17/2019  . Pneumococcal Conjugate-13 04/18/2018  . Pneumococcal Polysaccharide-23 05/16/2019  . Tdap 10/14/2015   Preventative care: Last colonoscopy:  COLOGUARD- 08/2018 due 08/2021 CT head 2001 MRI lumbar 2000  Prior vaccinations: TD or Tdap: 2017  Influenza: 2018  Pneumococcal: Due 04/2018 Prevnar13: 2019 Shingles/Zostavax: N/A COVID COMPLETED April 14th  Names of Other Physician/Practitioners you currently use: 1. [facility] here for primary care 2. [facility] 02/2019 Patient Care Team: Unk Pinto, MD as PCP - General (Internal Medicine)  Surgical: He  has no past surgical history on file. Family His family history includes Diabetes in his father; Heart disease in his mother; Hyperlipidemia in his father; Hypertension in his father and mother.  Unknown age Social history  He reports that he has never smoked. He has never used smokeless tobacco. He reports current alcohol use. He reports that he does not use drugs.  MEDICARE WELLNESS OBJECTIVES: Physical activity:   Cardiac risk factors:   Depression/mood screen:   Depression screen [facility] 2/9 11/20/2019  Decreased Interest 0  Down, Depressed, Hopeless 0  PHQ - 2 Score 0    ADLs:  In your present state of health, do you have any difficulty performing the following activities: 11/20/2019 05/15/2019  Hearing? N N  Vision? N N  Difficulty concentrating or making decisions? N N  Walking or climbing stairs? N  N  Dressing or bathing? N N  Doing errands, shopping? N N  Some recent data might be hidden     Cognitive Testing  Alert? Yes  Normal Appearance?Yes  Oriented to person? Yes  Place? Yes   Time? Yes  Recall of three objects?  Yes  Can perform simple calculations? Yes  Displays appropriate judgment?Yes  Can read the correct time from a watch face?Yes  EOL planning: Does Patient Have a Medical Advance Directive?: No Would patient like information on creating a medical advance directive?: Yes (MAU/Ambulatory/Procedural Areas - Information given)   Objective:   Today's Vitals   02/19/20 0902  BP: 124/72  Pulse: 72  Temp: 97.6 F (36.4 C)  SpO2: 98%  Weight: 292 lb (132.5 kg)  Height: '6\' 2"'$  (1.88 m)  PainSc: 5   PainLoc: Back   Body mass index is 37.49 kg/m.  General appearance: alert, no distress, WD/WN, male HEENT: normocephalic, sclerae anicteric, TMs pearly, nares patent, no discharge or erythema, pharynx normal Oral cavity: MMM, no lesions Neck: supple, no lymphadenopathy, no thyromegaly, no masses Heart: RRR, normal S1, S2, no murmurs Lungs: CTA bilaterally, no wheezes, rhonchi, or rales Abdomen: +bs, soft, non tender, non distended, no masses, no hepatomegaly, no splenomegaly Musculoskeletal: nontender, no swelling, no obvious deformity Extremities: no edema, no cyanosis, no clubbing Pulses: 2+ symmetric, upper and lower extremities, normal cap refill Neurological: alert, oriented x 3, CN2-12 intact, strength normal upper extremities and lower extremities, sensation normal throughout, DTRs 2+ throughout, no cerebellar signs, gait normal Psychiatric: normal affect, behavior normal, pleasant   Medicare Attestation I have personally reviewed: The patient's medical and social history Their use of alcohol, tobacco or illicit drugs Their current medications and supplements The patient's functional ability including ADLs,fall risks, home safety risks, cognitive, and  hearing and visual impairment Diet and physical activities Evidence for depression or mood disorders  The patient's weight, height, BMI, and visual acuity have been recorded in the chart.  I have made referrals, counseling, and provided education to the patient based on review of the above and I have provided the patient with a written personalized care plan for preventive services.     Vicie Mutters, PA-C   02/19/2020

## 2020-02-19 ENCOUNTER — Other Ambulatory Visit: Payer: Self-pay

## 2020-02-19 ENCOUNTER — Encounter: Payer: Self-pay | Admitting: Physician Assistant

## 2020-02-19 ENCOUNTER — Ambulatory Visit (INDEPENDENT_AMBULATORY_CARE_PROVIDER_SITE_OTHER): Payer: Medicare HMO | Admitting: Physician Assistant

## 2020-02-19 VITALS — BP 124/72 | HR 72 | Temp 97.6°F | Ht 74.0 in | Wt 292.0 lb

## 2020-02-19 DIAGNOSIS — E1122 Type 2 diabetes mellitus with diabetic chronic kidney disease: Secondary | ICD-10-CM | POA: Diagnosis not present

## 2020-02-19 DIAGNOSIS — E1169 Type 2 diabetes mellitus with other specified complication: Secondary | ICD-10-CM | POA: Diagnosis not present

## 2020-02-19 DIAGNOSIS — E785 Hyperlipidemia, unspecified: Secondary | ICD-10-CM

## 2020-02-19 DIAGNOSIS — Z0001 Encounter for general adult medical examination with abnormal findings: Secondary | ICD-10-CM | POA: Diagnosis not present

## 2020-02-19 DIAGNOSIS — R7989 Other specified abnormal findings of blood chemistry: Secondary | ICD-10-CM

## 2020-02-19 DIAGNOSIS — Z79899 Other long term (current) drug therapy: Secondary | ICD-10-CM | POA: Diagnosis not present

## 2020-02-19 DIAGNOSIS — N181 Chronic kidney disease, stage 1: Secondary | ICD-10-CM

## 2020-02-19 DIAGNOSIS — Z0000 Encounter for general adult medical examination without abnormal findings: Secondary | ICD-10-CM

## 2020-02-19 DIAGNOSIS — M1A9XX0 Chronic gout, unspecified, without tophus (tophi): Secondary | ICD-10-CM

## 2020-02-19 DIAGNOSIS — R6889 Other general symptoms and signs: Secondary | ICD-10-CM | POA: Diagnosis not present

## 2020-02-19 DIAGNOSIS — E559 Vitamin D deficiency, unspecified: Secondary | ICD-10-CM | POA: Diagnosis not present

## 2020-02-19 DIAGNOSIS — I10 Essential (primary) hypertension: Secondary | ICD-10-CM

## 2020-02-19 NOTE — Patient Instructions (Addendum)
RANGE OF A1C   Your A1C is a measure of your sugar over the past 3 months and is not affected by what you have eaten over the past few days. Diabetes increases your chances of stroke and heart attack over 300 % and is the leading cause of blindness and kidney failure in the [place]. Please make sure you decrease bad carbs like white bread, white rice, potatoes, corn, soft drinks, pasta, cereals, refined sugars, sweet tea, dried fruits, and fruit juice. Good carbs are okay to eat in moderation like sweet potatoes, brown rice, whole grain pasta/bread, most fruit (except dried fruit) and you can eat as many veggies as you want.   Greater than 6.5 is considered diabetic. Between 6.4 and 5.7 is prediabetic If your A1C is less than 5.7 you are NOT diabetic.  Targets for Glucose Readings: Time of Check Target for patients WITHOUT Diabetes Target for DIABETICS  Before Meals Less than 100  less than 150  Two hours after meals Less than 200  Less than 250    Diabetes is a very complicated disease...lets simplify it.  An easy way to look at it to understand the complications is if you think of the extra sugar floating in your blood stream as glass shards floating through your blood stream.    Diabetes affects your small vessels first: 1) The glass shards (sugar) scraps down the tiny blood vessels in your eyes and lead to diabetic retinopathy, the leading cause of blindness in the [place]. Diabetes is the leading cause of newly diagnosed adult (40 to 68 years of age) blindness in the [place].  2) The glass shards scratches down the tiny vessels of your legs leading to nerve damage called neuropathy and can lead to amputations of your feet. More than 60% of all non-traumatic amputations of lower limbs occur in people with diabetes.  3) Over time the small vessels in your brain are shredded and closed off, individually this does not cause any problems but over a long period of time many of the  small vessels being blocked can lead to Vascular Dementia.   4) Your kidney's are a filter system and have a "net" that keeps certain things in the body and lets bad things out. Sugar shreds this net and leads to kidney damage and eventually failure. Decreasing the sugar that is destroying the net and certain blood pressure medications can help stop or decrease progression of kidney disease. Diabetes was the primary cause of kidney failure in 44 percent of all new cases in 2011.  5) Diabetes also destroys the small vessels in your penis that lead to erectile dysfunction. Eventually the vessels are so damaged that you may not be responsive to cialis or viagra.   Diabetes and your large vessels: Your larger vessels consist of your coronary arteries in your heart and the carotid vessels to your brain. Diabetes or even increased sugars put you at 300% increased risk of heart attack and stroke and this is why.. The sugar scrapes down your large blood vessels and your body sees this as an internal injury and tries to repair itself. Just like you get a scab on your skin, your platelets will stick to the blood vessel wall trying to heal it. This is why we have diabetics on low dose aspirin daily, this prevents the platelets from sticking and can prevent plaque formation. In addition, your body takes cholesterol and tries to shove it into the open wound. This is  why we want your LDL, or bad cholesterol, below 70.   The combination of platelets and cholesterol over 5-10 years forms plaque that can break off and cause a heart attack or stroke.   PLEASE REMEMBER:  Diabetes is preventable! Up to 53 percent of complications and morbidities among individuals with type 2 diabetes can be prevented, delayed, or effectively treated and minimized with regular visits to a health professional, appropriate monitoring and medication, and a healthy diet and lifestyle.  Ask insurance and pharmacy about shingrix - it is a 2  part shot that we will not be getting in the office.   Suggest getting AFTER covid vaccines, have to wait at least a month This shot can make you feel bad due to such good immune response it can trigger some inflammation so take tylenol or aleve day of or day after and plan on resting.   Can go to [email or web address] for more information  Shingrix Vaccination  Two vaccines are licensed and recommended to prevent shingles in the [place] Zoster vaccine live (ZVL, Zostavax) has been in use since 2006. Recombinant zoster vaccine (RZV, Shingrix), has been in use since 2017 and is recommended by AC[employer] as the preferred shingles vaccine.  What Everyone Should Know about Shingles Vaccine (Shingrix) One of the Recommended Vaccines by Disease Shingles vaccination is the only way to protect against shingles and postherpetic neuralgia (PHN), the most common complication from shingles. CDC recommends that healthy adults 50 years and older get two doses of the shingles vaccine called Shingrix (recombinant zoster vaccine), separated by 2 to 6 months, to prevent shingles and the complications from the disease. Your doctor or pharmacist can give you Shingrix as a shot in your upper arm. Shingrix provides strong protection against shingles and PHN. Two doses of Shingrix is more than 90% effective at preventing shingles and PHN. Protection stays above 85% for at least the first four years after you get vaccinated. Shingrix is the preferred vaccine, over Zostavax (zoster vaccine live), a shingles vaccine in use since 2006. Zostavax may still be used to prevent shingles in healthy adults 60 years and older. For example, you could use Zostavax if a person is allergic to Shingrix, prefers Zostavax, or requests immediate vaccination and Shingrix is unavailable. Who Should Get Shingrix? Healthy adults 50 years and older should get two doses of Shingrix, separated by 2 to 6  months. You should get Shingrix even if in the past you . had shingles  . received Zostavax  . are not sure if you had chickenpox There is no maximum age for getting Shingrix. If you had shingles in the past, you can get Shingrix to help prevent future occurrences of the disease. There is no specific length of time that you need to wait after having shingles before you can receive Shingrix, but generally you should make sure the shingles rash has gone away before getting vaccinated. You can get Shingrix whether or not you remember having had chickenpox in the past. Studies show that more than 99% of Americans 40 years and older have had chickenpox, even if they don't remember having the disease. Chickenpox and shingles are related because they are caused by the same virus (varicella zoster virus). After a person recovers from chickenpox, the virus stays dormant (inactive) in the body. It can reactivate years later and cause shingles. If you had Zostavax in the recent past, you should wait at least eight weeks before getting Shingrix. Talk to your healthcare provider to determine  the best time to get Shingrix. Shingrix is available in [facility] and pharmacies. To find doctor's offices or pharmacies near you that offer the vaccine, visit [facility] Vaccine FinderExternal. If you have questions about Shingrix, talk with your healthcare provider. Vaccine for Those 43 Years and Older  Shingrix reduces the risk of shingles and PHN by more than 90% in people 39 and older. CDC recommends the vaccine for healthy adults 59 and older.  Who Should Not Get Shingrix? You should not get Shingrix if you: . have ever had a severe allergic reaction to any component of the vaccine or after a dose of Shingrix  . tested negative for immunity to varicella zoster virus. If you test negative, you should get chickenpox vaccine.  . currently have shingles  . currently are pregnant or breastfeeding. Women who are  pregnant or breastfeeding should wait to get Shingrix.  Marland Kitchen receive specific antiviral drugs (acyclovir, famciclovir, or valacyclovir) 24 hours before vaccination (avoid use of these antiviral drugs for 14 days after vaccination)- zoster vaccine live only If you have a minor acute (starts suddenly) illness, such as a cold, you may get Shingrix. But if you have a moderate or severe acute illness, you should usually wait until you recover before getting the vaccine. This includes anyone with a temperature of 101.22F or higher. The side effects of the Shingrix are temporary, and usually last 2 to 3 days. While you may experience pain for a few days after getting Shingrix, the pain will be less severe than having shingles and the complications from the disease. How Well Does Shingrix Work? Two doses of Shingrix provides strong protection against shingles and postherpetic neuralgia (PHN), the most common complication of shingles. . In adults 8 to 68 years old who got two doses, Shingrix was 97% effective in preventing shingles; among adults 70 years and older, Shingrix was 91% effective.  . In adults 37 to 68 years old who got two doses, Shingrix was 91% effective in preventing PHN; among adults 70 years and older, Shingrix was 89% effective. Shingrix protection remained high (more than 85%) in people 70 years and older throughout the four years following vaccination. Since your risk of shingles and PHN increases as you get older, it is important to have strong protection against shingles in your older years. Top of Page  What Are the Possible Side Effects of Shingrix? Studies show that Shingrix is safe. The vaccine helps your body create a strong defense against shingles. As a result, you are likely to have temporary side effects from getting the shots. The side effects may affect your ability to do normal daily activities for 2 to 3 days. Most people got a sore arm with mild or moderate pain after getting  Shingrix, and some also had redness and swelling where they got the shot. Some people felt tired, had muscle pain, a headache, shivering, fever, stomach pain, or nausea. About 1 out of 6 people who got Shingrix experienced side effects that prevented them from doing regular activities. Symptoms went away on their own in about 2 to 3 days. Side effects were more common in younger people. You might have a reaction to the first or second dose of Shingrix, or both doses. If you experience side effects, you may choose to take over-the-counter pain medicine such as ibuprofen or acetaminophen. If you experience side effects from Shingrix, you should report them to the Vaccine Adverse Event Reporting System (VAERS). Your doctor might file this report, or  you can do it yourself through the VAERS websiteExternal, or by calling [phone]. If you have any questions about side effects from Shingrix, talk with your doctor. The shingles vaccine does not contain thimerosal (a preservative containing mercury). Top of Page  When Should I See a Doctor Because of the Side Effects I Experience From Shingrix? In clinical trials, Shingrix was not associated with serious adverse events. In fact, serious side effects from vaccines are extremely rare. For example, for every 1 million doses of a vaccine given, only one or two people may have a severe allergic reaction. Signs of an allergic reaction happen within minutes or hours after vaccination and include hives, swelling of the face and throat, difficulty breathing, a fast heartbeat, dizziness, or weakness. If you experience these or any other life-threatening symptoms, see a doctor right away. Shingrix causes a strong response in your immune system, so it may produce short-term side effects more intense than you are used to from other vaccines. These side effects can be uncomfortable, but they are expected and usually go away on their own in 2 or 3 days. Top of Page  How  Can I Pay For Shingrix? There are several ways shingles vaccine may be paid for: Medicare . Medicare Part D plans cover the shingles vaccine, but there may be a cost to you depending on your plan. There may be a copay for the vaccine, or you may need to pay in full then get reimbursed for a certain amount.  . Medicare Part B does not cover the shingles vaccine. Medicaid . Medicaid may or may not cover the vaccine. Contact your insurer to find out. Private health insurance . Many private health insurance plans will cover the vaccine, but there may be a cost to you depending on your plan. Contact your insurer to find out. Vaccine assistance programs . Some pharmaceutical companies provide vaccines to eligible adults who cannot afford them. You may want to check with the vaccine manufacturer, [employer], about Shingrix. If you do not currently have health insurance, learn more about affordable health coverage optionsExternal. To find doctor's offices or pharmacies near you that offer the vaccine, visit HealthMap Vaccine FinderExternal.

## 2020-02-20 LAB — LIPID PANEL
Cholesterol: 142 mg/dL (ref <200)
HDL: 29 mg/dL — ABNORMAL LOW (ref >=40)
LDL Cholesterol (Calc): 72 mg/dL (calc)
Non-HDL Cholesterol (Calc): 113 mg/dL (calc) (ref <130)
Total CHOL/HDL Ratio: 4.9 (calc) (ref <5.0)
Triglycerides: 339 mg/dL — ABNORMAL HIGH (ref <150)

## 2020-02-20 LAB — CBC WITH DIFFERENTIAL/PLATELET
Absolute Monocytes: 572 cells/uL (ref 200–950)
Basophils Absolute: 72 cells/uL (ref 0–200)
Basophils Relative: 1.1 %
Eosinophils Absolute: 52 cells/uL (ref 15–500)
Eosinophils Relative: 0.8 %
HCT: 41.3 % (ref 38.5–50.0)
Hemoglobin: 13.8 g/dL (ref 13.2–17.1)
Lymphs Abs: 1313 cells/uL (ref 850–3900)
MCH: 31.2 pg (ref 27.0–33.0)
MCHC: 33.4 g/dL (ref 32.0–36.0)
MCV: 93.4 fL (ref 80.0–100.0)
MPV: 10.6 fL (ref 7.5–12.5)
Monocytes Relative: 8.8 %
Neutro Abs: 4492 cells/uL (ref 1500–7800)
Neutrophils Relative %: 69.1 %
Platelets: 257 10*3/uL (ref 140–400)
RBC: 4.42 10*6/uL (ref 4.20–5.80)
RDW: 11.9 % (ref 11.0–15.0)
Total Lymphocyte: 20.2 %
WBC: 6.5 10*3/uL (ref 3.8–10.8)

## 2020-02-20 LAB — COMPLETE METABOLIC PANEL WITH GFR
AG Ratio: 1.7 (calc) (ref 1.0–2.5)
ALT: 102 U/L — ABNORMAL HIGH (ref 9–46)
AST: 78 U/L — ABNORMAL HIGH (ref 10–35)
Albumin: 4.4 g/dL (ref 3.6–5.1)
Alkaline phosphatase (APISO): 52 U/L (ref 35–144)
BUN: 13 mg/dL (ref 7–25)
CO2: 26 mmol/L (ref 20–32)
Calcium: 9.6 mg/dL (ref 8.6–10.3)
Chloride: 102 mmol/L (ref 98–110)
Creat: 0.9 mg/dL (ref 0.70–1.25)
GFR, Est African American: 101 mL/min/{1.73_m2} (ref >=60)
GFR, Est Non African American: 87 mL/min/{1.73_m2} (ref >=60)
Globulin: 2.6 g/dL (calc) (ref 1.9–3.7)
Glucose, Bld: 162 mg/dL — ABNORMAL HIGH (ref 65–99)
Potassium: 4.3 mmol/L (ref 3.5–5.3)
Sodium: 136 mmol/L (ref 135–146)
Total Bilirubin: 0.5 mg/dL (ref 0.2–1.2)
Total Protein: 7 g/dL (ref 6.1–8.1)

## 2020-02-20 LAB — HEMOGLOBIN A1C
Hgb A1c MFr Bld: 6.8 % of total Hgb — ABNORMAL HIGH (ref <5.7)
Mean Plasma Glucose: 148 (calc)
eAG (mmol/L): 8.2 (calc)

## 2020-02-20 LAB — MAGNESIUM: Magnesium: 2 mg/dL (ref 1.5–2.5)

## 2020-02-20 LAB — VITAMIN D 25 HYDROXY (VIT D DEFICIENCY, FRACTURES): Vit D, 25-Hydroxy: 52 ng/mL (ref 30–100)

## 2020-02-20 LAB — TSH: TSH: 2.05 mIU/L (ref 0.40–4.50)

## 2020-03-01 ENCOUNTER — Other Ambulatory Visit: Payer: Self-pay | Admitting: Internal Medicine

## 2020-03-01 DIAGNOSIS — N181 Chronic kidney disease, stage 1: Secondary | ICD-10-CM

## 2020-03-01 DIAGNOSIS — E1122 Type 2 diabetes mellitus with diabetic chronic kidney disease: Secondary | ICD-10-CM

## 2020-03-14 ENCOUNTER — Other Ambulatory Visit: Payer: Self-pay | Admitting: Internal Medicine

## 2020-03-14 DIAGNOSIS — E782 Mixed hyperlipidemia: Secondary | ICD-10-CM

## 2020-03-14 DIAGNOSIS — Z79899 Other long term (current) drug therapy: Secondary | ICD-10-CM

## 2020-03-14 MED ORDER — GABAPENTIN 100 MG PO CAPS: ORAL_CAPSULE | ORAL | 1 refills | Status: DC

## 2020-03-14 MED ORDER — ROSUVASTATIN CALCIUM 40 MG PO TABS: ORAL_TABLET | ORAL | 3 refills | Status: DC

## 2020-05-08 ENCOUNTER — Other Ambulatory Visit: Payer: Self-pay | Admitting: Internal Medicine

## 2020-05-08 DIAGNOSIS — E1122 Type 2 diabetes mellitus with diabetic chronic kidney disease: Secondary | ICD-10-CM

## 2020-05-22 ENCOUNTER — Encounter: Payer: Self-pay | Admitting: Internal Medicine

## 2020-05-22 NOTE — Patient Instructions (Signed)

## 2020-05-22 NOTE — Progress Notes (Signed)
Annual  Screening/Preventative Visit  & Comprehensive Evaluation & Examination     This very nice 69 y.o.  MWM presents for a Screening /Preventative Visit & comprehensive evaluation and management of multiple medical co-morbidities.  Patient has been followed for HTN, HLD, T2_NIDDM  and Vitamin D Deficiency. Patient has Gout controlled on his meds.     Patient had Covid PNA in Dec 2020 w/o sequelae.     HTN predates circa 2010. Patient's BP has been controlled at home.  Today's BP is at goal - 130/84. Patient denies any cardiac symptoms as chest pain, palpitations, shortness of breath, dizziness or ankle swelling.     Patient's hyperlipidemia is controlled with diet and  Rosuvastatin. Patient denies myalgias or other medication SE's. Last lipids were at goal except elevated Trig's:  Lab Results  Component Value Date   CHOL 140 05/23/2020   HDL 29 (L) 05/23/2020   LDLCALC 75 05/23/2020   TRIG 271 (H) 05/23/2020   CHOLHDL 4.8 05/23/2020       Patient has Morbid Obesity (BMI 37.5) and consequent  T2_NIDDM / CKD2 (GFR 87) and patient denies reactive hypoglycemic symptoms, visual blurring, diabetic polys or paresthesias. Last A1c was not at goal:  Lab Results  Component Value Date   HGBA1C 7.3 (H) 05/23/2020        Finally, patient has history of Vitamin D Deficiency ("23" / 2018)  and last vitamin D was still low (goal 70-100):  Lab Results  Component Value Date   VD25OH 48 05/23/2020    Current Outpatient Medications on File Prior to Visit  Medication Sig  . ACCU-CHEK SOFTCLIX LANCETS lancets Use once daily with Accu-Chek meter to check BS reading. Dx. E11.9  . allopurinol (ZYLOPRIM) 300 MG tablet Take 1 tablet daily to prevent Gout Attack  . Ascorbic Acid (VITAMIN C) 1000 MG tablet Take 1,000 mg by mouth daily.  . Blood Glucose Monitoring Suppl (ACCU-CHEK AVIVA PLUS) w/Device KIT Use daily to check BS.  Dx. E11.9  . Cholecalciferol (VITAMIN D PO) Take 10,000 Units by mouth  daily.  . diphenhydramine-acetaminophen (TYLENOL PM) 25-500 MG TABS tablet Take 1 tablet by mouth at bedtime as needed.  . fenofibrate micronized (LOFIBRA) 134 MG capsule Take 1 capsule Daily for Triglycerides (Blood Fats)  . gabapentin (NEURONTIN) 100 MG capsule Take 1-2 capsules at bedtime.  Marland Kitchen glucose blood test strip Use once daily with Accu-Chek meter to check BS. Dx. E11.9  . losartan-hydrochlorothiazide (HYZAAR) 100-25 MG tablet Take 1/2 to 1  tablet daily for BP  . metFORMIN (GLUCOPHAGE-XR) 500 MG 24 hr tablet TAKE  2 TABLETS 2 TIMES A DAY WITH MEALS FOR DIABETES  . Multiple Vitamin (MULTIVITAMIN) tablet Take 1 tablet by mouth daily.  . mupirocin cream (BACTROBAN) 2 % Apply to affected area 3 times daily  . OVER THE COUNTER MEDICATION OTC Flonase 2 sprays per nostril every evening  . rosuvastatin (CRESTOR) 40 MG tablet Take 1 tablet Daily for Cholesterol  . zinc gluconate 50 MG tablet Take 50 mg by mouth daily.   No current facility-administered medications on file prior to visit.   No Known Allergies   Past Medical History:  Diagnosis Date  . Diabetes mellitus without complication (HCC) 2017  . Hyperlipidemia 2017  . Hypertension 2010   Health Maintenance  Topic Date Due  . OPHTHALMOLOGY EXAM  Never done  . INFLUENZA VACCINE  05/05/2020  . HEMOGLOBIN A1C  11/23/2020  . FOOT EXAM  05/22/2021  . Fecal DNA (  Cologuard)  08/16/2021  . TETANUS/TDAP  10/13/2025  . COVID-19 Vaccine  Completed  . Hepatitis C Screening  Completed  . PNA vac Low Risk Adult  Completed   Immunization History  Administered Date(s) Administered  . Influenza, High Dose Seasonal PF 06/24/2017, 07/21/2018, 08/17/2019  . PFIZER SARS-COV-2 Vaccination 12/27/2019, 01/17/2020  . Pneumococcal Conjugate-13 04/18/2018  . Pneumococcal Polysaccharide-23 05/16/2019  . Tdap 10/14/2015   - Cologard - 08/23/2018 - Negative - 3 year f/u due Nov 2022  History reviewed. No pertinent surgical history.   Family  History  Problem Relation Age of Onset  . Hypertension Mother   . Heart disease Mother   . Diabetes Father   . Hyperlipidemia Father   . Hypertension Father    Social History   Socioeconomic History  . Marital status: Married    Spouse name: Dannielle Karvonen  . Number of children: Not on file  Occupational History  . Not on file  Tobacco Use  . Smoking status: Never Smoker  . Smokeless tobacco: Never Used  Substance and Sexual Activity  . Alcohol use: Yes  . Drug use: No  . Sexual activity: Yes     ROS Constitutional: Denies fever, chills, weight loss/gain, headaches, insomnia,  night sweats or change in appetite. Does c/o fatigue. Eyes: Denies redness, blurred vision, diplopia, discharge, itchy or watery eyes.  ENT: Denies discharge, congestion, post nasal drip, epistaxis, sore throat, earache, hearing loss, dental pain, Tinnitus, Vertigo, Sinus pain or snoring.  Cardio: Denies chest pain, palpitations, irregular heartbeat, syncope, dyspnea, diaphoresis, orthopnea, PND, claudication or edema Respiratory: denies cough, dyspnea, DOE, pleurisy, hoarseness, laryngitis or wheezing.  Gastrointestinal: Denies dysphagia, heartburn, reflux, water brash, pain, cramps, nausea, vomiting, bloating, diarrhea, constipation, hematemesis, melena, hematochezia, jaundice or hemorrhoids Genitourinary: Denies dysuria, frequency, urgency, nocturia, hesitancy, discharge, hematuria or flank pain Musculoskeletal: Denies arthralgia, myalgia, stiffness, Jt. Swelling, pain, limp or strain/sprain. Denies Falls. Skin: Denies puritis, rash, hives, warts, acne, eczema or change in skin lesion Neuro: No weakness, tremor, incoordination, spasms, paresthesia or pain Psychiatric: Denies confusion, memory loss or sensory loss. Denies Depression. Endocrine: Denies change in weight, skin, hair change, nocturia, and paresthesia, diabetic polys, visual blurring or hyper / hypo glycemic episodes.  Heme/Lymph: No excessive  bleeding, bruising or enlarged lymph nodes.  Physical Exam  BP 130/84   Pulse 84   Temp (!) 97.3 F (36.3 C)   Resp 16   Ht _0  (1.88 m)   Wt 279 lb 12.8 oz (126.9 kg)   BMI 35.92 kg/m   General Appearance: Over nourished  and in no apparent distress.  Eyes: PERRLA, EOMs, conjunctiva no swelling or erythema, normal fundi and vessels. Sinuses: No frontal/maxillary tenderness ENT/Mouth: EACs patent / TMs  nl. Nares clear without erythema, swelling, mucoid exudates. Oral hygiene is good. No erythema, swelling, or exudate. Tongue normal, non-obstructing. Tonsils not swollen or erythematous. Hearing normal.  Neck: Supple, thyroid not palpable. No bruits, nodes or JVD. Respiratory: Respiratory effort normal.  BS equal and clear bilateral without rales, rhonci, wheezing or stridor. Cardio: Heart sounds are normal with regular rate and rhythm and no murmurs, rubs or gallops. Peripheral pulses are normal and equal bilaterally without edema. No aortic or femoral bruits. Chest: symmetric with normal excursions and percussion.  Abdomen: Soft, with Nl bowel sounds. Nontender, no guarding, rebound, hernias, masses, or organomegaly.  Lymphatics: Non tender without lymphadenopathy.  Musculoskeletal: Full ROM all peripheral extremities, joint stability, 5/5 strength, and normal gait. Skin: Warm and dry without rashes,  lesions, cyanosis, clubbing or  ecchymosis.  Neuro: Cranial nerves intact, reflexes equal bilaterally. Normal muscle tone, no cerebellar symptoms. Sensation intact.  Pysch: Alert and oriented X 3 with normal affect, insight and judgment appropriate.   Assessment and Plan  1. Annual Preventative/Screening Exam    2. Essential hypertension  - EKG 12-Lead - [place], RETROPERITNL ABD,  LTD - Urinalysis, Routine w reflex microscopic - Microalbumin / creatinine urine ratio - CBC with Differential/Platelet - COMPLETE METABOLIC PANEL WITH GFR - Magnesium - TSH  3. Hyperlipidemia  associated with type 2 diabetes mellitus ([place])  - EKG 12-Lead - [place], RETROPERITNL ABD,  LTD - Lipid panel - TSH  4. Type 2 diabetes mellitus with stage 2 chronic kidney disease, without long-term current use of insulin (HCC)  - EKG 12-Lead - [place], RETROPERITNL ABD,  LTD - Urinalysis, Routine w reflex microscopic - Microalbumin / creatinine urine ratio - HM DIABETES FOOT EXAM - LOW EXTREMITY NEUR EXAM DOCUM - Hemoglobin A1c - Insulin, random  5. Vitamin D deficiency  - VITAMIN D 25 Hydroxy  6. Gout of multiple sites  - Uric acid  7. Morbid obesity ([place])  - TSH   8. Screening for colorectal cancer  - POC Hemoccult Bld/Stl  9. BPH with obstruction/lower urinary tract symptoms  - PSA  10. Screening for prostate cancer  - PSA  11. Screening for ischemic heart disease  - EKG 12-Lead  12. FHx: heart disease  - EKG 12-Lead - [place], RETROPERITNL ABD,  LTD  13. Screening for AAA (aortic abdominal aneurysm)  - [place], RETROPERITNL ABD,  LTD  14. Medication management  - Urinalysis, Routine w reflex microscopic - Microalbumin / creatinine urine ratio - CBC with Differential/Platelet - COMPLETE METABOLIC PANEL WITH GFR - Magnesium - Lipid panel - TSH - Hemoglobin A1c - Insulin, random - VITAMIN D 25 Hydroxy        Patient was counseled in prudent diet, weight control to achieve/maintain BMI less than 25, BP monitoring, regular exercise and medications as discussed.  Discussed med effects and SE's. Routine screening labs and tests as requested with regular follow-up as recommended. Over 40 minutes of exam, counseling, chart review and high complex critical decision making was performed   Kirtland Bouchard, MD

## 2020-05-23 ENCOUNTER — Ambulatory Visit (INDEPENDENT_AMBULATORY_CARE_PROVIDER_SITE_OTHER): Payer: Medicare HMO | Admitting: Internal Medicine

## 2020-05-23 ENCOUNTER — Other Ambulatory Visit: Payer: Self-pay

## 2020-05-23 VITALS — BP 130/84 | HR 84 | Temp 97.3°F | Resp 16 | Ht 74.0 in | Wt 279.8 lb

## 2020-05-23 DIAGNOSIS — E785 Hyperlipidemia, unspecified: Secondary | ICD-10-CM

## 2020-05-23 DIAGNOSIS — Z0000 Encounter for general adult medical examination without abnormal findings: Secondary | ICD-10-CM

## 2020-05-23 DIAGNOSIS — Z8249 Family history of ischemic heart disease and other diseases of the circulatory system: Secondary | ICD-10-CM | POA: Diagnosis not present

## 2020-05-23 DIAGNOSIS — N401 Enlarged prostate with lower urinary tract symptoms: Secondary | ICD-10-CM | POA: Diagnosis not present

## 2020-05-23 DIAGNOSIS — Z125 Encounter for screening for malignant neoplasm of prostate: Secondary | ICD-10-CM | POA: Diagnosis not present

## 2020-05-23 DIAGNOSIS — E559 Vitamin D deficiency, unspecified: Secondary | ICD-10-CM | POA: Diagnosis not present

## 2020-05-23 DIAGNOSIS — Z136 Encounter for screening for cardiovascular disorders: Secondary | ICD-10-CM | POA: Diagnosis not present

## 2020-05-23 DIAGNOSIS — Z1211 Encounter for screening for malignant neoplasm of colon: Secondary | ICD-10-CM

## 2020-05-23 DIAGNOSIS — M109 Gout, unspecified: Secondary | ICD-10-CM

## 2020-05-23 DIAGNOSIS — N182 Chronic kidney disease, stage 2 (mild): Secondary | ICD-10-CM

## 2020-05-23 DIAGNOSIS — Z79899 Other long term (current) drug therapy: Secondary | ICD-10-CM | POA: Diagnosis not present

## 2020-05-23 DIAGNOSIS — E1169 Type 2 diabetes mellitus with other specified complication: Secondary | ICD-10-CM

## 2020-05-23 DIAGNOSIS — I10 Essential (primary) hypertension: Secondary | ICD-10-CM | POA: Diagnosis not present

## 2020-05-23 DIAGNOSIS — E1122 Type 2 diabetes mellitus with diabetic chronic kidney disease: Secondary | ICD-10-CM

## 2020-05-23 DIAGNOSIS — Z0001 Encounter for general adult medical examination with abnormal findings: Secondary | ICD-10-CM

## 2020-05-23 DIAGNOSIS — N138 Other obstructive and reflux uropathy: Secondary | ICD-10-CM

## 2020-05-24 LAB — LIPID PANEL
Cholesterol: 140 mg/dL (ref <200)
HDL: 29 mg/dL — ABNORMAL LOW (ref >=40)
LDL Cholesterol (Calc): 75 mg/dL (calc)
Non-HDL Cholesterol (Calc): 111 mg/dL (calc) (ref <130)
Total CHOL/HDL Ratio: 4.8 (calc) (ref <5.0)
Triglycerides: 271 mg/dL — ABNORMAL HIGH (ref <150)

## 2020-05-24 LAB — CBC WITH DIFFERENTIAL/PLATELET
Absolute Monocytes: 583 cells/uL (ref 200–950)
Basophils Absolute: 93 cells/uL (ref 0–200)
Basophils Relative: 1.5 %
Eosinophils Absolute: 50 cells/uL (ref 15–500)
Eosinophils Relative: 0.8 %
HCT: 42.8 % (ref 38.5–50.0)
Hemoglobin: 14.3 g/dL (ref 13.2–17.1)
Lymphs Abs: 1221 cells/uL (ref 850–3900)
MCH: 31.9 pg (ref 27.0–33.0)
MCHC: 33.4 g/dL (ref 32.0–36.0)
MCV: 95.5 fL (ref 80.0–100.0)
MPV: 10.9 fL (ref 7.5–12.5)
Monocytes Relative: 9.4 %
Neutro Abs: 4253 cells/uL (ref 1500–7800)
Neutrophils Relative %: 68.6 %
Platelets: 262 10*3/uL (ref 140–400)
RBC: 4.48 10*6/uL (ref 4.20–5.80)
RDW: 12.3 % (ref 11.0–15.0)
Total Lymphocyte: 19.7 %
WBC: 6.2 10*3/uL (ref 3.8–10.8)

## 2020-05-24 LAB — VITAMIN D 25 HYDROXY (VIT D DEFICIENCY, FRACTURES): Vit D, 25-Hydroxy: 48 ng/mL (ref 30–100)

## 2020-05-24 LAB — COMPLETE METABOLIC PANEL WITH GFR
AG Ratio: 1.7 (calc) (ref 1.0–2.5)
ALT: 119 U/L — ABNORMAL HIGH (ref 9–46)
AST: 98 U/L — ABNORMAL HIGH (ref 10–35)
Albumin: 4.4 g/dL (ref 3.6–5.1)
Alkaline phosphatase (APISO): 55 U/L (ref 35–144)
BUN: 13 mg/dL (ref 7–25)
CO2: 25 mmol/L (ref 20–32)
Calcium: 9.8 mg/dL (ref 8.6–10.3)
Chloride: 101 mmol/L (ref 98–110)
Creat: 0.93 mg/dL (ref 0.70–1.25)
GFR, Est African American: 97 mL/min/{1.73_m2} (ref >=60)
GFR, Est Non African American: 84 mL/min/{1.73_m2} (ref >=60)
Globulin: 2.6 g/dL (calc) (ref 1.9–3.7)
Glucose, Bld: 234 mg/dL — ABNORMAL HIGH (ref 65–99)
Potassium: 4.4 mmol/L (ref 3.5–5.3)
Sodium: 137 mmol/L (ref 135–146)
Total Bilirubin: 0.6 mg/dL (ref 0.2–1.2)
Total Protein: 7 g/dL (ref 6.1–8.1)

## 2020-05-24 LAB — URINALYSIS, ROUTINE W REFLEX MICROSCOPIC
Bilirubin Urine: NEGATIVE
Glucose, UA: NEGATIVE
Hgb urine dipstick: NEGATIVE
Ketones, ur: NEGATIVE
Leukocytes,Ua: NEGATIVE
Nitrite: NEGATIVE
Protein, ur: NEGATIVE
Specific Gravity, Urine: 1.017 (ref 1.001–1.03)
pH: 6 (ref 5.0–8.0)

## 2020-05-24 LAB — INSULIN, RANDOM: Insulin: 64.5 u[IU]/mL — ABNORMAL HIGH

## 2020-05-24 LAB — HEMOGLOBIN A1C
Hgb A1c MFr Bld: 7.3 % of total Hgb — ABNORMAL HIGH (ref <5.7)
Mean Plasma Glucose: 163 (calc)
eAG (mmol/L): 9 (calc)

## 2020-05-24 LAB — MAGNESIUM: Magnesium: 2 mg/dL (ref 1.5–2.5)

## 2020-05-24 LAB — MICROALBUMIN / CREATININE URINE RATIO
Creatinine, Urine: 71 mg/dL (ref 20–320)
Microalb Creat Ratio: 3 mcg/mg creat (ref <30)
Microalb, Ur: 0.2 mg/dL

## 2020-05-24 LAB — PSA: PSA: 1.4 ng/mL (ref <=4.0)

## 2020-05-24 LAB — URIC ACID: Uric Acid, Serum: 6.3 mg/dL (ref 4.0–8.0)

## 2020-05-24 LAB — TSH: TSH: 1.98 mIU/L (ref 0.40–4.50)

## 2020-05-25 ENCOUNTER — Other Ambulatory Visit: Payer: Self-pay | Admitting: Internal Medicine

## 2020-05-25 NOTE — Progress Notes (Signed)
=====================================================   - PSA - Very Low -  Great  ! =====================================================   - Uric Acid / gout test - Normal & OK - Please continue Allopurinol =====================================================   - Elevated Glucose = 234 mg% and A1c = 7.3% - Very high  (Ideal or Goal A1c is less than 5.7%)  - Need too work harder on diet & weight loss or will need to start more  Diabetic meds  - possibly Insulin   ======================================================  Being diabetic has a  300% increased risk for heart attack,  stroke, cancer, and alzheimer- type vascular dementia.  =====================================================   -  It is very important that you work harder with diet by  avoiding all foods that are white except chicken,   fish & calliflower.  - Avoid white rice  (brown & wild rice is OK),   - Avoid white potatoes  (sweet potatoes in moderation is OK),   White bread or wheat bread or anything made out of   white flour like bagels, donuts, rolls, buns, biscuits, cakes,  - pastries, cookies, pizza crust, and pasta (made from  white flour & egg whites)   - vegetarian pasta or spinach or wheat pasta is OK.  - Multigrain breads like Arnold's, Pepperidge Farm or   multigrain sandwich thins or high fiber breads like   [place] bread or "Dave's Killer" breads that are  4 to 5 grams fiber per slice !  are best.   =====================================================    - Diet, exercise and weight loss is very important in the   control and prevention of complications of diabetes which  affects every system in your body, ie.   -Brain - dementia/stroke,  - eyes - glaucoma/blindness,  - heart - heart attack/heart failure,  - kidneys - dialysis,  - stomach - gastric paralysis,  - intestines - malabsorption,  - nerves - severe painful neuritis,  - circulation - gangrene & loss of a leg(s)  -  and finally  . . . . . . . . . . . . . . . . . .    - cancer and Alzheimers. =====================================================   - Total Chol = 140 and LDL Chol = 75 - Both  Excellent   - Very low risk for Heart Attack  / Stroke =============================================================  - But Triglycerides (   271   ) or fats in blood are too high  (goal is less than 150)    - Recommend avoid fried & greasy foods,  sweets / candy,   - Avoid white rice  (brown or wild rice or Quinoa is OK),   - Avoid white potatoes  (sweet potatoes are OK)   - Avoid anything made from white flour  - bagels, doughnuts, rolls, buns, biscuits, white and   wheat breads, pizza crust and traditional  pasta made of white flour & egg white  - (vegetarian pasta or spinach or wheat pasta is OK).    - Multi-grain bread is OK - like multi-grain flat bread or  sandwich thins.   - Avoid alcohol in excess.   - Exercise is also important. =====================================================   - Vitamin D = 48 - Low   - Vitamin D goal is between 70-100.   - Please make sure that you are taking your Vitamin D  ( 10,000 units /day) as directed. (if you are on 10,000 u /day - then  increase to 15,000 units /daily  - It is very important as a natural  anti-inflammatory and helping the  immune system protect against viral infections, like the Covid-19    helping hair, skin, and nails, as well as reducing stroke and  heart attack risk.   - It helps your bones and helps with mood.  - It also decreases numerous cancer risks so please take  it as directed.   - Low Vit D is associated with a 200-300% higher risk for CANCER   and 200-300% higher risk for HEART   ATTACK  &  STROKE.    - It is also associated with higher death rate at younger ages,   autoimmune diseases like Rheumatoid arthritis, Lupus,  Multiple Sclerosis.     - Also many other serious conditions, like depression,  Alzheimer's  Dementia, infertility, muscle aches, fatigue, fibromyalgia  - just to name a few ==========================================================  All Else - CBC - Kidneys - Electrolytes - Liver  - Magnesium & Thyroid    - all  Normal / OK ==========================================================

## 2020-06-27 ENCOUNTER — Encounter: Payer: Medicare HMO | Admitting: Internal Medicine

## 2020-07-05 ENCOUNTER — Other Ambulatory Visit: Payer: Self-pay

## 2020-07-05 ENCOUNTER — Encounter: Payer: Self-pay | Admitting: Emergency Medicine

## 2020-07-05 ENCOUNTER — Ambulatory Visit (INDEPENDENT_AMBULATORY_CARE_PROVIDER_SITE_OTHER): Payer: Medicare HMO

## 2020-07-05 ENCOUNTER — Ambulatory Visit
Admission: EM | Admit: 2020-07-05 | Discharge: 2020-07-05 | Disposition: A | Discharge status: Home / Self Care | Payer: Medicare HMO | Attending: Emergency Medicine | Admitting: Emergency Medicine

## 2020-07-05 DIAGNOSIS — S9031XA Contusion of right foot, initial encounter: Secondary | ICD-10-CM

## 2020-07-05 DIAGNOSIS — M7731 Calcaneal spur, right foot: Secondary | ICD-10-CM | POA: Diagnosis not present

## 2020-07-05 DIAGNOSIS — M7989 Other specified soft tissue disorders: Secondary | ICD-10-CM | POA: Diagnosis not present

## 2020-07-05 MED ORDER — NAPROXEN 500 MG PO TABS: 500.0000 mg | ORAL_TABLET | Freq: Two times a day (BID) | ORAL | 0 refills | Status: DC

## 2020-07-05 NOTE — Discharge Instructions (Signed)
Xray normal Naprosyn twice daily for pain and swelling Ice and elevate ACE wrap for compression Return if not improving or worsening

## 2020-07-05 NOTE — ED Provider Notes (Signed)
[facility]    CSN: [id number] Arrival date & time: 07/05/20  5093      History   Chief Complaint Chief Complaint  Patient presents with  . Foot Pain    HPI Roger Mcclain is a 69 y.o. male history of hypertension, hyperlipidemia, DM type II presenting today for evaluation of right foot pain. Patient's right foot was injured yesterday by heavy machinery. Since has had swelling and pain with ambulating. Denies prior fractures or injuries. Has a burning sensation in his foot.  HPI  Past Medical History:  Diagnosis Date  . Diabetes mellitus without complication ([place]) 2671  . Hyperlipidemia 2017  . Hypertension 2010    Patient Active Problem List   Diagnosis Date Noted  . COVID-19 10/31/2019  . CKD stage 1 due to type 2 diabetes mellitus ([place]) 08/16/2019  . Elevated LFTs 07/22/2018  . Gout of multiple sites 07/20/2018  . Morbid obesity ([place]) 10/01/2017  . Hyperlipidemia associated with type 2 diabetes mellitus ([place]) 12/27/2016  . Vitamin D deficiency 12/27/2016  . Diabetes mellitus ([place]) 12/24/2016  . Essential hypertension 12/24/2016    History reviewed. No pertinent surgical history.     Home Medications    Prior to Admission medications   Medication Sig Start Date End Date Taking? Authorizing Provider  ACCU-CHEK SOFTCLIX LANCETS lancets Use once daily with Accu-Chek meter to check BS reading. Dx. E11.9 04/18/18   Unk Pinto, MD  allopurinol (ZYLOPRIM) 300 MG tablet Take 1 tablet daily to prevent Gout Attack 04/19/18   Unk Pinto, MD  Ascorbic Acid (VITAMIN C) 1000 MG tablet Take 1,000 mg by mouth daily.    [provider]  Blood Glucose Monitoring Suppl (ACCU-CHEK AVIVA PLUS) w/Device KIT Use daily to check BS.  Dx. E11.9 04/18/18   Unk Pinto, MD  Cholecalciferol (VITAMIN D PO) Take 10,000 Units by mouth daily.    [provider]  diphenhydramine-acetaminophen (TYLENOL PM) 25-500 MG TABS tablet Take 1 tablet by  mouth at bedtime as needed.    [provider]  fenofibrate micronized (LOFIBRA) 134 MG capsule Take 1 capsule Daily for Triglycerides (Blood Fats) 10/31/19   Unk Pinto, MD  gabapentin (NEURONTIN) 100 MG capsule Take 1-2 capsules at bedtime. 03/14/20   Unk Pinto, MD  glucose blood test strip Use once daily with Accu-Chek meter to check BS. Dx. E11.9 02/22/19   Liane Comber, NP  losartan-hydrochlorothiazide Konrad Penta) 100-25 MG tablet Take 1/2 to 1  tablet daily for BP 10/31/19   Unk Pinto, MD  metFORMIN (GLUCOPHAGE-XR) 500 MG 24 hr tablet TAKE  2 TABLETS 2 TIMES A DAY WITH MEALS FOR DIABETES 05/08/20   Unk Pinto, MD  Multiple Vitamin (MULTIVITAMIN) tablet Take 1 tablet by mouth daily.    [provider]  mupirocin cream (BACTROBAN) 2 % Apply to affected area 3 times daily 10/31/19 10/30/20  Unk Pinto, MD  naproxen (NAPROSYN) 500 MG tablet Take 1 tablet (500 mg total) by mouth 2 (two) times daily. 07/05/20   Gavriel Holzhauer C, PA-C  OVER THE COUNTER MEDICATION OTC Flonase 2 sprays per nostril every evening    [provider]  rosuvastatin (CRESTOR) 40 MG tablet Take 1 tablet Daily for Cholesterol 03/14/20   Unk Pinto, MD  tadalafil (CIALIS) 20 MG tablet Take 1/2 to 1 tablet      every 2 to 3 days      as needed for XXXX 05/25/20   Unk Pinto, MD  zinc gluconate 50 MG tablet Take 50  mg by mouth daily.    [provider]    Family History Family History  Problem Relation Age of Onset  . Hypertension Mother   . Heart disease Mother   . Diabetes Father   . Hyperlipidemia Father   . Hypertension Father     Social History Social History   Tobacco Use  . Smoking status: Never Smoker  . Smokeless tobacco: Never Used  Substance Use Topics  . Alcohol use: Yes  . Drug use: No     Allergies   Patient has no known allergies.   Review of Systems Review of Systems  Constitutional: Negative for fatigue and fever.    Eyes: Negative for redness, itching and visual disturbance.  Respiratory: Negative for shortness of breath.   Cardiovascular: Negative for chest pain and leg swelling.  Gastrointestinal: Negative for nausea and vomiting.  Musculoskeletal: Positive for arthralgias, gait problem and joint swelling. Negative for myalgias.  Skin: Negative for color change, rash and wound.  Neurological: Negative for dizziness, syncope, weakness, light-headedness and headaches.     Physical Exam Triage Vital Signs ED Triage Vitals [07/05/20 0838]  Enc Vitals Group     BP 136/76     Pulse Rate 71     Resp 18     Temp 97.9 F (36.6 C)     Temp Source Oral     SpO2 98 %     Weight      Height      Head Circumference      Peak Flow      Pain Score 7     Pain Loc      Pain Edu?      Excl. in [place]?    No data found.  Updated Vital Signs BP 136/76 (BP Location: Right Arm)   Pulse 71   Temp 97.9 F (36.6 C) (Oral)   Resp 18   SpO2 98%   Visual Acuity Right Eye Distance:   Left Eye Distance:   Bilateral Distance:    Right Eye Near:   Left Eye Near:    Bilateral Near:     Physical Exam Vitals and nursing note reviewed.  Constitutional:      Appearance: He is well-developed.     Comments: No acute distress  HENT:     Head: Normocephalic and atraumatic.     Nose: Nose normal.  Eyes:     Conjunctiva/sclera: Conjunctivae normal.  Cardiovascular:     Rate and Rhythm: Normal rate.  Pulmonary:     Effort: Pulmonary effort is normal. No respiratory distress.  Abdominal:     General: There is no distension.  Musculoskeletal:        General: Normal range of motion.     Cervical back: Neck supple.     Comments: Right foot with moderate swelling bruising noted throughout dorsum of foot, most prominent to lateral aspect, slightly extends towards lateral malleolus, nontender to medial malleolus Dorsalis pedis 2+  Skin:    General: Skin is warm and dry.  Neurological:     Mental Status: He  is alert and oriented to person, place, and time.      UC Treatments / Results  Labs (all labs ordered are listed, but only abnormal results are displayed) Labs Reviewed - No data to display  EKG   Radiology DG Foot Complete Right  Result Date: 07/05/2020 CLINICAL DATA:  Pain and swelling EXAM: RIGHT FOOT COMPLETE - 3+ VIEW COMPARISON:  None. FINDINGS: Frontal, oblique,  and lateral views were obtained. There is no appreciable fracture or dislocation. There is spurring in the dorsal midfoot. No appreciable joint space narrowing. There is a posterior calcaneal spur. No erosion. IMPRESSION: Spurring dorsal midfoot. No appreciable joint space narrowing. Small posterior calcaneal spur. No fracture or dislocation. Electronically Signed   By: Lowella Grip III M.D.   On: 07/05/2020 09:09    Procedures Procedures (including critical care time)  Medications Ordered in UC Medications - No data to display  Initial Impression / Assessment and Plan / UC Course  I have reviewed the triage vital signs and the nursing notes.  Pertinent labs & imaging results that were available during my care of the patient were reviewed by me and considered in my medical decision making (see chart for details).     X-ray negative for fracture.  Treating as sprain/contusion and recommending rest ice elevation anti-inflammatories and providing Ace wrap to further help with swelling and compression.  Discussed strict return precautions. Patient verbalized understanding and is agreeable with plan.  Final Clinical Impressions(s) / UC Diagnoses   Final diagnoses:  Contusion of right foot, initial encounter     Discharge Instructions     Xray normal Naprosyn twice daily for pain and swelling Ice and elevate ACE wrap for compression Return if not improving or worsening    ED Prescriptions    Medication Sig Dispense Auth. Provider   naproxen (NAPROSYN) 500 MG tablet Take 1 tablet (500 mg total) by  mouth 2 (two) times daily. 30 tablet Quincee Gittens, [place] C, PA-C     PDMP not reviewed this encounter.   Joneen Caraway [place] C, PA-C 07/05/20 [phone]

## 2020-07-05 NOTE — ED Triage Notes (Addendum)
Pt sts right foot pain and swelling after foot was run over yesterday by a skid loader; CMS intact

## 2020-08-08 ENCOUNTER — Other Ambulatory Visit: Payer: Self-pay | Admitting: *Deleted

## 2020-08-08 DIAGNOSIS — I10 Essential (primary) hypertension: Secondary | ICD-10-CM

## 2020-08-08 DIAGNOSIS — E782 Mixed hyperlipidemia: Secondary | ICD-10-CM

## 2020-08-08 MED ORDER — ALLOPURINOL 300 MG PO TABS: ORAL_TABLET | ORAL | 3 refills | Status: DC

## 2020-08-08 MED ORDER — LOSARTAN POTASSIUM-HCTZ 100-25 MG PO TABS: ORAL_TABLET | ORAL | 3 refills | Status: DC

## 2020-08-08 MED ORDER — FENOFIBRATE 134 MG PO CAPS: ORAL_CAPSULE | ORAL | 3 refills | Status: DC

## 2020-08-08 MED ORDER — ROSUVASTATIN CALCIUM 40 MG PO TABS: ORAL_TABLET | ORAL | 3 refills | Status: DC

## 2020-08-22 NOTE — Progress Notes (Signed)
FOLLOW UP  Assessment and Plan:   Hypertension Well controlled with current medications  Monitor blood pressure at home; patient to call if consistently greater than 130/80 Continue DASH diet.   Reminder to go to the ER if any CP, SOB, nausea, dizziness, severe HA, changes vision/speech, left arm numbness and tingling and jaw pain.  Cholesterol Currently very near goal - LDL <70 ; continue rosuvastatin, fenofibrate; diet discussed Continue low cholesterol diet and exercise.  Check lipid panel.   Diabetes with diabetic chronic kidney disease Continue medication: metformin Continue diet and exercise.  Perform daily foot/skin check, notify office of any concerning changes.  Check A1C  CKD 1 associated with T2DM Increase fluids, avoid NSAIDS, monitor sugars, will monitor  Morbid obesity - BMI 36 with co morbidities Long discussion about weight loss, diet, and exercise Recommended diet heavy in fruits and veggies and low in animal meats, cheeses, and dairy products, appropriate calorie intake Discussed ideal weight for height  Patient will work on portions of potatoes, rice, beer Will follow up in 3 months  Vitamin D Def Below goal at last visit; he has changed dose continue supplementation to maintain goal of 70-100 Check Vit D level  Gout Continue allopurinol Diet discussed Check uric acid as needed  Elevated LTFs Trending up, elevated GGT in 07/2018 and was recommended hepatitis panel and [place] but never got [place] - discussed and reordered today  Recheck CMP for LFTs avoid tylenol, alcohol, weight loss advised.   Need for influenza vaccine -high dose quadrivalent administered without complication  Continue diet and meds as discussed. Further disposition pending results of labs. Discussed med's effects and SE's.   Over 30 minutes of exam, counseling, chart review, and critical decision making was performed.   Future Appointments  Date Time Provider [place]   11/28/2020  2:30 PM Unk Pinto, MD [facility] None  02/20/2021  9:00 AM Garnet Sierras, NP [facility] None  06/04/2021 10:00 AM Unk Pinto, MD [facility] None    ----------------------------------------------------------------------------------------------------------------------  HPI 69 y.o. male  presents for 3 month follow up on hypertension, cholesterol, diabetes, obesity and vitamin D deficiency.   BMI is Body mass index is 35.15 kg/m., he has been working on diet and exercise, eats mainly vegetables, manages 6-8 properties/yardwork. Reports he has essentially cut bread and starches. Drinks 5-6 bottles of water daily. He feels he can lose weight by lifestyle only, his goal is <262 lb Wt Readings from Last 3 Encounters:  08/26/20 273 lb 12.8 oz (124.2 kg)  05/23/20 279 lb 12.8 oz (126.9 kg)  02/19/20 292 lb (132.5 kg)   His blood pressure has been controlled at home, today their BP is BP: 130/78  He does not workout but works intense job. He denies chest pain, shortness of breath, dizziness.   He is on cholesterol medication Rosuvastatin 40 mg daily and fenofibrate 134 mg daily and denies myalgias. His cholesterol is not at goal. The cholesterol last visit was:   Lab Results  Component Value Date   CHOL 140 05/23/2020   HDL 29 (L) 05/23/2020   LDLCALC 75 05/23/2020   TRIG 271 (H) 05/23/2020   CHOLHDL 4.8 05/23/2020    He has been working on diet and exercise for T2DM well controlled by lifestyle and metformin 1000 mg daily, and denies foot ulcerations, increased appetite, nausea, paresthesia of the feet, polydipsia, polyuria and visual disturbances. He does check fasting sugars typically runs 100-127.  Last A1C in the office was:  Lab Results  Component  Value Date   HGBA1C 7.3 (H) 05/23/2020   Patient is on Vitamin D supplement and increased dose after last visit, now taking 5000 x 2 tab:  Lab Results  Component Value Date   VD25OH 48 05/23/2020     Patient is  on allopurinol for gout (reports he takes intermittently) and does not report a recent flare.  Lab Results  Component Value Date   LABURIC 6.3 05/23/2020   He has had LFT elevations trending up; GGT on 07/21/2018 was 93; iron and saturation were low with elevated ferritin; he had negative hep C 08/2019. US abdomen was ordered but never scheduled, agreeable to pursuing this per discussion today. He has fib 4 score of 2.33, approximate [place] of 2-3.  Lab Results  Component Value Date   ALT 119 (H) 05/23/2020   AST 98 (H) 05/23/2020   ALKPHOS 49 12/24/2016   BILITOT 0.6 05/23/2020     Current Medications:  Current Outpatient Medications on File Prior to Visit  Medication Sig   ACCU-CHEK SOFTCLIX LANCETS lancets Use once daily with Accu-Chek meter to check BS reading. Dx. E11.9   allopurinol (ZYLOPRIM) 300 MG tablet Take 1 tablet daily to prevent Gout Attack   Ascorbic Acid (VITAMIN C) 1000 MG tablet Take 1,000 mg by mouth daily.   Blood Glucose Monitoring Suppl (ACCU-CHEK AVIVA PLUS) w/Device KIT Use daily to check BS.  Dx. E11.9   Cholecalciferol (VITAMIN D PO) Take 10,000 Units by mouth daily.   diphenhydramine-acetaminophen (TYLENOL PM) 25-500 MG TABS tablet Take 1 tablet by mouth at bedtime as needed.   fenofibrate micronized (LOFIBRA) 134 MG capsule Take 1 capsule Daily for Triglycerides (Blood Fats)   gabapentin (NEURONTIN) 100 MG capsule Take 1-2 capsules at bedtime.   glucose blood test strip Use once daily with Accu-Chek meter to check BS. Dx. E11.9   losartan-hydrochlorothiazide (HYZAAR) 100-25 MG tablet Take 1/2 to 1  tablet daily for BP   metFORMIN (GLUCOPHAGE-XR) 500 MG 24 hr tablet TAKE  2 TABLETS 2 TIMES A DAY WITH MEALS FOR DIABETES   Multiple Vitamin (MULTIVITAMIN) tablet Take 1 tablet by mouth daily.   mupirocin cream (BACTROBAN) 2 % Apply to affected area 3 times daily   naproxen (NAPROSYN) 500 MG tablet Take 1 tablet (500 mg total) by mouth 2 (two) times  daily.   OVER THE COUNTER MEDICATION OTC Flonase 2 sprays per nostril every evening   rosuvastatin (CRESTOR) 40 MG tablet Take 1 tablet Daily for Cholesterol   tadalafil (CIALIS) 20 MG tablet Take 1/2 to 1 tablet      every 2 to 3 days      as needed for XXXX   zinc gluconate 50 MG tablet Take 50 mg by mouth daily.   No current facility-administered medications on file prior to visit.     Allergies: No Known Allergies   Medical History:  Past Medical History:  Diagnosis Date   Diabetes mellitus without complication ([place]) 2836   Hyperlipidemia 2017   Hypertension 2010   Family history- Reviewed and unchanged Social history- Reviewed and unchanged   Review of Systems:  Review of Systems  Constitutional: Negative for malaise/fatigue and weight loss.  HENT: Negative for hearing loss and tinnitus.   Eyes: Negative for blurred vision and double vision.  Respiratory: Negative for cough, shortness of breath and wheezing.   Cardiovascular: Negative for chest pain, palpitations, orthopnea, claudication and leg swelling.  Gastrointestinal: Negative for abdominal pain, blood in stool, constipation, diarrhea, heartburn, melena, nausea  and vomiting.  Genitourinary: Negative.   Musculoskeletal: Negative for joint pain and myalgias.  Skin: Negative for rash.  Neurological: Negative for dizziness, tingling, sensory change, weakness and headaches.  Endo/Heme/Allergies: Negative for polydipsia.  Psychiatric/Behavioral: Negative.   All other systems reviewed and are negative.     Physical Exam: BP 130/78    Pulse 71    Temp (!) 96.1 F (35.6 C)    Wt 273 lb 12.8 oz (124.2 kg)    SpO2 99%    BMI 35.15 kg/m  Wt Readings from Last 3 Encounters:  08/26/20 273 lb 12.8 oz (124.2 kg)  05/23/20 279 lb 12.8 oz (126.9 kg)  02/19/20 292 lb (132.5 kg)   General Appearance: Well nourished, in no apparent distress. Eyes: PERRLA, EOMs, conjunctiva no swelling or erythema Sinuses: No  Frontal/maxillary tenderness ENT/Mouth: Ext aud canals clear, TMs without erythema, bulging. No erythema, swelling, or exudate on post pharynx.  Tonsils not swollen or erythematous. Hearing normal.  Neck: Supple, thyroid normal.  Respiratory: Respiratory effort normal, BS equal bilaterally without rales, rhonchi, wheezing or stridor.  Cardio: RRR with no MRGs. Brisk peripheral pulses without edema.  Abdomen: Soft, + BS.  Non tender, no guarding, rebound, hernias, masses. Lymphatics: Non tender without lymphadenopathy.  Musculoskeletal: Full ROM, 5/5 strength, Normal gait Skin: Warm, dry without rashes, lesions, ecchymosis.  Neuro: Cranial nerves intact. No cerebellar symptoms.  Psych: Awake and oriented X 3, normal affect, Insight and Judgment appropriate.    Izora Ribas, NP 8:49 AM [facility]

## 2020-08-26 ENCOUNTER — Other Ambulatory Visit: Payer: Self-pay

## 2020-08-26 ENCOUNTER — Encounter: Payer: Self-pay | Admitting: Adult Health

## 2020-08-26 ENCOUNTER — Ambulatory Visit (INDEPENDENT_AMBULATORY_CARE_PROVIDER_SITE_OTHER): Payer: Medicare HMO | Admitting: Adult Health

## 2020-08-26 VITALS — BP 130/78 | HR 71 | Temp 96.1°F | Wt 273.8 lb

## 2020-08-26 DIAGNOSIS — N181 Chronic kidney disease, stage 1: Secondary | ICD-10-CM | POA: Diagnosis not present

## 2020-08-26 DIAGNOSIS — I10 Essential (primary) hypertension: Secondary | ICD-10-CM

## 2020-08-26 DIAGNOSIS — R7989 Other specified abnormal findings of blood chemistry: Secondary | ICD-10-CM | POA: Diagnosis not present

## 2020-08-26 DIAGNOSIS — Z23 Encounter for immunization: Secondary | ICD-10-CM

## 2020-08-26 DIAGNOSIS — E1122 Type 2 diabetes mellitus with diabetic chronic kidney disease: Secondary | ICD-10-CM

## 2020-08-26 DIAGNOSIS — E785 Hyperlipidemia, unspecified: Secondary | ICD-10-CM

## 2020-08-26 DIAGNOSIS — E559 Vitamin D deficiency, unspecified: Secondary | ICD-10-CM

## 2020-08-26 DIAGNOSIS — M1A09X0 Idiopathic chronic gout, multiple sites, without tophus (tophi): Secondary | ICD-10-CM

## 2020-08-26 DIAGNOSIS — E1169 Type 2 diabetes mellitus with other specified complication: Secondary | ICD-10-CM

## 2020-08-26 MED ORDER — TADALAFIL 20 MG PO TABS: ORAL_TABLET | ORAL | 0 refills | Status: DC

## 2020-08-26 NOTE — Addendum Note (Signed)
Addended by: Chancy Hurter on: 08/26/2020 09:33 AM   Modules accepted: Orders

## 2020-08-26 NOTE — Patient Instructions (Signed)
Goals    . HEMOGLOBIN A1C < 5.7    . Weight (lb) < 262 lb (118.8 kg)       Recommend high fiber diet for diabetes, cholesterol, weight loss     YOU CAN CALL TO MAKE AN ULTRASOUND.Marland Kitchen please go ahead and call to schedule for after the new year so you don't forget  I have put in an order for an ultrasound for you to have You can set them up at your convenience by calling this number [phone] You will likely have the ultrasound at [place] [address]  If you have any issues call our office and we will set this up for you.

## 2020-08-27 ENCOUNTER — Other Ambulatory Visit: Payer: Self-pay | Admitting: Adult Health

## 2020-08-27 DIAGNOSIS — N289 Disorder of kidney and ureter, unspecified: Secondary | ICD-10-CM

## 2020-08-27 LAB — COMPLETE METABOLIC PANEL WITH GFR
AG Ratio: 1.5 (calc) (ref 1.0–2.5)
ALT: 63 U/L — ABNORMAL HIGH (ref 9–46)
AST: 46 U/L — ABNORMAL HIGH (ref 10–35)
Albumin: 4.6 g/dL (ref 3.6–5.1)
Alkaline phosphatase (APISO): 60 U/L (ref 35–144)
BUN/Creatinine Ratio: 13 (calc) (ref 6–22)
BUN: 18 mg/dL (ref 7–25)
CO2: 23 mmol/L (ref 20–32)
Calcium: 10.1 mg/dL (ref 8.6–10.3)
Chloride: 102 mmol/L (ref 98–110)
Creat: 1.37 mg/dL — ABNORMAL HIGH (ref 0.70–1.25)
GFR, Est African American: 61 mL/min/{1.73_m2} (ref >=60)
GFR, Est Non African American: 53 mL/min/{1.73_m2} — ABNORMAL LOW (ref >=60)
Globulin: 3.1 g/dL (calc) (ref 1.9–3.7)
Glucose, Bld: 104 mg/dL — ABNORMAL HIGH (ref 65–99)
Potassium: 4.1 mmol/L (ref 3.5–5.3)
Sodium: 138 mmol/L (ref 135–146)
Total Bilirubin: 0.5 mg/dL (ref 0.2–1.2)
Total Protein: 7.7 g/dL (ref 6.1–8.1)

## 2020-08-27 LAB — CBC WITH DIFFERENTIAL/PLATELET
Absolute Monocytes: 625 cells/uL (ref 200–950)
Basophils Absolute: 77 cells/uL (ref 0–200)
Basophils Relative: 1.3 %
Eosinophils Absolute: 41 cells/uL (ref 15–500)
Eosinophils Relative: 0.7 %
HCT: 42.1 % (ref 38.5–50.0)
Hemoglobin: 14 g/dL (ref 13.2–17.1)
Lymphs Abs: 1292 cells/uL (ref 850–3900)
MCH: 31.1 pg (ref 27.0–33.0)
MCHC: 33.3 g/dL (ref 32.0–36.0)
MCV: 93.6 fL (ref 80.0–100.0)
MPV: 10.6 fL (ref 7.5–12.5)
Monocytes Relative: 10.6 %
Neutro Abs: 3865 cells/uL (ref 1500–7800)
Neutrophils Relative %: 65.5 %
Platelets: 284 10*3/uL (ref 140–400)
RBC: 4.5 10*6/uL (ref 4.20–5.80)
RDW: 11.8 % (ref 11.0–15.0)
Total Lymphocyte: 21.9 %
WBC: 5.9 10*3/uL (ref 3.8–10.8)

## 2020-08-27 LAB — LIPID PANEL
Cholesterol: 127 mg/dL (ref <200)
HDL: 32 mg/dL — ABNORMAL LOW (ref >=40)
LDL Cholesterol (Calc): 66 mg/dL (calc)
Non-HDL Cholesterol (Calc): 95 mg/dL (calc) (ref <130)
Total CHOL/HDL Ratio: 4 (calc) (ref <5.0)
Triglycerides: 235 mg/dL — ABNORMAL HIGH (ref <150)

## 2020-08-27 LAB — MAGNESIUM: Magnesium: 1.8 mg/dL (ref 1.5–2.5)

## 2020-08-27 LAB — HEMOGLOBIN A1C
Hgb A1c MFr Bld: 6.4 % of total Hgb — ABNORMAL HIGH (ref <5.7)
Mean Plasma Glucose: 137 (calc)
eAG (mmol/L): 7.6 (calc)

## 2020-08-27 LAB — TSH: TSH: 1.82 mIU/L (ref 0.40–4.50)

## 2020-09-16 ENCOUNTER — Other Ambulatory Visit: Payer: Self-pay | Admitting: Internal Medicine

## 2020-09-16 ENCOUNTER — Ambulatory Visit (INDEPENDENT_AMBULATORY_CARE_PROVIDER_SITE_OTHER): Payer: Medicare HMO

## 2020-09-16 ENCOUNTER — Other Ambulatory Visit: Payer: Self-pay

## 2020-09-16 VITALS — BP 120/72 | HR 81 | Temp 97.5°F | Wt 272.0 lb

## 2020-09-16 DIAGNOSIS — Z1152 Encounter for screening for COVID-19: Secondary | ICD-10-CM | POA: Diagnosis not present

## 2020-09-16 DIAGNOSIS — Z79899 Other long term (current) drug therapy: Secondary | ICD-10-CM

## 2020-09-16 DIAGNOSIS — N289 Disorder of kidney and ureter, unspecified: Secondary | ICD-10-CM | POA: Diagnosis not present

## 2020-09-16 LAB — POC COVID19 BINAXNOW: SARS Coronavirus 2 Ag: NEGATIVE

## 2020-09-16 MED ORDER — DEXAMETHASONE 4 MG PO TABS: ORAL_TABLET | ORAL | 0 refills | Status: DC

## 2020-09-16 MED ORDER — AZITHROMYCIN 250 MG PO TABS: ORAL_TABLET | ORAL | 0 refills | Status: DC

## 2020-09-16 MED ORDER — GABAPENTIN 100 MG PO CAPS: ORAL_CAPSULE | ORAL | 1 refills | Status: DC

## 2020-09-16 NOTE — Progress Notes (Signed)
Patient presents to the office for a nurse visit to have labs done and be swabbed for Covid. Symptoms are sinus and chest congestion for 4 days. States that he gets this every year at this time and is prescribed an antibiotic along with prednisone. Vitals taken and recorded.

## 2020-09-17 LAB — BASIC METABOLIC PANEL WITH GFR
BUN: 13 mg/dL (ref 7–25)
CO2: 27 mmol/L (ref 20–32)
Calcium: 10.2 mg/dL (ref 8.6–10.3)
Chloride: 101 mmol/L (ref 98–110)
Creat: 1.04 mg/dL (ref 0.70–1.25)
GFR, Est African American: 85 mL/min/{1.73_m2} (ref >=60)
GFR, Est Non African American: 73 mL/min/{1.73_m2} (ref >=60)
Glucose, Bld: 104 mg/dL — ABNORMAL HIGH (ref 65–99)
Potassium: 4.1 mmol/L (ref 3.5–5.3)
Sodium: 139 mmol/L (ref 135–146)

## 2020-10-14 ENCOUNTER — Ambulatory Visit
Admission: RE | Admit: 2020-10-14 | Discharge: 2020-10-14 | Disposition: A | Discharge status: Home / Self Care | Payer: Medicare HMO | Source: Ambulatory Visit | Attending: Adult Health | Admitting: Adult Health

## 2020-10-14 DIAGNOSIS — R7989 Other specified abnormal findings of blood chemistry: Secondary | ICD-10-CM

## 2020-10-28 ENCOUNTER — Other Ambulatory Visit: Payer: Self-pay

## 2020-10-28 DIAGNOSIS — E782 Mixed hyperlipidemia: Secondary | ICD-10-CM

## 2020-10-28 DIAGNOSIS — I10 Essential (primary) hypertension: Secondary | ICD-10-CM

## 2020-10-28 MED ORDER — FENOFIBRATE 134 MG PO CAPS: ORAL_CAPSULE | ORAL | 3 refills | Status: DC

## 2020-10-28 MED ORDER — LOSARTAN POTASSIUM-HCTZ 100-25 MG PO TABS: ORAL_TABLET | ORAL | 3 refills | Status: DC

## 2020-11-28 ENCOUNTER — Encounter: Payer: Self-pay | Admitting: Internal Medicine

## 2020-11-28 ENCOUNTER — Other Ambulatory Visit: Payer: Self-pay

## 2020-11-28 ENCOUNTER — Ambulatory Visit (INDEPENDENT_AMBULATORY_CARE_PROVIDER_SITE_OTHER): Payer: Medicare HMO | Admitting: Internal Medicine

## 2020-11-28 VITALS — BP 124/84 | HR 62 | Temp 97.8°F | Resp 16 | Ht 74.0 in | Wt 276.4 lb

## 2020-11-28 DIAGNOSIS — E559 Vitamin D deficiency, unspecified: Secondary | ICD-10-CM

## 2020-11-28 DIAGNOSIS — N181 Chronic kidney disease, stage 1: Secondary | ICD-10-CM

## 2020-11-28 DIAGNOSIS — E1169 Type 2 diabetes mellitus with other specified complication: Secondary | ICD-10-CM | POA: Diagnosis not present

## 2020-11-28 DIAGNOSIS — Z79899 Other long term (current) drug therapy: Secondary | ICD-10-CM | POA: Diagnosis not present

## 2020-11-28 DIAGNOSIS — M1A09X0 Idiopathic chronic gout, multiple sites, without tophus (tophi): Secondary | ICD-10-CM | POA: Diagnosis not present

## 2020-11-28 DIAGNOSIS — I10 Essential (primary) hypertension: Secondary | ICD-10-CM | POA: Diagnosis not present

## 2020-11-28 DIAGNOSIS — E1122 Type 2 diabetes mellitus with diabetic chronic kidney disease: Secondary | ICD-10-CM

## 2020-11-28 DIAGNOSIS — E785 Hyperlipidemia, unspecified: Secondary | ICD-10-CM | POA: Diagnosis not present

## 2020-11-28 NOTE — Progress Notes (Signed)
History of Present Illness:       This very nice 70 y.o. MWM presents for 6 month follow up with HTN, HLD, T2_NIDDM and Vitamin D Deficiency.  Patient has Gout controlled on Allopurinol.       Patient is treated for HTN (2010) & BP has been controlled at home. Today's BP is at goal - 124/84. Patient has had no complaints of any cardiac type chest pain, palpitations, dyspnea / orthopnea / PND, dizziness, claudication, or dependent edema.      Hyperlipidemia is controlled with diet/Rosuvastatin. Patient denies myalgias or other med SE's. 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  Last A1c was not at goal:  Lab Results  Component Value Date   HGBA1C 6.4 (H) 08/26/2020           Further, the patient also has history of Vitamin D Deficiency ("23" /2018) and supplements vitamin D without any suspected side-effects. Last vitamin D was still low (goal 70-100):  Lab Results  Component Value Date   VD25OH 48 05/23/2020    Current Outpatient Medications on File Prior to Visit  Medication Sig  . allopurinol 300 MG tablet Take 1 tablet daily to prevent Gout Attack  . VITAMIN C 1000 MG tablet Take 1,000 mg by mouth daily.  Marland Kitchen VITAMIN D 10,000 Units   Take aily.  . TYLENOL PM  25-500 MG  Take 1 tablet at bedtime as needed.  . fenofibrate  134 MG capsule Take 1 capsule Daily   . gabapentin 100 MG capsule Take 1-2 capsules at bedtime.  Marland Kitchen losartan-hctz 100-25 MG tablet Take 1/2 to 1  tablet daily for BP  . metFORMIN-XR 500 MG TAKE  2 TABLETS 2 TIMES A DAY   . Multiple Vitamin  Take 1 tablet  daily.  . naproxen  500 MG tablet Take 1  tablet 2  times daily.  . OTC Flonase  2 sprays per nostril every evening  . rosuvastatin  40 MG tablet Take 1 tablet Daily for Cholesterol  . tadalafil 20 MG tablet Take 1/2-1 tablet every 2-3 days as needed  . zinc 50 MG tablet Take  daily.    No Known Allergies  PMHx:   Past Medical History:  Diagnosis Date  . Diabetes mellitus without complication ([place]) 0177  . Hyperlipidemia 2017  . Hypertension 2010    Immunization History  Administered Date(s) Administered  . Influenza, High Dose Seasonal PF 06/24/2017, 07/21/2018, 08/17/2019, 08/26/2020  . PFIZER(Purple Top)SARS-COV-2 Vaccination 12/27/2019, 01/17/2020  . Pneumococcal Conjugate-13 04/18/2018  . Pneumococcal Polysaccharide-23 05/16/2019  . Tdap 10/14/2015   No pertinent surgical history.  FHx:    Reviewed / unchanged  SHx:    Reviewed / unchanged   Systems Review:  Constitutional: Denies fever, chills, wt changes, headaches, insomnia, fatigue, night sweats, change in appetite. Eyes: Denies redness, blurred vision, diplopia, discharge, itchy, watery eyes.  ENT: Denies discharge, congestion, post nasal drip, epistaxis, sore throat, earache, hearing loss, dental pain, tinnitus, vertigo, sinus pain, snoring.  CV: Denies chest pain, palpitations, irregular heartbeat, syncope, dyspnea, diaphoresis, orthopnea, PND, claudication  or edema. Respiratory: denies cough, dyspnea, DOE, pleurisy, hoarseness, laryngitis, wheezing.  Gastrointestinal: Denies dysphagia, odynophagia, heartburn, reflux, water brash, abdominal pain or cramps, nausea, vomiting, bloating, diarrhea, constipation, hematemesis, melena, hematochezia  or hemorrhoids. Genitourinary: Denies dysuria, frequency, urgency, nocturia, hesitancy, discharge, hematuria or flank pain. Musculoskeletal: Denies arthralgias, myalgias, stiffness, jt. swelling, pain, limping or strain/sprain.  Skin: Denies pruritus, rash, hives, warts, acne, eczema or change in skin  lesion(s). Neuro: No weakness, tremor, incoordination, spasms, paresthesia or pain. Psychiatric: Denies confusion, memory loss or sensory loss. Endo: Denies change in weight, skin or hair change.  Heme/Lymph: No excessive bleeding, bruising or enlarged lymph nodes.  Physical Exam  BP 124/84   Pulse 62   Temp 97.8 F (36.6 C)   Resp 16   Ht 6\' 2"  (1.88 m)   Wt 276 lb 6.4 oz (125.4 kg)   SpO2 98%   BMI 35.49 kg/m   Appears  well nourished, well groomed  and in no distress.  Eyes: PERRLA, EOMs, conjunctiva no swelling or erythema. Sinuses: No frontal/maxillary tenderness ENT/Mouth: EAC's clear, TM's nl w/o erythema, bulging. Nares clear w/o erythema, swelling, exudates. Oropharynx clear without erythema or exudates. Oral hygiene is good. Tongue normal, non obstructing. Hearing intact.  Neck: Supple. Thyroid not palpable. Car 2+/2+ without bruits, nodes or JVD. Chest: Respirations nl with BS clear & equal w/o rales, rhonchi, wheezing or stridor.  Cor: Heart sounds normal w/ regular rate and rhythm without sig. murmurs, gallops, clicks or rubs. Peripheral pulses normal and equal  without edema.  Abdomen: Soft & bowel sounds normal. Non-tender w/o guarding, rebound, hernias, masses or organomegaly.  Lymphatics: Unremarkable.  Musculoskeletal: Full ROM all peripheral extremities, joint stability, 5/5 strength and normal gait.  Skin: Warm, dry without exposed rashes, lesions or ecchymosis apparent.  Neuro: Cranial nerves intact, reflexes equal bilaterally. Sensory-motor testing grossly intact. Tendon reflexes grossly intact.  Pysch: Alert & oriented x 3.  Insight and judgement nl & appropriate. No ideations.  Assessment and Plan:  1. Essential hypertension  - Continue medication, monitor blood pressure at home.  - Continue DASH diet.  Reminder to go to the ER if any CP,  SOB, nausea, dizziness, severe HA, changes vision/speech.  - CBC with Differential/Platelet - COMPLETE METABOLIC  PANEL WITH GFR - Magnesium - TSH  2. Hyperlipidemia associated with type 2 diabetes mellitus ([place])  - Continue diet/meds, exercise,& lifestyle modifications.  - Continue monitor periodic cholesterol/liver & renal functions   - Lipid panel - TSH  3. Type 2 diabetes mellitus with stage 1 chronic kidney  disease, without long-term current use of insulin (HCC)  - Continue diet, exercise  - Lifestyle modifications.  - Monitor appropriate labs.  - Hemoglobin A1c - Insulin, random  4. Vitamin D deficiency  - Continue supplementation.  - VITAMIN D 25 Hydroxy   5. Chronic gout   - Uric acid  6. Medication management  - CBC with Differential/Platelet - COMPLETE METABOLIC PANEL WITH GFR - Magnesium - Lipid panel - TSH - Hemoglobin A1c - Insulin, random - VITAMIN D 25 Hydroxy - Uric acid        Discussed  regular exercise, BP monitoring, weight control to achieve/maintain BMI less than 25 and discussed med and SE's. Recommended labs to assess and monitor clinical status with further disposition pending results of labs.  I discussed the assessment and treatment plan with the patient. The patient was provided an opportunity to ask questions and all were answered. The patient agreed with the plan  and demonstrated an understanding of the instructions.  I provided over 30 minutes of exam, counseling, chart review and  complex critical decision making.         The patient was advised to call back or seek an in-person evaluation if the symptoms worsen or if the condition fails to improve as anticipated.   Kirtland Bouchard, MD\

## 2020-11-28 NOTE — Patient Instructions (Signed)

## 2020-11-29 LAB — MAGNESIUM: Magnesium: 1.9 mg/dL (ref 1.5–2.5)

## 2020-11-29 LAB — COMPLETE METABOLIC PANEL WITH GFR
AG Ratio: 1.8 (calc) (ref 1.0–2.5)
ALT: 72 U/L — ABNORMAL HIGH (ref 9–46)
AST: 50 U/L — ABNORMAL HIGH (ref 10–35)
Albumin: 4.7 g/dL (ref 3.6–5.1)
Alkaline phosphatase (APISO): 54 U/L (ref 35–144)
BUN: 18 mg/dL (ref 7–25)
CO2: 27 mmol/L (ref 20–32)
Calcium: 10.1 mg/dL (ref 8.6–10.3)
Chloride: 102 mmol/L (ref 98–110)
Creat: 0.9 mg/dL (ref 0.70–1.25)
GFR, Est African American: 101 mL/min/{1.73_m2} (ref >=60)
GFR, Est Non African American: 87 mL/min/{1.73_m2} (ref >=60)
Globulin: 2.6 g/dL (calc) (ref 1.9–3.7)
Glucose, Bld: 180 mg/dL — ABNORMAL HIGH (ref 65–99)
Potassium: 4.1 mmol/L (ref 3.5–5.3)
Sodium: 139 mmol/L (ref 135–146)
Total Bilirubin: 0.5 mg/dL (ref 0.2–1.2)
Total Protein: 7.3 g/dL (ref 6.1–8.1)

## 2020-11-29 LAB — HEMOGLOBIN A1C
Hgb A1c MFr Bld: 6.9 % of total Hgb — ABNORMAL HIGH (ref <5.7)
Mean Plasma Glucose: 151 mg/dL
eAG (mmol/L): 8.4 mmol/L

## 2020-11-29 LAB — CBC WITH DIFFERENTIAL/PLATELET
Absolute Monocytes: 659 cells/uL (ref 200–950)
Basophils Absolute: 104 cells/uL (ref 0–200)
Basophils Relative: 1.4 %
Eosinophils Absolute: 59 cells/uL (ref 15–500)
Eosinophils Relative: 0.8 %
HCT: 42.8 % (ref 38.5–50.0)
Hemoglobin: 14.5 g/dL (ref 13.2–17.1)
Lymphs Abs: 1695 cells/uL (ref 850–3900)
MCH: 32.1 pg (ref 27.0–33.0)
MCHC: 33.9 g/dL (ref 32.0–36.0)
MCV: 94.7 fL (ref 80.0–100.0)
MPV: 10.9 fL (ref 7.5–12.5)
Monocytes Relative: 8.9 %
Neutro Abs: 4884 cells/uL (ref 1500–7800)
Neutrophils Relative %: 66 %
Platelets: 283 10*3/uL (ref 140–400)
RBC: 4.52 10*6/uL (ref 4.20–5.80)
RDW: 12.1 % (ref 11.0–15.0)
Total Lymphocyte: 22.9 %
WBC: 7.4 10*3/uL (ref 3.8–10.8)

## 2020-11-29 LAB — LIPID PANEL
Cholesterol: 134 mg/dL (ref <200)
HDL: 32 mg/dL — ABNORMAL LOW (ref >=40)
LDL Cholesterol (Calc): 74 mg/dL (calc)
Non-HDL Cholesterol (Calc): 102 mg/dL (calc) (ref <130)
Total CHOL/HDL Ratio: 4.2 (calc) (ref <5.0)
Triglycerides: 188 mg/dL — ABNORMAL HIGH (ref <150)

## 2020-11-29 LAB — URIC ACID: Uric Acid, Serum: 5.9 mg/dL (ref 4.0–8.0)

## 2020-11-29 LAB — VITAMIN D 25 HYDROXY (VIT D DEFICIENCY, FRACTURES): Vit D, 25-Hydroxy: 44 ng/mL (ref 30–100)

## 2020-11-29 LAB — INSULIN, RANDOM: Insulin: 70.1 u[IU]/mL — ABNORMAL HIGH

## 2020-11-29 LAB — TSH: TSH: 1.45 mIU/L (ref 0.40–4.50)

## 2020-11-29 NOTE — Progress Notes (Signed)
============================================================ ============================================================  -    Glucose = 180 -- >> elevated  - continue your metformin and    - Avoid Sweets, Candy & White Stuff   - Rice, Potatoes, Breads &  Pasta ============================================================ ============================================================  -  Total Chol  = 134 and LDL 74   -  Both  Excellent   - Very low risk for Heart Attack  / Stroke ============================================================ ============================================================  -  A1c = 6.9% - still too high   Again  . . . . . . . . . . Marland Kitchen   - Avoid Sweets, Candy & White Stuff   - Rice, Potatoes, Breads &  Pasta ============================================================ ============================================================  -  Vitamin D = 44  - Vitamin D goal is between 70-100.  [id number] [id number]  - Please make sure that you are taking your Vitamin D as directed.   xxxxxxxxxxxxxxxxxxxxxxxxxxxxxxxxxxxxxxxxxxxx[id number] [id number]  - It is very important as a natural anti-inflammatory and helping the  immune system protect against viral infections, like the Covid-19    helping hair, skin, and nails, as well as reducing stroke and  heart attack risk.   - It helps your bones and helps with mood.  - It also decreases numerous cancer risks so please  take it as directed.   - Low Vit D is associated with a 200-300% higher risk for  CANCER   and 200-300% higher risk for HEART   ATTACK  &  STROKE.    - It is also associated with higher death rate at younger ages,   autoimmune diseases like Rheumatoid arthritis, Lupus,  Multiple Sclerosis.      - Also many other serious conditions, like depression, Alzheimer's  Dementia, infertility, muscle aches, fatigue, fibromyalgia   - just to name a few. ============================================================ ============================================================  -  Uric Acid /Gout test is Normal / OK - Please continue Allopurinol same  ============================================================ ============================================================  -  All Else - CBC - Kidneys - Electrolytes - Liver - Magnesium & Thyroid    - all  Normal / OK ============================================================ ============================================================

## 2020-12-02 ENCOUNTER — Other Ambulatory Visit: Payer: Self-pay | Admitting: *Deleted

## 2020-12-02 DIAGNOSIS — Z79899 Other long term (current) drug therapy: Secondary | ICD-10-CM

## 2020-12-02 MED ORDER — GABAPENTIN 100 MG PO CAPS: ORAL_CAPSULE | ORAL | 1 refills | Status: DC

## 2021-01-29 ENCOUNTER — Other Ambulatory Visit: Payer: Self-pay | Admitting: Internal Medicine

## 2021-01-29 DIAGNOSIS — E782 Mixed hyperlipidemia: Secondary | ICD-10-CM

## 2021-01-29 DIAGNOSIS — I10 Essential (primary) hypertension: Secondary | ICD-10-CM

## 2021-01-29 DIAGNOSIS — Z79899 Other long term (current) drug therapy: Secondary | ICD-10-CM

## 2021-01-29 MED ORDER — GABAPENTIN 300 MG PO CAPS: ORAL_CAPSULE | ORAL | 3 refills | Status: DC

## 2021-01-29 MED ORDER — FENOFIBRATE 134 MG PO CAPS: ORAL_CAPSULE | ORAL | 3 refills | Status: DC

## 2021-01-29 MED ORDER — ROSUVASTATIN CALCIUM 40 MG PO TABS: ORAL_TABLET | ORAL | 3 refills | Status: DC

## 2021-01-29 MED ORDER — LOSARTAN POTASSIUM-HCTZ 100-25 MG PO TABS: ORAL_TABLET | ORAL | 3 refills | Status: DC

## 2021-02-20 ENCOUNTER — Ambulatory Visit: Payer: Medicare HMO | Admitting: Adult Health Nurse Practitioner

## 2021-02-28 ENCOUNTER — Ambulatory Visit (INDEPENDENT_AMBULATORY_CARE_PROVIDER_SITE_OTHER): Payer: Medicare HMO | Admitting: Adult Health

## 2021-02-28 ENCOUNTER — Encounter: Payer: Self-pay | Admitting: Adult Health

## 2021-02-28 ENCOUNTER — Other Ambulatory Visit: Payer: Self-pay

## 2021-02-28 VITALS — BP 122/64 | HR 67 | Temp 96.8°F | Wt 278.0 lb

## 2021-02-28 DIAGNOSIS — Z0001 Encounter for general adult medical examination with abnormal findings: Secondary | ICD-10-CM | POA: Diagnosis not present

## 2021-02-28 DIAGNOSIS — R6889 Other general symptoms and signs: Secondary | ICD-10-CM | POA: Diagnosis not present

## 2021-02-28 DIAGNOSIS — N181 Chronic kidney disease, stage 1: Secondary | ICD-10-CM | POA: Diagnosis not present

## 2021-02-28 DIAGNOSIS — E785 Hyperlipidemia, unspecified: Secondary | ICD-10-CM | POA: Diagnosis not present

## 2021-02-28 DIAGNOSIS — Z0000 Encounter for general adult medical examination without abnormal findings: Secondary | ICD-10-CM

## 2021-02-28 DIAGNOSIS — E1122 Type 2 diabetes mellitus with diabetic chronic kidney disease: Secondary | ICD-10-CM

## 2021-02-28 DIAGNOSIS — I10 Essential (primary) hypertension: Secondary | ICD-10-CM

## 2021-02-28 DIAGNOSIS — E559 Vitamin D deficiency, unspecified: Secondary | ICD-10-CM | POA: Diagnosis not present

## 2021-02-28 DIAGNOSIS — R7989 Other specified abnormal findings of blood chemistry: Secondary | ICD-10-CM | POA: Diagnosis not present

## 2021-02-28 DIAGNOSIS — M1A09X0 Idiopathic chronic gout, multiple sites, without tophus (tophi): Secondary | ICD-10-CM

## 2021-02-28 DIAGNOSIS — E1169 Type 2 diabetes mellitus with other specified complication: Secondary | ICD-10-CM

## 2021-02-28 MED ORDER — TADALAFIL 20 MG PO TABS: ORAL_TABLET | ORAL | 0 refills | Status: DC

## 2021-02-28 NOTE — Patient Instructions (Signed)
  Check with if insurance cover shingrix - 2 shots 2-6 months, can get at pharmacy      Fatty liver or Nonalcoholic fatty liver disease (NASH)  Now the leading cause of liver failure in the [place].  It is normally from such risk factors as obesity, diabetes, insulin resistance, high cholesterol, or metabolic syndrome.  The only definitive therapy is weight loss and exercise.    Suggest walking 20-30 mins daily.  Decreasing carbohydrates, increasing veggies.  Vitamin E 800 IU a day may be beneficial. Liver cancer has been noted in patient with fatty liver without cirrhosis.  Will monitor closely   Fatty Liver Fatty liver is the accumulation of fat in liver cells. It is also called hepatosteatosis or steatohepatitis. It is normal for your liver to contain some fat. If fat is more than 5 to 10% of your liver's weight, you have fatty liver.  There are often no symptoms (problems) for years while damage is still occurring. People often learn about their fatty liver when they have medical tests for other reasons. Fat can damage your liver for years or even decades without causing problems. When it becomes severe, it can cause fatigue, weight loss, weakness, and confusion. This makes you more likely to develop more serious liver problems. The liver is the largest organ in the body. It does a lot of work and often gives no warning signs when it is sick until late in a disease. The liver has many important jobs including:  Breaking down foods.  Storing vitamins, iron, and other minerals.  Making proteins.  Making bile for food digestion.  Breaking down many products including medications, alcohol and some poisons.  PROGNOSIS  Fatty liver may cause no damage or it can lead to an inflammation of the liver. This is, called steatohepatitis.  Over time the liver may become scarred and hardened. This condition is called cirrhosis. Cirrhosis is serious and may lead to liver failure or  cancer. NASH is one of the leading causes of cirrhosis. About 10-20% of Americans have fatty liver and a smaller 2-5% has NASH.  TREATMENT   Weight loss, fat restriction, and exercise in overweight patients produces inconsistent results but is worth trying.  Good control of diabetes may reduce fatty liver.  Eat a balanced, healthy diet.  Increase your physical activity.  There are no medical or surgical treatments for a fatty liver or NASH, but improving your diet and increasing your exercise may help prevent or reverse some of the damage.

## 2021-02-28 NOTE — Progress Notes (Signed)
MEDICARE ANNUAL WELLNESS VISIT AND FOLLOW UP Assessment:   Encounter for Medicare annual wellness exam 1 year  Type 2 diabetes mellitus with stage 1 chronic kidney disease, without long-term current use of insulin ([place]) -     metFORMIN (GLUCOPHAGE-XR) 500 MG 24 hr tablet; Take 2 tablet (1000 mg total) by mouth 2 (two) times daily before a meal. -     Hemoglobin A1c Discussed general issues about diabetes pathophysiology and management., Educational material distributed., Suggested low cholesterol diet., Encouraged aerobic exercise., Discussed foot care., Reminded to get yearly retinal exam. Diabetes eye exam report requested -   Hyperlipidemia associated with type 2 diabetes mellitus ([place]) -     Lipid panel check lipids decrease fatty foods increase activity.  Chronic gout without tophus, unspecified cause, unspecified site - recheck Uric acid as needed, Diet discussed, continue medications. -     Uric acid  Elevated LFTs Reports hx of fatty liver; [place] ordered and pending Check labs, avoid tylenol, alcohol, weight loss advised.  -     COMPLETE METABOLIC PANEL WITH GFR  Morbid obesity - follow up 3 months for progress monitoring - increase veggies, decrease carbs - long discussion about weight loss, diet, and exercise  Vitamin D deficiency Continue supplement  Essential hypertension -     CBC with Differential/Platelet -     COMPLETE METABOLIC PANEL WITH GFR -     TSH - continue medications, DASH diet, exercise and monitor at home. Call if greater than 130/80.   Medication management -     Magnesium  CKD stage 1 due to type 2 diabetes mellitus (HCC) Increase fluids, avoid NSAIDS, monitor sugars, will monitor -     CMP/GFR  Over 30 minutes of exam, counseling, chart review, and critical decision making was performed  Future Appointments  Date Time Provider Gu-Win  06/04/2021 10:00 AM Unk Pinto, MD [facility] None     Plan:   During the course of  the visit the patient was educated and counseled about appropriate screening and preventive services including:    Pneumococcal vaccine   Influenza vaccine  Prevnar 13  Td vaccine  Screening electrocardiogram  Colorectal cancer screening  Diabetes screening  Glaucoma screening  Nutrition counseling    Subjective:  Roger Mcclain is a 70 y.o. male who presents for Medicare Annual Wellness Visit and 3 month follow up for HTN, hyperlipidemia, T2 diabetes, and vitamin D Def. Marland Kitchen   BMI is Body mass index is 35.69 kg/m., he admits hasn't been working on diet and exercise as much, admits was walking 3-4 miles but recently down to 1 mile 3 days a week. 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 Eating a lot of water melon, but also lots of grab and go   Wt Readings from Last 3 Encounters:  02/28/21 278 lb (126.1 kg)  11/28/20 276 lb 6.4 oz (125.4 kg)  09/16/20 272 lb (123.4 kg)   His blood pressure has been controlled at home, today their BP is BP: 122/64 He does workout, does house and yard work since the pandemic. He denies chest pain, shortness of breath, dizziness.   He is on cholesterol medication (rosuvastatin 40 mg daily) and denies myalgias, mom had history of CAD unknown age, nonsmoker. His cholesterol is not at goal. The cholesterol last visit was:   Lab Results  Component Value Date   CHOL 134 11/28/2020   HDL 32 (L) 11/28/2020   LDLCALC 74 11/28/2020  TRIG 188 (H) 11/28/2020   CHOLHDL 4.2 11/28/2020   He has been working on diet and exercise for diabetes - treated by metformin 1000 mg BID. He has ED on cialis. He reports recent fasting glucose are 105-115.  He denies paresthesia of the feet, polydipsia, polyuria and visual disturbances. Last A1C in the office was:  Lab Results  Component Value Date   HGBA1C 6.9 (H) 11/28/2020   With CKD I/II associated with T2DM on losartan/hyzaar. Last GFR Lab Results  Component Value Date   [facility] 87  11/28/2020    Patient is on Vitamin D supplement.   Lab Results  Component Value Date   VD25OH 44 11/28/2020     Patient is on allopurinol for gout and does not report a recent flare.  Lab Results  Component Value Date   LABURIC 5.9 11/28/2020   He has had persistent LFT elevation, had neg hep C, has [place] pending but scheduling issues, does plan to get at some point- today he reports hx of fatty liver Lab Results  Component Value Date   ALT 72 (H) 11/28/2020   AST 50 (H) 11/28/2020   ALKPHOS 49 12/24/2016   BILITOT 0.5 11/28/2020     Medication Review: Current Outpatient Medications on File Prior to Visit  Medication Sig Dispense Refill  . ACCU-CHEK SOFTCLIX LANCETS lancets Use once daily with Accu-Chek meter to check BS reading. Dx. E11.9 100 each 12  . allopurinol (ZYLOPRIM) 300 MG tablet Take 1 tablet daily to prevent Gout Attack 90 tablet 3  . Ascorbic Acid (VITAMIN C) 1000 MG tablet Take 1,000 mg by mouth daily.    . Blood Glucose Monitoring Suppl (ACCU-CHEK AVIVA PLUS) w/Device KIT Use daily to check BS.  Dx. E11.9 1 kit 0  . diphenhydramine-acetaminophen (TYLENOL PM) 25-500 MG TABS tablet Take 1 tablet by mouth at bedtime as needed.    . fenofibrate micronized (LOFIBRA) 134 MG capsule Take  1 capsule  Daily  for Triglycerides (Blood Fats) 90 capsule 3  . gabapentin (NEURONTIN) 300 MG capsule Take  2 capsules  at Bedtime for Sleep 180 capsule 3  . glucose blood test strip Use once daily with Accu-Chek meter to check BS. Dx. E11.9 100 each 12  . losartan-hydrochlorothiazide (HYZAAR) 100-25 MG tablet Take  1  tablet  Daily  for BP 90 tablet 3  . metFORMIN (GLUCOPHAGE-XR) 500 MG 24 hr tablet TAKE  2 TABLETS 2 TIMES A DAY WITH MEALS FOR DIABETES 360 tablet 0  . Multiple Vitamin (MULTIVITAMIN) tablet Take 1 tablet by mouth daily.    Marland Kitchen OVER THE COUNTER MEDICATION OTC Flonase 2 sprays per nostril every evening    . rosuvastatin (CRESTOR) 40 MG tablet Take  1 tablet  Daily  for  Cholesterol 90 tablet 3  . Vitamin D, Ergocalciferol, (DRISDOL) 1.25 MG (50000 UNIT) CAPS capsule Take 50,000 Units by mouth. Takes 1 capsule every other day    . zinc gluconate 50 MG tablet Take 50 mg by mouth daily.     No current facility-administered medications on file prior to visit.    Allergies: No Known Allergies  Current Problems (verified) has Diabetes mellitus ([place]); Essential hypertension; Hyperlipidemia associated with type 2 diabetes mellitus ([place]); Vitamin D deficiency; Morbid obesity ([place]); Gout of multiple sites; Elevated LFTs; CKD stage 1 due to type 2 diabetes mellitus ([place]); and History of COVID-19 on their problem list.  Screening Tests Immunization History  Administered Date(s) Administered  . Influenza, High Dose Seasonal PF  06/24/2017, 07/21/2018, 08/17/2019, 08/26/2020  . PFIZER(Purple Top)SARS-COV-2 Vaccination 12/27/2019, 01/17/2020  . Pneumococcal Conjugate-13 04/18/2018  . Pneumococcal Polysaccharide-23 05/16/2019  . Tdap 10/14/2015    Preventative care: Last colonoscopy:  COLOGUARD- 08/2018 due 08/2021 CT head 2001 MRI lumbar 2000  Prior vaccinations: TD or Tdap: 2017  Influenza: 2021  Pneumococcal: 2020 Prevnar13: 2019 Shingles/Zostavax: N/A COVID: 2/2 pfizer   Names of Other Physician/Practitioners you currently use: 1. [facility] here for primary care 2. [facility] 2021, report requested 3. Dental, last remote, upper dentures, encouraged follow up for bottom   Patient Care Team: Unk Pinto, MD as PCP - General (Internal Medicine)  Surgical: He  has no past surgical history on file. Family His family history includes Diabetes in his father; Heart disease in his mother; Hyperlipidemia in his father; Hypertension in his father and mother.  Unknown age Social history  He reports that he has never smoked. He has never used smokeless tobacco. He reports current alcohol use. He reports that  he does not use drugs.  MEDICARE WELLNESS OBJECTIVES: Physical activity: Current Exercise Habits: Home exercise routine, Type of exercise: walking, Time (Minutes): 60, Frequency (Times/Week): 5, Weekly Exercise (Minutes/Week): 300, Intensity: Mild, Exercise limited by: None identified Cardiac risk factors: Cardiac Risk Factors include: advanced age (>67men, >61 women);dyslipidemia;hypertension;male gender;diabetes mellitus;obesity (BMI >30kg/m2) Depression/mood screen:   Depression screen [facility] 2/9 02/28/2021  Decreased Interest 0  Down, Depressed, Hopeless 0  PHQ - 2 Score 0    ADLs:  In your present state of health, do you have any difficulty performing the following activities: 02/28/2021 05/22/2020  Hearing? N N  Vision? N N  Difficulty concentrating or making decisions? N N  Walking or climbing stairs? N N  Dressing or bathing? N N  Doing errands, shopping? N N  Some recent data might be hidden     Cognitive Testing  Alert? Yes  Normal Appearance?Yes  Oriented to person? Yes  Place? Yes   Time? Yes  Recall of three objects?  Yes  Can perform simple calculations? Yes  Displays appropriate judgment?Yes  Can read the correct time from a watch face?Yes  EOL planning: Does Patient Have a Medical Advance Directive?: Yes Type of Advance Directive: Living will Does patient want to make changes to medical advance directive?: No - Patient declined Would patient like information on creating a medical advance directive?: No - Patient declined   Objective:   Today's Vitals   02/28/21 1107  BP: 122/64  Pulse: 67  Temp: (!) 96.8 F (36 C)  SpO2: 98%  Weight: 278 lb (126.1 kg)   Body mass index is 35.69 kg/m.  General appearance: alert, no distress, WD/WN, male HEENT: normocephalic, sclerae anicteric, TMs pearly, nares patent, no discharge or erythema, pharynx normal Oral cavity: MMM, no lesions Neck: supple, no lymphadenopathy, no thyromegaly, no masses Heart: RRR, normal S1,  S2, no murmurs Lungs: CTA bilaterally, no wheezes, rhonchi, or rales Abdomen: +bs, soft, non tender, non distended, no masses, no hepatomegaly, no splenomegaly Musculoskeletal: nontender, no swelling, no obvious deformity Extremities: no edema, no cyanosis, no clubbing Pulses: 2+ symmetric, upper and lower extremities, normal cap refill Neurological: alert, oriented x 3, CN2-12 intact, strength normal upper extremities and lower extremities, sensation normal throughout, DTRs 2+ throughout, no cerebellar signs, gait normal Psychiatric: normal affect, behavior normal, pleasant   Medicare Attestation I have personally reviewed: The patient's medical and social history Their use of alcohol, tobacco or illicit drugs Their current  medications and supplements The patient's functional ability including ADLs,fall risks, home safety risks, cognitive, and hearing and visual impairment Diet and physical activities Evidence for depression or mood disorders  The patient's weight, height, BMI, and visual acuity have been recorded in the chart.  I have made referrals, counseling, and provided education to the patient based on review of the above and I have provided the patient with a written personalized care plan for preventive services.     Izora Ribas, NP   02/28/2021

## 2021-03-01 LAB — CBC WITH DIFFERENTIAL/PLATELET
Absolute Monocytes: 755 cells/uL (ref 200–950)
Basophils Absolute: 88 cells/uL (ref 0–200)
Basophils Relative: 1.3 %
Eosinophils Absolute: 68 cells/uL (ref 15–500)
Eosinophils Relative: 1 %
HCT: 41.6 % (ref 38.5–50.0)
Hemoglobin: 14.2 g/dL (ref 13.2–17.1)
Lymphs Abs: 1591 cells/uL (ref 850–3900)
MCH: 31.9 pg (ref 27.0–33.0)
MCHC: 34.1 g/dL (ref 32.0–36.0)
MCV: 93.5 fL (ref 80.0–100.0)
MPV: 10.5 fL (ref 7.5–12.5)
Monocytes Relative: 11.1 %
Neutro Abs: 4298 cells/uL (ref 1500–7800)
Neutrophils Relative %: 63.2 %
Platelets: 261 10*3/uL (ref 140–400)
RBC: 4.45 10*6/uL (ref 4.20–5.80)
RDW: 12.3 % (ref 11.0–15.0)
Total Lymphocyte: 23.4 %
WBC: 6.8 10*3/uL (ref 3.8–10.8)

## 2021-03-01 LAB — LIPID PANEL
Cholesterol: 132 mg/dL (ref <200)
HDL: 32 mg/dL — ABNORMAL LOW (ref >=40)
LDL Cholesterol (Calc): 69 mg/dL (calc)
Non-HDL Cholesterol (Calc): 100 mg/dL (calc) (ref <130)
Total CHOL/HDL Ratio: 4.1 (calc) (ref <5.0)
Triglycerides: 217 mg/dL — ABNORMAL HIGH (ref <150)

## 2021-03-01 LAB — HEMOGLOBIN A1C
Hgb A1c MFr Bld: 7.2 % of total Hgb — ABNORMAL HIGH (ref <5.7)
Mean Plasma Glucose: 160 mg/dL
eAG (mmol/L): 8.9 mmol/L

## 2021-03-01 LAB — COMPLETE METABOLIC PANEL WITH GFR
AG Ratio: 1.6 (calc) (ref 1.0–2.5)
ALT: 96 U/L — ABNORMAL HIGH (ref 9–46)
AST: 69 U/L — ABNORMAL HIGH (ref 10–35)
Albumin: 4.6 g/dL (ref 3.6–5.1)
Alkaline phosphatase (APISO): 57 U/L (ref 35–144)
BUN: 14 mg/dL (ref 7–25)
CO2: 24 mmol/L (ref 20–32)
Calcium: 10.2 mg/dL (ref 8.6–10.3)
Chloride: 103 mmol/L (ref 98–110)
Creat: 0.85 mg/dL (ref 0.70–1.25)
GFR, Est African American: 103 mL/min/{1.73_m2} (ref >=60)
GFR, Est Non African American: 89 mL/min/{1.73_m2} (ref >=60)
Globulin: 2.9 g/dL (calc) (ref 1.9–3.7)
Glucose, Bld: 130 mg/dL — ABNORMAL HIGH (ref 65–99)
Potassium: 4.1 mmol/L (ref 3.5–5.3)
Sodium: 139 mmol/L (ref 135–146)
Total Bilirubin: 0.5 mg/dL (ref 0.2–1.2)
Total Protein: 7.5 g/dL (ref 6.1–8.1)

## 2021-03-01 LAB — MAGNESIUM: Magnesium: 2 mg/dL (ref 1.5–2.5)

## 2021-03-01 LAB — TSH: TSH: 1.39 mIU/L (ref 0.40–4.50)

## 2021-05-05 ENCOUNTER — Other Ambulatory Visit: Payer: Self-pay

## 2021-05-05 DIAGNOSIS — E782 Mixed hyperlipidemia: Secondary | ICD-10-CM

## 2021-05-05 DIAGNOSIS — I10 Essential (primary) hypertension: Secondary | ICD-10-CM

## 2021-05-05 MED ORDER — ROSUVASTATIN CALCIUM 40 MG PO TABS: ORAL_TABLET | ORAL | 3 refills | Status: DC

## 2021-05-05 MED ORDER — FENOFIBRATE 134 MG PO CAPS: ORAL_CAPSULE | ORAL | 3 refills | Status: DC

## 2021-05-05 MED ORDER — LOSARTAN POTASSIUM-HCTZ 100-25 MG PO TABS: ORAL_TABLET | ORAL | 3 refills | Status: DC

## 2021-06-03 NOTE — Progress Notes (Signed)
Annual  Screening/Preventative Visit  & Comprehensive Evaluation & Examination  Future Appointments  Date Time Provider [place]  06/04/2021  - CPE  10:00 AM Unk Pinto, MD [facility] None  03/04/2022  - Wellness 11:00 AM Liane Comber, NP [facility] None  06/04/2022 10:00 AM Unk Pinto, MD [facility] None            This very nice 70 y.o. MWM presents for a Screening /Preventative Visit & comprehensive evaluation and management of multiple medical co-morbidities.  Patient has been followed for HTN, HLD, T2_NIDDM  and Vitamin D Deficiency. Patient has Gout controlled on his meds.       HTN predates since 2010. Patient's BP has been controlled at home.  Today's BP was initially slightly elevated & rechecked at goal  - 136/76. Patient denies any cardiac symptoms as chest pain, palpitations, shortness of breath, dizziness or ankle swelling.       Patient's hyperlipidemia is controlled with diet and Rosuvastatin /Fenofibrate. Patient denies myalgias or other medication SE's. 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 Last A1c was not at goal:   Lab Results  Component Value Date   HGBA1C 7.2 (H) 02/28/2021          Finally, patient has history of Vitamin D Deficiency ("23" /2018) and last vitamin D was still low (goal 70-100):    Lab Results  Component Value Date   VD25OH 44 11/28/2020     Current Outpatient Medications on File Prior to Visit  Medication Sig   allopurinol 300 MG tablet Take 1 tablet daily    VITAMIN C 1000 MG tablet Take daily.   TYLENOL PM  '25mg'$  -500 MG  Take 1 tablet at bedtime  as needed.   Fenofibrate 134 MG capsule Take 1 capsule daily    gabapentin  300 MG capsule Take  2 capsules  at Bedtime for Sleep   losartan-hctz 100-25 MG tablet Take one tablet daily for blood pressure.   metFORMIN -XR 500 MG 2 TAKE  2 TABLETS 2 TIMES A DAY    Multiple Vitamin  Take 1 tablet  daily.   OTC Flonase  2 sprays per nostril every evening   rosuvastatin 40 MG tablet Take 1 tablet daily for Cholesterol.   tadalafil 20 MG tablet Take 1/2 to 1 tablet every 2 to 3 days as needed    Vitamin D 50,000 u  Takes 1 capsule every other day   zinc gluconate 50 MG tablet Take  daily.    No Known Allergies   Past Medical History:  Diagnosis Date   Diabetes mellitus without complication ([place]) 0000000   Fatty liver    Hyperlipidemia 2017   Hypertension 2010     Health Maintenance  Topic Date Due   OPHTHALMOLOGY EXAM  Never done   Zoster Vaccines- Shingrix (1 of 2) Never done   COVID-19 Vaccine (3 - Pfizer risk series) 02/14/2020   INFLUENZA VACCINE  05/05/2021   FOOT EXAM  05/22/2021   Fecal DNA (Cologuard)  08/16/2021   HEMOGLOBIN A1C  08/31/2021  TETANUS/TDAP  10/13/2025   Hepatitis C Screening  Completed   PNA vac Low Risk Adult  Completed   HPV VACCINES  Aged Out     Immunization History  Administered Date(s) Administered   Influenza, High Dose Seasonal PF 06/24/2017, 07/21/2018, 08/17/2019, 08/26/2020   PFIZER(Purple Top)SARS-COV-2 Vaccination 12/27/2019, 01/17/2020   Pneumococcal Conjugate-13 04/18/2018   Pneumococcal Polysaccharide-23 05/16/2019   Tdap 10/14/2015    - Cologard - 08/23/2018 - Negative - 3 year f/u due Nov 2022  No past surgical history on file.   Family History  Problem Relation Age of Onset   Hypertension Mother    Heart disease Mother    Diabetes Father    Hyperlipidemia Father    Hypertension Father     Social History   Socioeconomic History   Marital status: Married    Spouse name: Edd Fabian   Number of children: Not on file   Occupational History   Retired Administrator  Tobacco Use   Smoking status: Never   Smokeless tobacco: Never  Substance and Sexual Activity   Alcohol use: Yes   Drug use: No   Sexual activity: Yes    ROS Constitutional: Denies fever, chills, weight loss/gain, headaches, insomnia,  night sweats or change in appetite. Does c/o fatigue. Eyes: Denies redness, blurred vision, diplopia, discharge, itchy or watery eyes.  ENT: Denies discharge, congestion, post nasal drip, epistaxis, sore throat, earache, hearing loss, dental pain, Tinnitus, Vertigo, Sinus pain or snoring.  Cardio: Denies chest pain, palpitations, irregular heartbeat, syncope, dyspnea, diaphoresis, orthopnea, PND, claudication or edema Respiratory: denies cough, dyspnea, DOE, pleurisy, hoarseness, laryngitis or wheezing.  Gastrointestinal: Denies dysphagia, heartburn, reflux, water brash, pain, cramps, nausea, vomiting, bloating, diarrhea, constipation, hematemesis, melena, hematochezia, jaundice or hemorrhoids Genitourinary: Denies dysuria, frequency, urgency, nocturia, hesitancy, discharge, hematuria or flank pain Musculoskeletal: Denies arthralgia, myalgia, stiffness, Jt. Swelling, pain, limp or strain/sprain. Denies Falls. Skin: Denies puritis, rash, hives, warts, acne, eczema or change in skin lesion Neuro: No weakness, tremor, incoordination, spasms, paresthesia or pain Psychiatric: Denies confusion, memory loss or sensory loss. Denies Depression. Endocrine: Denies change in weight, skin, hair change, nocturia, and paresthesia, diabetic polys, visual blurring or hyper / hypo glycemic episodes.  Heme/Lymph: No excessive bleeding, bruising or enlarged lymph nodes.   Physical Exam  BP 136/76   Pulse 87   Temp 97.8 F (36.6 C)   Resp 16   Ht '6\' 2"'$  (1.88 m)   Wt 280 lb 12.8 oz (127.4 kg)   SpO2 98%   BMI 36.05 kg/m   General Appearance: over nourished and well groomed and in no apparent distress.  Eyes: PERRLA,  EOMs, conjunctiva no swelling or erythema, normal fundi and vessels. Sinuses: No frontal/maxillary tenderness ENT/Mouth: EACs patent / TMs  nl. Nares clear without erythema, swelling, mucoid exudates. Oral hygiene is good. No erythema, swelling, or exudate. Tongue normal, non-obstructing. Tonsils not swollen or erythematous. Hearing normal.  Neck: Supple, thyroid not palpable. No bruits, nodes or JVD. Respiratory: Respiratory effort normal.  BS equal and clear bilateral without rales, rhonci, wheezing or stridor. Cardio: Heart sounds are normal with regular rate and rhythm and no murmurs, rubs or gallops. Peripheral pulses are normal and equal bilaterally without edema. No aortic or femoral bruits. Chest: symmetric with normal excursions and percussion.  Abdomen: Soft, with Nl bowel sounds. Nontender, no guarding, rebound, hernias, masses, or organomegaly.  Lymphatics: Non tender without lymphadenopathy.  Musculoskeletal: Full ROM all peripheral extremities, joint stability, 5/5 strength, and normal gait. Skin: Warm  and dry without rashes, lesions, cyanosis, clubbing or  ecchymosis.  Neuro: Cranial nerves intact, reflexes equal bilaterally. Normal muscle tone, no cerebellar symptoms. Sensation intact.  Pysch: Alert and oriented X 3 with normal affect, insight and judgment appropriate.   Assessment and Plan  1. Annual Preventative/Screening Exam    2. Class 2 severe obesity due to excess calories with serious comorbidity  and body mass index (BMI) of 37.0 to 37.9 in adult ([place])   3. Essential hypertension  - EKG 12-Lead - [place], RETROPERITNL ABD,  LTD - Urinalysis, Routine w reflex microscopic - Microalbumin / creatinine urine ratio - CBC with Differential/Platelet - COMPLETE METABOLIC PANEL WITH GFR - Magnesium - TSH  4. Hyperlipidemia associated with type 2 diabetes mellitus ([place])  - EKG 12-Lead - [place], RETROPERITNL ABD,  LTD - Lipid panel  5. Type 2 diabetes mellitus with stage 2  chronic kidney  disease, without long-term current use of insulin ([facility])  - EKG 12-Lead - [place], RETROPERITNL ABD,  LTD - HM DIABETES FOOT EXAM - LOW EXTREMITY NEUR EXAM DOCUM - COMPLETE METABOLIC PANEL WITH GFR - Hemoglobin A1c - Insulin, random  6. Vitamin D deficiency  - VITAMIN D 25 Hydroxy   7. Chronic gout   - Uric acid  8. BPH with obstruction/lower urinary tract symptoms  - PSA  9. Screening for colorectal cancer  - Cologuard; Future  10. Screening for prostate cancer  - PSA  11. Screening for ischemic heart disease  - EKG 12-Lead  12. FHx: heart disease  - EKG 12-Lead - [place], RETROPERITNL ABD,  LTD  13. Screening for AAA (aortic abdominal aneurysm)  - [place], RETROPERITNL ABD,  LTD  14. Medication management  - Urinalysis, Routine w reflex microscopic - Microalbumin / creatinine urine ratio - CBC with Differential/Platelet - COMPLETE METABOLIC PANEL WITH GFR - Magnesium - Lipid panel - TSH - Hemoglobin A1c - Insulin, random - VITAMIN D 25 Hydroxy         Patient was counseled in prudent diet, weight control to achieve/maintain BMI less than 25, BP monitoring, regular exercise and medications as discussed.  Discussed med effects and SE's. Routine screening labs and tests as requested with regular follow-up as recommended. Over 40 minutes of exam, counseling, chart review and high complex critical decision making was performed   Kirtland Bouchard, MD

## 2021-06-04 ENCOUNTER — Other Ambulatory Visit: Payer: Self-pay

## 2021-06-04 ENCOUNTER — Encounter: Payer: Self-pay | Admitting: Internal Medicine

## 2021-06-04 ENCOUNTER — Ambulatory Visit (INDEPENDENT_AMBULATORY_CARE_PROVIDER_SITE_OTHER): Payer: Medicare HMO | Admitting: Internal Medicine

## 2021-06-04 VITALS — BP 136/76 | HR 87 | Temp 97.8°F | Resp 16 | Ht 74.0 in | Wt 280.8 lb

## 2021-06-04 DIAGNOSIS — M1A09X0 Idiopathic chronic gout, multiple sites, without tophus (tophi): Secondary | ICD-10-CM

## 2021-06-04 DIAGNOSIS — Z8249 Family history of ischemic heart disease and other diseases of the circulatory system: Secondary | ICD-10-CM

## 2021-06-04 DIAGNOSIS — E1169 Type 2 diabetes mellitus with other specified complication: Secondary | ICD-10-CM

## 2021-06-04 DIAGNOSIS — E1122 Type 2 diabetes mellitus with diabetic chronic kidney disease: Secondary | ICD-10-CM

## 2021-06-04 DIAGNOSIS — Z0000 Encounter for general adult medical examination without abnormal findings: Secondary | ICD-10-CM | POA: Diagnosis not present

## 2021-06-04 DIAGNOSIS — N182 Chronic kidney disease, stage 2 (mild): Secondary | ICD-10-CM

## 2021-06-04 DIAGNOSIS — N401 Enlarged prostate with lower urinary tract symptoms: Secondary | ICD-10-CM | POA: Diagnosis not present

## 2021-06-04 DIAGNOSIS — I10 Essential (primary) hypertension: Secondary | ICD-10-CM | POA: Diagnosis not present

## 2021-06-04 DIAGNOSIS — Z6837 Body mass index (BMI) 37.0-37.9, adult: Secondary | ICD-10-CM

## 2021-06-04 DIAGNOSIS — E785 Hyperlipidemia, unspecified: Secondary | ICD-10-CM | POA: Diagnosis not present

## 2021-06-04 DIAGNOSIS — N138 Other obstructive and reflux uropathy: Secondary | ICD-10-CM

## 2021-06-04 DIAGNOSIS — Z0001 Encounter for general adult medical examination with abnormal findings: Secondary | ICD-10-CM

## 2021-06-04 DIAGNOSIS — Z136 Encounter for screening for cardiovascular disorders: Secondary | ICD-10-CM

## 2021-06-04 DIAGNOSIS — Z79899 Other long term (current) drug therapy: Secondary | ICD-10-CM | POA: Diagnosis not present

## 2021-06-04 DIAGNOSIS — Z1211 Encounter for screening for malignant neoplasm of colon: Secondary | ICD-10-CM

## 2021-06-04 DIAGNOSIS — E559 Vitamin D deficiency, unspecified: Secondary | ICD-10-CM

## 2021-06-04 DIAGNOSIS — Z125 Encounter for screening for malignant neoplasm of prostate: Secondary | ICD-10-CM

## 2021-06-04 NOTE — Patient Instructions (Signed)

## 2021-06-05 LAB — CBC WITH DIFFERENTIAL/PLATELET
Absolute Monocytes: 456 cells/uL (ref 200–950)
Basophils Absolute: 58 cells/uL (ref 0–200)
Basophils Relative: 1.1 %
Eosinophils Absolute: 42 cells/uL (ref 15–500)
Eosinophils Relative: 0.8 %
HCT: 41.4 % (ref 38.5–50.0)
Hemoglobin: 13.6 g/dL (ref 13.2–17.1)
Lymphs Abs: 1283 cells/uL (ref 850–3900)
MCH: 31.1 pg (ref 27.0–33.0)
MCHC: 32.9 g/dL (ref 32.0–36.0)
MCV: 94.5 fL (ref 80.0–100.0)
MPV: 10.8 fL (ref 7.5–12.5)
Monocytes Relative: 8.6 %
Neutro Abs: 3461 cells/uL (ref 1500–7800)
Neutrophils Relative %: 65.3 %
Platelets: 257 10*3/uL (ref 140–400)
RBC: 4.38 10*6/uL (ref 4.20–5.80)
RDW: 11.9 % (ref 11.0–15.0)
Total Lymphocyte: 24.2 %
WBC: 5.3 10*3/uL (ref 3.8–10.8)

## 2021-06-05 LAB — URINALYSIS, ROUTINE W REFLEX MICROSCOPIC
Bilirubin Urine: NEGATIVE
Glucose, UA: NEGATIVE
Hgb urine dipstick: NEGATIVE
Ketones, ur: NEGATIVE
Leukocytes,Ua: NEGATIVE
Nitrite: NEGATIVE
Protein, ur: NEGATIVE
Specific Gravity, Urine: 1.016 (ref 1.001–1.035)
pH: 6.5 (ref 5.0–8.0)

## 2021-06-05 LAB — MAGNESIUM: Magnesium: 1.9 mg/dL (ref 1.5–2.5)

## 2021-06-05 LAB — HEMOGLOBIN A1C
Hgb A1c MFr Bld: 7.2 % of total Hgb — ABNORMAL HIGH (ref <5.7)
Mean Plasma Glucose: 160 mg/dL
eAG (mmol/L): 8.9 mmol/L

## 2021-06-05 LAB — LIPID PANEL
Cholesterol: 150 mg/dL (ref <200)
HDL: 30 mg/dL — ABNORMAL LOW (ref >=40)
LDL Cholesterol (Calc): 81 mg/dL (calc)
Non-HDL Cholesterol (Calc): 120 mg/dL (calc) (ref <130)
Total CHOL/HDL Ratio: 5 (calc) — ABNORMAL HIGH (ref <5.0)
Triglycerides: 323 mg/dL — ABNORMAL HIGH (ref <150)

## 2021-06-05 LAB — COMPLETE METABOLIC PANEL WITH GFR
AG Ratio: 1.7 (calc) (ref 1.0–2.5)
ALT: 98 U/L — ABNORMAL HIGH (ref 9–46)
AST: 87 U/L — ABNORMAL HIGH (ref 10–35)
Albumin: 4.5 g/dL (ref 3.6–5.1)
Alkaline phosphatase (APISO): 54 U/L (ref 35–144)
BUN: 16 mg/dL (ref 7–25)
CO2: 26 mmol/L (ref 20–32)
Calcium: 9.6 mg/dL (ref 8.6–10.3)
Chloride: 102 mmol/L (ref 98–110)
Creat: 1.06 mg/dL (ref 0.70–1.35)
Globulin: 2.7 g/dL (calc) (ref 1.9–3.7)
Glucose, Bld: 165 mg/dL — ABNORMAL HIGH (ref 65–99)
Potassium: 4 mmol/L (ref 3.5–5.3)
Sodium: 138 mmol/L (ref 135–146)
Total Bilirubin: 0.6 mg/dL (ref 0.2–1.2)
Total Protein: 7.2 g/dL (ref 6.1–8.1)
eGFR: 76 mL/min/{1.73_m2} (ref >=60)

## 2021-06-05 LAB — MICROALBUMIN / CREATININE URINE RATIO
Creatinine, Urine: 59 mg/dL (ref 20–320)
Microalb, Ur: <0.2 mg/dL

## 2021-06-05 LAB — PSA: PSA: 2.03 ng/mL (ref <=4.00)

## 2021-06-05 LAB — URIC ACID: Uric Acid, Serum: 6.1 mg/dL (ref 4.0–8.0)

## 2021-06-05 LAB — TSH: TSH: 2.48 mIU/L (ref 0.40–4.50)

## 2021-06-05 LAB — INSULIN, RANDOM: Insulin: 58.3 u[IU]/mL — ABNORMAL HIGH

## 2021-06-05 NOTE — Progress Notes (Signed)
============================================================ ============================================================  -  PSA - Low - Great ! ============================================================ ============================================================  - Uric Acid / Gout test - Normal & OK - Continue Allopurinol ============================================================ ============================================================  - Liver enzymes are still elevated & most likely due to "Fatty Liver"  which is   the leading cause of Liver cancer & the ONLY treatment is weight loss  !    -  So please get on a stricter diet & lose weight ! ============================================================ ============================================================  - Total Chol = 150   -   Excellent   - Very low risk for Heart Attack  / Stroke ============================================================ ============================================================  -  But ,  Triglycerides (   323   ) or fats in blood are too high !                 (  Ideal or Goal is less than 150)    - Recommend avoid fried & greasy foods,  sweets / candy,   - Avoid white rice  (brown or wild rice or Quinoa is OK),   - Avoid white potatoes  (sweet potatoes are OK)   - Avoid anything made from white flour  - bagels, doughnuts, rolls, buns, biscuits, white and   wheat breads, pizza crust and traditional  pasta made of white flour & egg white  - (vegetarian pasta or spinach or wheat pasta is OK).    - Multi-grain bread is OK - like multi-grain flat bread or  sandwich thins.   - Avoid alcohol in excess.   - Exercise is also important. ============================================================ ============================================================  - A1c = 7.2% - still too high          (   Ideal or Goal is less than 5.7%  !  )    Being diabetic has a  300%  increased risk for heart attack,  stroke, cancer, and alzheimer- type vascular dementia.   It is very important that you work harder with diet by  avoiding all foods that are white except chicken,   fish & calliflower.  - Avoid white rice  (brown & wild rice is OK),   - Avoid white potatoes  (sweet potatoes in moderation is OK),   White bread or wheat bread or anything made out of   white flour like bagels, donuts, rolls, buns, biscuits, cakes,  - pastries, cookies, pizza crust, and pasta (made from  white flour & egg whites)   - vegetarian pasta or spinach or wheat pasta is OK.  - Multigrain breads like Arnold's, Pepperidge Farm or   multigrain sandwich thins or high fiber breads like   [place] bread or "Dave's Killer" breads that are  4 to 5 grams fiber per slice !  are best.    Diet, exercise and weight loss can reverse and cure  diabetes in the early stages.    - Diet, exercise and weight loss is very important in the   control and prevention of complications of diabetes which  affects every system in your body, ie.   -Brain - dementia/stroke,  - eyes - glaucoma/blindness,  - heart - heart attack/heart failure,  - kidneys - dialysis,  - stomach - gastric paralysis,  - intestines - malabsorption,  - nerves - severe painful neuritis,  - circulation - gangrene & loss of a leg(s)  - and finally  . . . . . . . . . . . . . . . . . .    - cancer  and Alzheimers. ============================================================ ============================================================  - All Else - CBC - Kidneys - Electrolytes - Liver - Magnesium & Thyroid    - all  Normal / OK ============================================================ ============================================================

## 2021-06-10 ENCOUNTER — Other Ambulatory Visit: Payer: Self-pay

## 2021-06-10 DIAGNOSIS — Z79899 Other long term (current) drug therapy: Secondary | ICD-10-CM

## 2021-06-10 MED ORDER — GABAPENTIN 300 MG PO CAPS
ORAL_CAPSULE | ORAL | 3 refills | Status: DC
Start: 2021-06-10 — End: 2022-06-04

## 2021-07-08 ENCOUNTER — Other Ambulatory Visit: Payer: Self-pay | Admitting: Internal Medicine

## 2021-07-08 DIAGNOSIS — E1122 Type 2 diabetes mellitus with diabetic chronic kidney disease: Secondary | ICD-10-CM

## 2021-07-08 DIAGNOSIS — N181 Chronic kidney disease, stage 1: Secondary | ICD-10-CM

## 2021-09-01 DIAGNOSIS — E119 Type 2 diabetes mellitus without complications: Secondary | ICD-10-CM | POA: Diagnosis not present

## 2021-09-01 DIAGNOSIS — H40033 Anatomical narrow angle, bilateral: Secondary | ICD-10-CM | POA: Diagnosis not present

## 2021-09-01 LAB — HM DIABETES EYE EXAM

## 2021-09-30 ENCOUNTER — Other Ambulatory Visit: Payer: Self-pay | Admitting: Nurse Practitioner

## 2021-09-30 DIAGNOSIS — R7989 Other specified abnormal findings of blood chemistry: Secondary | ICD-10-CM

## 2021-09-30 NOTE — Progress Notes (Signed)
FOLLOW UP  Assessment and Plan:   Hypertension Well controlled with current medications  Monitor blood pressure at home; patient to call if consistently greater than 130/80 Continue DASH diet.   Reminder to go to the ER if any CP, SOB, nausea, dizziness, severe HA, changes vision/speech, left arm numbness and tingling and jaw pain.  Cholesterol Currently very near goal - LDL <70 ; continue rosuvastatin, fenofibrate; diet discussed Continue low cholesterol diet and exercise.  Check lipid panel.   Diabetes with diabetic chronic kidney disease Continue medication: metformin Continue diet and exercise.  Perform daily foot/skin check, notify office of any concerning changes.  Check A1C  CKD 1 associated with T2DM Increase fluids, avoid NSAIDS, monitor sugars, will monitor CMP  Morbid obesity - BMI 35 with co morbidities Long discussion about weight loss, diet, and exercise Recommended diet heavy in fruits and veggies and low in animal meats, cheeses, and dairy products, appropriate calorie intake Discussed ideal weight for height  Patient will work on portions of potatoes, rice, beer Will follow up in 3 months  Vitamin D Def Below goal at last visit; he has changed dose continue supplementation to maintain goal of 70-100 Check Vit D level  Erectile dysfunction related to DM Continue Cialis- refilled today Control blood sugars  Gout Continue allopurinol Diet discussed Check uric acid as needed  Elevated LTFs Trending up, elevated GGT in 07/2018 and was recommended hepatitis panel and [place] but never got [place] - cancelled U/S 10/14/20 had eaten that day and they said it wouldn't have a good reading, refuses to have another Recheck CMP for LFTs avoid tylenol, alcohol, weight loss advised.   Frontal sinusitis/nosebleed Amoxicillin 500 mg TID x 10 Encouraged use of humidifier Mucinex as needed  Need for influenza vaccine -high dose quadrivalent administered without  complication  Continue diet and meds as discussed. Further disposition pending results of labs. Discussed med's effects and SE's.   Over 30 minutes of exam, counseling, chart review, and critical decision making was performed.   Future Appointments  Date Time Provider [place]  10/02/2021  9:30 AM Magda Bernheim, NP [facility] None  12/31/2021  9:30 AM Unk Pinto, MD [facility] None  03/04/2022 11:00 AM Liane Comber, NP [facility] None  06/04/2022 10:00 AM Unk Pinto, MD [facility] None    ----------------------------------------------------------------------------------------------------------------------  HPI 70 y.o. male  presents for 3 month follow up on hypertension, cholesterol, diabetes, obesity and vitamin D deficiency.   He is using Cialis for erectile dysfunction, helping currently  He does have frontal sinus pain associated with nasal congestion and nosebleeds.  Has a history of sinusitis which recurs every winter.  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 Reports he has essentially cut bread and starches. Drinks 5-6 bottles of water daily. He feels he can lose weight by lifestyle only, his goal is <262 lb Wt Readings from Last 3 Encounters:  10/02/21 276 lb 6.4 oz (125.4 kg)  06/04/21 280 lb 12.8 oz (127.4 kg)  02/28/21 278 lb (126.1 kg)   His blood pressure has been controlled at home, today their BP is BP: 130/62 BP Readings from Last 3 Encounters:  10/02/21 130/62  06/04/21 136/76  02/28/21 122/64     He does not workout but works intense job. He denies chest pain, shortness of breath, dizziness.   He is on cholesterol medication Rosuvastatin 40 mg  daily and fenofibrate 134 mg daily and denies myalgias. His cholesterol is not at goal. The cholesterol last visit was:   Lab Results  Component Value Date    CHOL 150 06/04/2021   HDL 30 (L) 06/04/2021   LDLCALC 81 06/04/2021   TRIG 323 (H) 06/04/2021   CHOLHDL 5.0 (H) 06/04/2021    He has been working on diet and exercise for T2DM well controlled by lifestyle and metformin 1000 mg daily, and denies foot ulcerations, increased appetite, nausea, paresthesia of the feet, polydipsia, polyuria and visual disturbances. He does check fasting sugars typically runs 100-120.  Last A1C in the office was:  Lab Results  Component Value Date   HGBA1C 7.2 (H) 06/04/2021   Patient is on Vitamin D supplement and increased dose after last visit, now taking 5000 x 2 tab:  Lab Results  Component Value Date   VD25OH 44 11/28/2020     Patient is on allopurinol for gout (reports he takes intermittently) and does not report a recent flare.  Lab Results  Component Value Date   LABURIC 6.1 06/04/2021   He has had LFT elevations trending up; GGT on 07/21/2018 was 93; iron and saturation were low with elevated ferritin; he had negative hep C 08/2019. US abdomen was ordered he went twice to have done and they did not do because he ate- wants to recheck LFTs before pursuing another [place]. He has fib 4 score of 2.33, approximate [place] of 2-3.  Lab Results  Component Value Date   ALT 98 (H) 06/04/2021   AST 87 (H) 06/04/2021   ALKPHOS 49 12/24/2016   BILITOT 0.6 06/04/2021     Current Medications:  Current Outpatient Medications on File Prior to Visit  Medication Sig   allopurinol (ZYLOPRIM) 300 MG tablet Take 1 tablet daily to prevent Gout Attack   Ascorbic Acid (VITAMIN C) 1000 MG tablet Take 1,000 mg by mouth daily.   diphenhydramine-acetaminophen (TYLENOL PM) 25-500 MG TABS tablet Take 1 tablet by mouth at bedtime as needed.   fenofibrate micronized (LOFIBRA) 134 MG capsule Take 1 capsule daily for Triglycerides (Blood Fats)   gabapentin (NEURONTIN) 300 MG capsule Take  2 capsules  at Bedtime for Sleep   losartan-hydrochlorothiazide (HYZAAR) 100-25 MG tablet  Take one tablet daily for blood pressure.   metFORMIN (GLUCOPHAGE-XR) 500 MG 24 hr tablet TAKE  2 TABLETS 2 TIMES A DAY WITH MEALS FOR DIABETES   Multiple Vitamin (MULTIVITAMIN) tablet Take 1 tablet by mouth daily.   rosuvastatin (CRESTOR) 40 MG tablet Take 1 tablet daily for Cholesterol.   tadalafil (CIALIS) 20 MG tablet Take 1/2 to 1 tablet every 2 to 3 days as needed for XXXX   Vitamin D, Ergocalciferol, (DRISDOL) 1.25 MG (50000 UNIT) CAPS capsule Take 50,000 Units by mouth. Takes 1 capsule every other day   zinc gluconate 50 MG tablet Take 50 mg by mouth daily.   ACCU-CHEK SOFTCLIX LANCETS lancets Use once daily with Accu-Chek meter to check BS reading. Dx. E11.9   Blood Glucose Monitoring Suppl (ACCU-CHEK AVIVA PLUS) w/Device KIT Use daily to check BS.  Dx. E11.9   glucose blood test strip Use once daily with Accu-Chek meter to check BS. Dx. E11.9   OVER THE COUNTER MEDICATION OTC Flonase 2 sprays per nostril every evening (Patient not taking: Reported on 10/02/2021)   No current facility-administered medications on file prior to visit.      Allergies: No Known Allergies   Medical History:  Past Medical History:  Diagnosis Date   Diabetes mellitus without complication ([place]) 7340   Fatty liver    Hyperlipidemia 2017   Hypertension 2010   Family history- Reviewed and unchanged Social history- Reviewed and unchanged   Review of Systems:  Review of Systems  Constitutional:  Negative for malaise/fatigue and weight loss.  HENT:  Positive for congestion, nosebleeds and sinus pain. Negative for hearing loss and tinnitus.   Eyes:  Negative for blurred vision and double vision.  Respiratory:  Negative for cough, shortness of breath and wheezing.   Cardiovascular:  Negative for chest pain, palpitations, orthopnea, claudication and leg swelling.  Gastrointestinal:  Negative for abdominal pain, blood in stool, constipation, diarrhea, heartburn, melena, nausea and vomiting.   Genitourinary: Negative.   Musculoskeletal:  Negative for joint pain and myalgias.  Skin:  Negative for rash.  Neurological:  Negative for dizziness, tingling, sensory change, weakness and headaches.  Endo/Heme/Allergies:  Negative for polydipsia.  Psychiatric/Behavioral: Negative.  Negative for depression.   All other systems reviewed and are negative.    Physical Exam: BP 130/62    Pulse 81    Temp (!) 97.2 F (36.2 C)    Wt 276 lb 6.4 oz (125.4 kg)    SpO2 98%    BMI 35.49 kg/m  Wt Readings from Last 3 Encounters:  10/02/21 276 lb 6.4 oz (125.4 kg)  06/04/21 280 lb 12.8 oz (127.4 kg)  02/28/21 278 lb (126.1 kg)   General Appearance: Pleasant obese male, in no apparent distress. Eyes: PERRLA, EOMs, conjunctiva no swelling or erythema Sinuses: Positive frontal tenderness bilaterally ENT/Mouth: Ext aud canals clear, TMs fluid bilaterally, dull. No erythema, swelling, or exudate on post pharynx.  Tonsils not swollen or erythematous. Hearing normal.  Neck: Supple, thyroid normal.  Respiratory: Respiratory effort normal, BS equal bilaterally without rales, rhonchi, wheezing or stridor.  Cardio: RRR with no MRGs. Brisk peripheral pulses without edema.  Abdomen: Soft, + BS.  Non tender, no guarding, rebound, hernias, masses. Lymphatics: Non tender without lymphadenopathy.  Musculoskeletal: Full ROM, 5/5 strength, Normal gait Skin: Warm, dry without rashes, lesions, ecchymosis.  Neuro: Cranial nerves intact. No cerebellar symptoms.  Psych: Awake and oriented X 3, normal affect, Insight and Judgment appropriate.    Magda Bernheim, NP 9:24 AM [name]

## 2021-10-02 ENCOUNTER — Ambulatory Visit (INDEPENDENT_AMBULATORY_CARE_PROVIDER_SITE_OTHER): Payer: Medicare HMO | Admitting: Nurse Practitioner

## 2021-10-02 ENCOUNTER — Other Ambulatory Visit: Payer: Self-pay

## 2021-10-02 ENCOUNTER — Encounter: Payer: Self-pay | Admitting: Nurse Practitioner

## 2021-10-02 ENCOUNTER — Encounter: Payer: Self-pay | Admitting: Internal Medicine

## 2021-10-02 VITALS — BP 130/62 | HR 81 | Temp 97.2°F | Wt 276.4 lb

## 2021-10-02 DIAGNOSIS — R040 Epistaxis: Secondary | ICD-10-CM | POA: Diagnosis not present

## 2021-10-02 DIAGNOSIS — R7989 Other specified abnormal findings of blood chemistry: Secondary | ICD-10-CM

## 2021-10-02 DIAGNOSIS — N521 Erectile dysfunction due to diseases classified elsewhere: Secondary | ICD-10-CM

## 2021-10-02 DIAGNOSIS — E785 Hyperlipidemia, unspecified: Secondary | ICD-10-CM

## 2021-10-02 DIAGNOSIS — E559 Vitamin D deficiency, unspecified: Secondary | ICD-10-CM

## 2021-10-02 DIAGNOSIS — M1A09X0 Idiopathic chronic gout, multiple sites, without tophus (tophi): Secondary | ICD-10-CM

## 2021-10-02 DIAGNOSIS — J0111 Acute recurrent frontal sinusitis: Secondary | ICD-10-CM

## 2021-10-02 DIAGNOSIS — E1169 Type 2 diabetes mellitus with other specified complication: Secondary | ICD-10-CM | POA: Diagnosis not present

## 2021-10-02 DIAGNOSIS — Z23 Encounter for immunization: Secondary | ICD-10-CM

## 2021-10-02 DIAGNOSIS — I10 Essential (primary) hypertension: Secondary | ICD-10-CM

## 2021-10-02 DIAGNOSIS — E1122 Type 2 diabetes mellitus with diabetic chronic kidney disease: Secondary | ICD-10-CM

## 2021-10-02 DIAGNOSIS — N181 Chronic kidney disease, stage 1: Secondary | ICD-10-CM | POA: Diagnosis not present

## 2021-10-02 MED ORDER — AMOXICILLIN 500 MG PO TABS: 500.0000 mg | ORAL_TABLET | Freq: Three times a day (TID) | ORAL | 0 refills | Status: DC

## 2021-10-02 MED ORDER — TADALAFIL 20 MG PO TABS: ORAL_TABLET | ORAL | 0 refills | Status: DC

## 2021-10-02 NOTE — Patient Instructions (Signed)

## 2021-10-03 LAB — COMPLETE METABOLIC PANEL WITH GFR
AG Ratio: 1.7 (calc) (ref 1.0–2.5)
ALT: 123 U/L — ABNORMAL HIGH (ref 9–46)
AST: 110 U/L — ABNORMAL HIGH (ref 10–35)
Albumin: 4.5 g/dL (ref 3.6–5.1)
Alkaline phosphatase (APISO): 61 U/L (ref 35–144)
BUN: 11 mg/dL (ref 7–25)
CO2: 24 mmol/L (ref 20–32)
Calcium: 9.9 mg/dL (ref 8.6–10.3)
Chloride: 100 mmol/L (ref 98–110)
Creat: 1.13 mg/dL (ref 0.70–1.35)
Globulin: 2.7 g/dL (calc) (ref 1.9–3.7)
Glucose, Bld: 308 mg/dL — ABNORMAL HIGH (ref 65–99)
Potassium: 4.5 mmol/L (ref 3.5–5.3)
Sodium: 137 mmol/L (ref 135–146)
Total Bilirubin: 0.7 mg/dL (ref 0.2–1.2)
Total Protein: 7.2 g/dL (ref 6.1–8.1)
eGFR: 70 mL/min/{1.73_m2} (ref >=60)

## 2021-10-03 LAB — CBC WITH DIFFERENTIAL/PLATELET
Absolute Monocytes: 520 cells/uL (ref 200–950)
Basophils Absolute: 91 cells/uL (ref 0–200)
Basophils Relative: 1.4 %
Eosinophils Absolute: 20 cells/uL (ref 15–500)
Eosinophils Relative: 0.3 %
HCT: 43.5 % (ref 38.5–50.0)
Hemoglobin: 14.3 g/dL (ref 13.2–17.1)
Lymphs Abs: 1235 cells/uL (ref 850–3900)
MCH: 31.4 pg (ref 27.0–33.0)
MCHC: 32.9 g/dL (ref 32.0–36.0)
MCV: 95.4 fL (ref 80.0–100.0)
MPV: 11.2 fL (ref 7.5–12.5)
Monocytes Relative: 8 %
Neutro Abs: 4635 cells/uL (ref 1500–7800)
Neutrophils Relative %: 71.3 %
Platelets: 255 10*3/uL (ref 140–400)
RBC: 4.56 10*6/uL (ref 4.20–5.80)
RDW: 11.7 % (ref 11.0–15.0)
Total Lymphocyte: 19 %
WBC: 6.5 10*3/uL (ref 3.8–10.8)

## 2021-10-03 LAB — HEMOGLOBIN A1C
Hgb A1c MFr Bld: 9.9 % of total Hgb — ABNORMAL HIGH (ref <5.7)
Mean Plasma Glucose: 237 mg/dL
eAG (mmol/L): 13.2 mmol/L

## 2021-10-03 LAB — LIPID PANEL
Cholesterol: 154 mg/dL (ref <200)
HDL: 32 mg/dL — ABNORMAL LOW (ref >=40)
LDL Cholesterol (Calc): 84 mg/dL (calc)
Non-HDL Cholesterol (Calc): 122 mg/dL (calc) (ref <130)
Total CHOL/HDL Ratio: 4.8 (calc) (ref <5.0)
Triglycerides: 313 mg/dL — ABNORMAL HIGH (ref <150)

## 2021-10-07 ENCOUNTER — Other Ambulatory Visit: Payer: Self-pay | Admitting: Nurse Practitioner

## 2021-10-07 DIAGNOSIS — R7989 Other specified abnormal findings of blood chemistry: Secondary | ICD-10-CM

## 2021-10-14 ENCOUNTER — Other Ambulatory Visit: Payer: Self-pay

## 2021-10-14 ENCOUNTER — Ambulatory Visit (HOSPITAL_COMMUNITY)
Admission: RE | Admit: 2021-10-14 | Discharge: 2021-10-14 | Disposition: A | Discharge status: Home / Self Care | Payer: Medicare HMO | Source: Ambulatory Visit | Attending: Nurse Practitioner | Admitting: Nurse Practitioner

## 2021-10-14 DIAGNOSIS — K760 Fatty (change of) liver, not elsewhere classified: Secondary | ICD-10-CM | POA: Diagnosis not present

## 2021-10-14 DIAGNOSIS — R7989 Other specified abnormal findings of blood chemistry: Secondary | ICD-10-CM | POA: Insufficient documentation

## 2021-10-14 DIAGNOSIS — N281 Cyst of kidney, acquired: Secondary | ICD-10-CM | POA: Diagnosis not present

## 2021-10-15 ENCOUNTER — Encounter: Payer: Self-pay | Admitting: Nurse Practitioner

## 2021-10-15 DIAGNOSIS — N281 Cyst of kidney, acquired: Secondary | ICD-10-CM | POA: Insufficient documentation

## 2021-10-16 ENCOUNTER — Other Ambulatory Visit: Payer: Self-pay | Admitting: Nurse Practitioner

## 2021-10-16 DIAGNOSIS — G4700 Insomnia, unspecified: Secondary | ICD-10-CM

## 2021-10-16 MED ORDER — TRAZODONE HCL 50 MG PO TABS: ORAL_TABLET | ORAL | 2 refills | Status: DC

## 2021-10-16 NOTE — Progress Notes (Signed)
I sent Trazodone to [facility]. Take 1 hour prior to bedtime

## 2021-10-17 ENCOUNTER — Other Ambulatory Visit: Payer: Self-pay | Admitting: Internal Medicine

## 2021-10-17 DIAGNOSIS — G4700 Insomnia, unspecified: Secondary | ICD-10-CM

## 2021-10-17 MED ORDER — TRAZODONE HCL 50 MG PO TABS: ORAL_TABLET | ORAL | 1 refills | Status: DC

## 2021-10-21 ENCOUNTER — Other Ambulatory Visit: Payer: Self-pay | Admitting: Internal Medicine

## 2021-10-21 DIAGNOSIS — G4700 Insomnia, unspecified: Secondary | ICD-10-CM

## 2021-10-21 MED ORDER — TRAZODONE HCL 50 MG PO TABS: ORAL_TABLET | ORAL | 1 refills | Status: DC

## 2021-11-03 ENCOUNTER — Other Ambulatory Visit: Payer: Self-pay | Admitting: Internal Medicine

## 2021-11-03 MED ORDER — HYDROXYZINE HCL 50 MG PO TABS: ORAL_TABLET | ORAL | 0 refills | Status: DC

## 2021-11-05 ENCOUNTER — Telehealth: Payer: Self-pay

## 2021-11-05 NOTE — Telephone Encounter (Signed)
Patient said the trazodone is causing him to have headaches and he still isn't able to sleep. Is there something else he can try?  [facility] [place]

## 2021-11-05 NOTE — Telephone Encounter (Signed)
Dr. Melford Aase sent in Atarax for him to try for sleep, sent the prescription on the 30th

## 2021-11-26 ENCOUNTER — Other Ambulatory Visit: Payer: Self-pay | Admitting: Internal Medicine

## 2021-11-26 MED ORDER — HYDROXYZINE HCL 50 MG PO TABS: ORAL_TABLET | ORAL | 0 refills | Status: DC

## 2021-12-30 ENCOUNTER — Encounter: Payer: Self-pay | Admitting: Internal Medicine

## 2021-12-30 NOTE — Patient Instructions (Signed)

## 2021-12-30 NOTE — Progress Notes (Signed)
? ?Future Appointments  ?Date Time Provider Department  ?12/31/2021  9:30 AM Unk Pinto, MD [facility]  ?03/04/2022                 Wellness 11:00 AM Liane Comber, NP [facility]  ?06/04/2022                  CPE 10:00 AM Unk Pinto, MD [facility]  ? ? ?History of Present Illness: ? ? ?    This very nice 71 y.o. MWM presents for  6 month follow up with HTN, HLD, T2_NIDDM and Vitamin D Deficiency. Patient has Gout controlled on his meds. ? ? ?    Patient is treated for HTN (2010) & BP has been controlled at home. Today?s BP is at goal  -  118/84.  Patient has lost 31# in the last 3-6 months and has cut his BP meds in 1/2 .  Patient has had no complaints of any cardiac type chest pain, palpitations, dyspnea / orthopnea / PND, dizziness, claudication, or dependent edema. ? ? ?    Hyperlipidemia is controlled with diet & Rosuvastatin Venia Minks. Patient denies myalgias or other med SE?s. Last Lipids were at goal except elevated Trig's : ? ?Lab Results  ?Component Value Date  ? CHOL 154 10/02/2021  ? HDL 32 (L) 10/02/2021  ? [place] 84 10/02/2021  ? TRIG 313 (H) 10/02/2021  ? CHOLHDL 4.8 10/02/2021  ? ? ? 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  Last A1c was not at goal : ? ?Lab Results  ?Component Value Date  ? HGBA1C 9.9 (H) 10/02/2021  ? ?    ? ?                                                  Further, the patient also has history of Vitamin D Deficiency ("23" /2018) and supplements vitamin D without any suspected side-effects. Last vitamin D was low  ( goal is 70-100) : ? ? ?Lab Results  ?Component Value Date  ? VD25OH 44 11/28/2020  ? ? ? ?Current Outpatient Medications on File Prior to Visit  ?Medication Sig  ? allopurinol  300 MG tablet Take 1 tablet daily to prevent Gout Attack  ? VITAMIN C 1000 MG tablet Take 1,000 mg  daily.  ?  g TYLENOL PM  25-500 MG  Take 1 tablet at bedtime as needed.  ? fenofibrate  134 MG capsule Take 1 capsule daily   ? gabapentin 300 MG capsule Take  2 capsules  at Bedtime for Sleep  ? hydrOXYzine  50 MG tablet Take  1 to 2 tablets  1 or 2 hours  before Bedtime  as needed  for Sleep  ? losartan-hctz 100-25 MG tablet Take one tablet daily for blood pressure.  ? metFORMIN -XR 500 MG  TAKE  2 TABS 2 TIMES A DAY WITH MEALS   ? Multiple Vitamin  Take 1 tablet  daily.  ? rosuvastatin  40 MG tablet Take 1 tablet daily for Cholesterol.  ? tadalafil 20 MG tablet Take 1/2 to 1 tablet every 2 to 3 days as needed   ? Vitamin D  50,000 u cap[ Takes 1 capsule every other day  ?  zinc  50 MG tablet Take daily.  ? ? ?No Known Allergies ? ? ?PMHx:   ?Past Medical History:  ?Diagnosis Date  ? Diabetes mellitus without complication ([place]) 8938  ? Fatty liver   ? Hyperlipidemia 2017  ? Hypertension 2010  ? ? ? ?Immunization History  ?Administered Date(s) Administered  ? Influenza, High Dose  06/24/2017, 07/21/2018, 08/17/2019, 08/26/2020, 10/02/2021  ? PFIZER SARS-COV-2 Vacc 12/27/2019, 01/17/2020  ? Pneumococcal -13 04/18/2018  ? Pneumococcal -23 05/16/2019  ? Tdap 10/14/2015  ? ? ?History reviewed. No pertinent surgical history. ? ?FHx:    Reviewed / unchanged ? ?SHx:    Reviewed / unchanged  ? ?Systems Review: ? ?Constitutional: Denies fever, chills, wt changes, headaches, insomnia, fatigue, night sweats, change in appetite. ?Eyes: Denies redness, blurred vision, diplopia, discharge, itchy, watery eyes.  ?ENT: Denies discharge, congestion, post nasal drip, epistaxis, sore throat, earache, hearing loss, dental pain, tinnitus, vertigo, sinus pain, snoring.  ?CV: Denies chest pain, palpitations, irregular heartbeat, syncope, dyspnea, diaphoresis, orthopnea, PND, claudication or edema. ?Respiratory: denies cough, dyspnea, DOE, pleurisy, hoarseness, laryngitis, wheezing.  ?Gastrointestinal: Denies dysphagia, odynophagia, heartburn, reflux, water brash,  abdominal pain or cramps, nausea, vomiting, bloating, diarrhea, constipation, hematemesis, melena, hematochezia  or hemorrhoids. ?Genitourinary: Denies dysuria, frequency, urgency, nocturia, hesitancy, discharge, hematuria or flank pain. ?Musculoskeletal: Denies arthralgias, myalgias, stiffness, jt. swelling, pain, limping or strain/sprain.  ?Skin: Denies pruritus, rash, hives, warts, acne, eczema or change in skin lesion(s). ?Neuro: No weakness, tremor, incoordination, spasms, paresthesia or pain. ?Psychiatric: Denies confusion, memory loss or sensory loss. ?Endo: Denies change in weight, skin or hair change.  ?Heme/Lymph: No excessive bleeding, bruising or enlarged lymph nodes. ? ?Physical Exam ? ?BP 118/84   Pulse 65   Temp 97.9 ?F (36.6 ?C)   Resp 16   Ht '6\' 2"'$  (1.88 m)   Wt 252 lb 3.2 oz (114.4 kg)   SpO2 98%   BMI 32.38 kg/m?  ? ?Appears  well nourished, well groomed  and in no distress. ? ?Eyes: PERRLA, EOMs, conjunctiva no swelling or erythema. ?Sinuses: No frontal/maxillary tenderness ?ENT/Mouth: EAC's clear, TM's nl w/o erythema, bulging. Nares clear w/o erythema, swelling, exudates. Oropharynx clear without erythema or exudates. Oral hygiene is good. Tongue normal, non obstructing. Hearing intact.  ?Neck: Supple. Thyroid not palpable. Car 2+/2+ without bruits, nodes or JVD. ?Chest: Respirations nl with BS clear & equal w/o rales, rhonchi, wheezing or stridor.  ?Cor: Heart sounds normal w/ regular rate and rhythm without sig. murmurs, gallops, clicks or rubs. Peripheral pulses normal and equal  without edema.  ?Abdomen: Soft & bowel sounds normal. Non-tender w/o guarding, rebound, hernias, masses or organomegaly.  ?Lymphatics: Unremarkable.  ?Musculoskeletal: Full ROM all peripheral extremities, joint stability, 5/5 strength and normal gait.  ?Skin: Warm, dry without exposed rashes, lesions or ecchymosis apparent.  ?Neuro: Cranial nerves intact, reflexes equal bilaterally. Sensory-motor testing  grossly intact. Tendon reflexes grossly intact.  ?Pysch: Alert & oriented x 3.  Insight and judgement nl & appropriate. No ideations. ? ?Assessment and Plan: ? ?1. Essential hypertension ? ?- Continue medication, monitor blood pressure at home.  ?- Continue DASH diet.  Reminder to go to the ER if any CP,  ?SOB, nausea, dizziness, severe HA, changes vision/speech. ?  ?- CBC with Differential/Platelet ?- COMPLETE METABOLIC PANEL WITH GFR ?- Magnesium ?- TSH ? ?2. Hyperlipidemia associated with type 2 diabetes mellitus ([place]) ? ?- Continue diet/meds, exercise,& lifestyle modifications.  ?- Continue monitor periodic cholesterol/liver & renal functions  ?  ?-  Lipid panel ?- TSH ? ?3. 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 ?- Hemoglobin A1c ?- Insulin, random ? ?4. Vitamin D deficiency ? ?- Continue diet, exercise  ?- Lifestyle modifications.  ?- Monitor appropriate labs. ?- Continue supplementation. ?   ?- VITAMIN D 25 Hydroxy  ? ?5. Chronic gout  ? ?- Uric acid ? ?6. Medication management ? ?- CBC with Differential/Platelet ?- COMPLETE METABOLIC PANEL WITH GFR ?- Magnesium ?- Lipid panel ?- TSH ?- Hemoglobin A1c ?- Insulin, random ?- VITAMIN D 25 Hydroxy  ? ?7. Erectile dysfunction associated with type 2 diabetes mellitus ([place]) ? ?- tadalafil (CIALIS) 20 MG tablet;  ?Take 1/2 to 1 tablet every 2 to 3 days as needed   ?Dispense: 30 tablet; Refill: 2 ? ? ? ?      Discussed  regular exercise, BP monitoring, weight control to achieve/maintain BMI less than 25 and discussed med and SE's. Recommended labs to assess and monitor clinical status with further disposition pending results of labs.  I discussed the assessment and treatment plan with the patient. The patient was provided an opportunity to ask questions and all were answered. The patient agreed with the plan and demonstrated an understanding of the instructions.  I provided over 30 minutes of  exam, counseling, chart review and  complex critical decision making. ? ?      The patient was advised to call back or seek an in-person evaluation if the symptoms worsen or if the condition fails to improve as anticipa

## 2021-12-31 ENCOUNTER — Ambulatory Visit (INDEPENDENT_AMBULATORY_CARE_PROVIDER_SITE_OTHER): Payer: Medicare HMO | Admitting: Internal Medicine

## 2021-12-31 ENCOUNTER — Encounter: Payer: Self-pay | Admitting: Internal Medicine

## 2021-12-31 ENCOUNTER — Other Ambulatory Visit: Payer: Self-pay

## 2021-12-31 VITALS — BP 118/84 | HR 65 | Temp 97.9°F | Resp 16 | Ht 74.0 in | Wt 252.2 lb

## 2021-12-31 DIAGNOSIS — M1A09X0 Idiopathic chronic gout, multiple sites, without tophus (tophi): Secondary | ICD-10-CM | POA: Diagnosis not present

## 2021-12-31 DIAGNOSIS — Z79899 Other long term (current) drug therapy: Secondary | ICD-10-CM | POA: Diagnosis not present

## 2021-12-31 DIAGNOSIS — E559 Vitamin D deficiency, unspecified: Secondary | ICD-10-CM

## 2021-12-31 DIAGNOSIS — I10 Essential (primary) hypertension: Secondary | ICD-10-CM

## 2021-12-31 DIAGNOSIS — E1122 Type 2 diabetes mellitus with diabetic chronic kidney disease: Secondary | ICD-10-CM

## 2021-12-31 DIAGNOSIS — E785 Hyperlipidemia, unspecified: Secondary | ICD-10-CM | POA: Diagnosis not present

## 2021-12-31 DIAGNOSIS — N182 Chronic kidney disease, stage 2 (mild): Secondary | ICD-10-CM

## 2021-12-31 DIAGNOSIS — N521 Erectile dysfunction due to diseases classified elsewhere: Secondary | ICD-10-CM | POA: Diagnosis not present

## 2021-12-31 DIAGNOSIS — E1169 Type 2 diabetes mellitus with other specified complication: Secondary | ICD-10-CM | POA: Diagnosis not present

## 2021-12-31 MED ORDER — TADALAFIL 20 MG PO TABS: ORAL_TABLET | ORAL | 2 refills | Status: DC

## 2022-01-01 LAB — COMPLETE METABOLIC PANEL WITH GFR
AG Ratio: 1.6 (calc) (ref 1.0–2.5)
ALT: 48 U/L — ABNORMAL HIGH (ref 9–46)
AST: 39 U/L — ABNORMAL HIGH (ref 10–35)
Albumin: 4.5 g/dL (ref 3.6–5.1)
Alkaline phosphatase (APISO): 62 U/L (ref 35–144)
BUN: 13 mg/dL (ref 7–25)
CO2: 25 mmol/L (ref 20–32)
Calcium: 10 mg/dL (ref 8.6–10.3)
Chloride: 104 mmol/L (ref 98–110)
Creat: 1.02 mg/dL (ref 0.70–1.28)
Globulin: 2.8 g/dL (calc) (ref 1.9–3.7)
Glucose, Bld: 191 mg/dL — ABNORMAL HIGH (ref 65–99)
Potassium: 4.4 mmol/L (ref 3.5–5.3)
Sodium: 138 mmol/L (ref 135–146)
Total Bilirubin: 0.5 mg/dL (ref 0.2–1.2)
Total Protein: 7.3 g/dL (ref 6.1–8.1)
eGFR: 79 mL/min/{1.73_m2} (ref >=60)

## 2022-01-01 LAB — LIPID PANEL
Cholesterol: 182 mg/dL (ref <200)
HDL: 36 mg/dL — ABNORMAL LOW (ref >=40)
LDL Cholesterol (Calc): 114 mg/dL (calc) — ABNORMAL HIGH
Non-HDL Cholesterol (Calc): 146 mg/dL (calc) — ABNORMAL HIGH (ref <130)
Total CHOL/HDL Ratio: 5.1 (calc) — ABNORMAL HIGH (ref <5.0)
Triglycerides: 208 mg/dL — ABNORMAL HIGH (ref <150)

## 2022-01-01 LAB — TSH: TSH: 1.32 mIU/L (ref 0.40–4.50)

## 2022-01-01 LAB — INSULIN, RANDOM: Insulin: 12.4 u[IU]/mL

## 2022-01-01 LAB — HEMOGLOBIN A1C
Hgb A1c MFr Bld: 12.2 % of total Hgb — ABNORMAL HIGH (ref <5.7)
Mean Plasma Glucose: 303 mg/dL
eAG (mmol/L): 16.8 mmol/L

## 2022-01-01 LAB — CBC WITH DIFFERENTIAL/PLATELET
Absolute Monocytes: 456 cells/uL (ref 200–950)
Basophils Absolute: 72 cells/uL (ref 0–200)
Basophils Relative: 1.5 %
Eosinophils Absolute: 29 cells/uL (ref 15–500)
Eosinophils Relative: 0.6 %
HCT: 41.5 % (ref 38.5–50.0)
Hemoglobin: 13.6 g/dL (ref 13.2–17.1)
Lymphs Abs: 1190 cells/uL (ref 850–3900)
MCH: 30.6 pg (ref 27.0–33.0)
MCHC: 32.8 g/dL (ref 32.0–36.0)
MCV: 93.3 fL (ref 80.0–100.0)
MPV: 11.2 fL (ref 7.5–12.5)
Monocytes Relative: 9.5 %
Neutro Abs: 3053 cells/uL (ref 1500–7800)
Neutrophils Relative %: 63.6 %
Platelets: 271 10*3/uL (ref 140–400)
RBC: 4.45 10*6/uL (ref 4.20–5.80)
RDW: 11.3 % (ref 11.0–15.0)
Total Lymphocyte: 24.8 %
WBC: 4.8 10*3/uL (ref 3.8–10.8)

## 2022-01-01 LAB — VITAMIN D 25 HYDROXY (VIT D DEFICIENCY, FRACTURES): Vit D, 25-Hydroxy: 73 ng/mL (ref 30–100)

## 2022-01-01 LAB — MAGNESIUM: Magnesium: 1.9 mg/dL (ref 1.5–2.5)

## 2022-01-01 LAB — URIC ACID: Uric Acid, Serum: 4.1 mg/dL (ref 4.0–8.0)

## 2022-01-01 NOTE — Progress Notes (Signed)
<><><><><><><><><><><><><><><><><><><><><><><><><><><><><><><><><> ?<><><><><><><><><><><><><><><><><><><><><><><><><><><><><><><><><> ? ?-    A1c = 12.2% - Horrible  !  Way too High ! ! !      Sitting on a time bomb ! ! !  ?              (  Ideal or Goal is less than 5.7%  !  )  ? ?- Will send in new Rx for [facility] - shot once / weekly  ? ? After use up 1st 4 pens in 1 month,  call  &  ?                                                    if not losing weight will increase dose every month  ?<><><><><><><><><><><><><><><><><><><><><><><><><><><><><><><><><> ?<><><><><><><><><><><><><><><><><><><><><><><><><><><><><><><><><> ? ?- Vitamin D = 73 -  Excellent -- Please keep dose same  ?<><><><><><><><><><><><><><><><><><><><><><><><><><><><><><><><><> ?<><><><><><><><><><><><><><><><><><><><><><><><><><><><><><><><><> ? ?- Uric Acid / Gout test is Normal  - Please continue Allopurinol same  ?<><><><><><><><><><><><><><><><><><><><><><><><><><><><><><><><><> ?<><><><><><><><><><><><><><><><><><><><><><><><><><><><><><><><><> ? ?- All Else - CBC - Kidneys - Electrolytes - Liver - Magnesium & Thyroid   ? ?- all  Normal / OK ?<><><><><><><><><><><><><><><><><><><><><><><><><><><><><><><><><> ?<><><><><><><><><><><><><><><><><><><><><><><><><><><><><><><><><> ? ? ? ? ? ? ? ? ? ?

## 2022-01-15 ENCOUNTER — Other Ambulatory Visit: Payer: Self-pay

## 2022-01-15 DIAGNOSIS — E1122 Type 2 diabetes mellitus with diabetic chronic kidney disease: Secondary | ICD-10-CM

## 2022-01-15 MED ORDER — ACCU-CHEK AVIVA PLUS W/DEVICE KIT: PACK | 0 refills | Status: AC

## 2022-01-15 MED ORDER — GLUCOSE BLOOD VI STRP: ORAL_STRIP | 12 refills | Status: DC

## 2022-01-15 MED ORDER — ACCU-CHEK SOFTCLIX LANCETS MISC: 12 refills | Status: AC

## 2022-03-04 ENCOUNTER — Ambulatory Visit (INDEPENDENT_AMBULATORY_CARE_PROVIDER_SITE_OTHER): Payer: Medicare HMO | Admitting: Nurse Practitioner

## 2022-03-04 ENCOUNTER — Encounter: Payer: Self-pay | Admitting: Nurse Practitioner

## 2022-03-04 VITALS — BP 130/90 | HR 72 | Temp 96.6°F | Resp 16 | Wt 250.2 lb

## 2022-03-04 DIAGNOSIS — Z0001 Encounter for general adult medical examination with abnormal findings: Secondary | ICD-10-CM | POA: Diagnosis not present

## 2022-03-04 DIAGNOSIS — E785 Hyperlipidemia, unspecified: Secondary | ICD-10-CM

## 2022-03-04 DIAGNOSIS — R6889 Other general symptoms and signs: Secondary | ICD-10-CM | POA: Diagnosis not present

## 2022-03-04 DIAGNOSIS — N181 Chronic kidney disease, stage 1: Secondary | ICD-10-CM

## 2022-03-04 DIAGNOSIS — I10 Essential (primary) hypertension: Secondary | ICD-10-CM | POA: Diagnosis not present

## 2022-03-04 DIAGNOSIS — M545 Low back pain, unspecified: Secondary | ICD-10-CM

## 2022-03-04 DIAGNOSIS — E559 Vitamin D deficiency, unspecified: Secondary | ICD-10-CM

## 2022-03-04 DIAGNOSIS — R7989 Other specified abnormal findings of blood chemistry: Secondary | ICD-10-CM

## 2022-03-04 DIAGNOSIS — E1122 Type 2 diabetes mellitus with diabetic chronic kidney disease: Secondary | ICD-10-CM | POA: Diagnosis not present

## 2022-03-04 DIAGNOSIS — N281 Cyst of kidney, acquired: Secondary | ICD-10-CM | POA: Diagnosis not present

## 2022-03-04 DIAGNOSIS — Z79899 Other long term (current) drug therapy: Secondary | ICD-10-CM

## 2022-03-04 DIAGNOSIS — M1A09X0 Idiopathic chronic gout, multiple sites, without tophus (tophi): Secondary | ICD-10-CM

## 2022-03-04 DIAGNOSIS — E1169 Type 2 diabetes mellitus with other specified complication: Secondary | ICD-10-CM

## 2022-03-04 DIAGNOSIS — Z0000 Encounter for general adult medical examination without abnormal findings: Secondary | ICD-10-CM

## 2022-03-04 NOTE — Patient Instructions (Signed)
Preventive Care 65 Years and Older, Male Preventive care refers to lifestyle choices and visits with your health care provider that can promote health and wellness. Preventive care visits are also called wellness exams. What can I expect for my preventive care visit? Counseling During your preventive care visit, your health care provider may ask about your: Medical history, including: Past medical problems. Family medical history. History of falls. Current health, including: Emotional well-being. Home life and relationship well-being. Sexual activity. Memory and ability to understand (cognition). Lifestyle, including: Alcohol, nicotine or tobacco, and drug use. Access to firearms. Diet, exercise, and sleep habits. Work and work environment. Sunscreen use. Safety issues such as seatbelt and bike helmet use. Physical exam Your health care provider will check your: Height and weight. These may be used to calculate your BMI (body mass index). BMI is a measurement that tells if you are at a healthy weight. Waist circumference. This measures the distance around your waistline. This measurement also tells if you are at a healthy weight and may help predict your risk of certain diseases, such as type 2 diabetes and high blood pressure. Heart rate and blood pressure. Body temperature. Skin for abnormal spots. What immunizations do I need?  Vaccines are usually given at various ages, according to a schedule. Your health care provider will recommend vaccines for you based on your age, medical history, and lifestyle or other factors, such as travel or where you work. What tests do I need? Screening Your health care provider may recommend screening tests for certain conditions. This may include: Lipid and cholesterol levels. Diabetes screening. This is done by checking your blood sugar (glucose) after you have not eaten for a while (fasting). Hepatitis C test. Hepatitis B test. HIV (human  immunodeficiency virus) test. STI (sexually transmitted infection) testing, if you are at risk. Lung cancer screening. Colorectal cancer screening. Prostate cancer screening. Abdominal aortic aneurysm (AAA) screening. You may need this if you are a current or former smoker. Talk with your health care provider about your test results, treatment options, and if necessary, the need for more tests. Follow these instructions at home: Eating and drinking  Eat a diet that includes fresh fruits and vegetables, whole grains, lean protein, and low-fat dairy products. Limit your intake of foods with high amounts of sugar, saturated fats, and salt. Take vitamin and mineral supplements as recommended by your health care provider. Do not drink alcohol if your health care provider tells you not to drink. If you drink alcohol: Limit how much you have to 0-2 drinks a day. Know how much alcohol is in your drink. In the [place] one drink equals one 12 oz bottle of beer (355 mL), one 5 oz glass of wine (148 mL), or one 1 oz glass of hard liquor (44 mL). Lifestyle Brush your teeth every morning and night with fluoride toothpaste. Floss one time each day. Exercise for at least 30 minutes 5 or more days each week. Do not use any products that contain nicotine or tobacco. These products include cigarettes, chewing tobacco, and vaping devices, such as e-cigarettes. If you need help quitting, ask your health care provider. Do not use drugs. If you are sexually active, practice safe sex. Use a condom or other form of protection to prevent STIs. Take aspirin only as told by your health care provider. Make sure that you understand how much to take and what form to take. Work with your health care provider to find out whether it is safe   and beneficial for you to take aspirin daily. Ask your health care provider if you need to take a cholesterol-lowering medicine (statin). Find healthy ways to manage stress, such  as: Meditation, yoga, or listening to music. Journaling. Talking to a trusted person. Spending time with friends and family. Safety Always wear your seat belt while driving or riding in a vehicle. Do not drive: If you have been drinking alcohol. Do not ride with someone who has been drinking. When you are tired or distracted. While texting. If you have been using any mind-altering substances or drugs. Wear a helmet and other protective equipment during sports activities. If you have firearms in your house, make sure you follow all gun safety procedures. Minimize exposure to UV radiation to reduce your risk of skin cancer. What's next? Visit your health care provider once a year for an annual wellness visit. Ask your health care provider how often you should have your eyes and teeth checked. Stay up to date on all vaccines. This information is not intended to replace advice given to you by your health care provider. Make sure you discuss any questions you have with your health care provider. Document Revised: 03/19/2021 Document Reviewed: 03/19/2021 Elsevier Patient Education  2023 [employer]  

## 2022-03-04 NOTE — Progress Notes (Signed)
MEDICARE ANNUAL WELLNESS  Assessment:   1. Encounter for Medicare annual wellness exam Due annually  2. Right-sided low back pain without sciatica, unspecified chronicity Discussed possible nephrolithiasis -  no prior hx. Note:  Cyst of LEFT kidney 5.5 CM on U/S 10/2021-repeat in 1 year to check stability Will continue to monitor.   - Urinalysis w microscopic + reflex cultur  3. Essential hypertension Controlled and at goal. Continue medications; Losartan/Potassium (Hyzaar) Discussed DASH (Dietary Approaches to Stop Hypertension) DASH diet is lower in sodium than a typical American diet. Cut back on foods that are high in saturated fat, cholesterol, and trans fats. Eat more whole-grain foods, fish, poultry, and nuts Remain active and exercise as tolerated daily.  Monitor BP at home-Call if greater than 130/80.    4. Type 2 diabetes mellitus with stage 1 chronic kidney disease, without long-term current use of insulin (HCC) Discontinue Metformin per patient request. Jardiance 10 mg samples provided. Continue to monitor BG.   Aim for BG 100-150. Education: Reviewed 'ABCs' of diabetes management  A1C (<7):  Goal Unmet. Blood pressure (<130/80):  Goal Met Cholesterol (LDL <70):  Goal Met Continue Eye Exam yearly  Discussed dietary recommendations Discussed Physical Activity recommendations Foot exam UTD   5. Hyperlipidemia associated with type 2 diabetes mellitus (HCC) Uncontrolled.  Elevated Triglycerides, low HDL Continue medications; Rosuvastatin. Discussed lifestyle modifications. Recommended diet heavy in fruits and veggies, omega 3's. Decrease consumption of animal meats, cheeses, and dairy products. Remain active and exercise as tolerated. Continue to monitor.   7. Cyst of left kidney 5.5 CM on U/S 10/2021 repeat in 1 year to check stability Continue to repeat US 10/2022 for further review and evaluation. Continue to monitor.    8. Vitamin D deficiency Continue  Supplement. Continue to monitor.  9. Morbid obesity ([place]) Discussed appropriate BMI Goal of losing 1 lb per month. Diet modification. Physical activity. Encouraged/praised to build confidence.  10. Chronic gout of multiple sites, unspecified cause No recent flare. Continue Allopurinol. Discussed low purine diet. Continue to monitor.  11. Elevated LFTs Trending down. Continue to avoid alcohol, fatty, greasy foods. Remain active and keep weight controlled. Continue to monitor.   12. Medication management All medications reviewed in detail. All questions and concerns addressed.   Over 30 minutes of exam, counseling, chart review, and critical decision making was performed  Future Appointments  Date Time Provider [place]  04/08/2022  9:30 AM Darrol Jump, NP [facility] None  06/04/2022 10:00 AM Unk Pinto, MD [facility] None  03/05/2023 10:00 AM Darrol Jump, NP [facility] None     Plan:   During the course of the visit the patient was educated and counseled about appropriate screening and preventive services including:   Pneumococcal vaccine  Influenza vaccine Prevnar 13 Td vaccine Screening electrocardiogram Colorectal cancer screening Diabetes screening Glaucoma screening Nutrition counseling    Subjective:  Roger Mcclain is a 71 y.o. male who presents for Medicare Annual Wellness Visit and 3 month follow up for HTN, hyperlipidemia, T2 diabetes, and vitamin D Def.   Reports increase in right flank pain that woke him from sleep last night.  He was unable to get comfortable or lay back down.  Associated diaphoresis.  Thought it was his sugar  (hypoglycemia) but when checked it was 106.  He used a heating pad for relief and pain subsided several hours later.  He no longer has pain today but feels "fuzzy" in the head. He denies fever, chills, N/V.  He denies  any changes to urinary pattern.  Pain did not radiate to RLQ/groin.   He is trying to stay  well hydrated. Denies hx of nephrolithiasis.   He has ED and is on Cilias.  Currently effective.  BMI is Body mass index is 32.12 kg/m., he continues to walk 1 mile 3 days a week. 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 Has lost 2 lb in 2 mo. Wt Readings from Last 3 Encounters:  03/04/22 250 lb 3.2 oz (113.5 kg)  12/31/21 252 lb 3.2 oz (114.4 kg)  10/02/21 276 lb 6.4 oz (125.4 kg)   His blood pressure has been controlled at home, today their BP is BP: 130/90 He does workout, does house and yard work since the pandemic. He denies chest pain, shortness of breath, dizziness.   He is on cholesterol medication (rosuvastatin 40 mg daily) and denies myalgias, mom had history of CAD unknown age, nonsmoker. His cholesterol is not at goal with elevation in Triglycerides and low HDL. The cholesterol last visit was:   Lab Results  Component Value Date   CHOL 182 12/31/2021   HDL 36 (L) 12/31/2021   LDLCALC 114 (H) 12/31/2021   TRIG 208 (H) 12/31/2021   CHOLHDL 5.1 (H) 12/31/2021   He has been working on diet and exercise for diabetes - treated by metformin 1000 mg BID.  He has reduced his dose to 500 mg daily.  He no longer wants to continue Metformin b/c of the SE and what he has "researched" about long term effects.   He denies paresthesia of the feet, polydipsia, polyuria and visual disturbances. Last A1C in the office was:  Lab Results  Component Value Date   HGBA1C 12.2 (H) 12/31/2021   With CKD I/II associated with T2DM on losartan/hyzaar. Has had a recent US of the left kidney that revealed a cyst at 5.5 CM.  He is to re-screen with [place] in 1 year (10/2022). Last GFR Lab Results  Component Value Date   GFRNONAA 89 02/28/2021    Patient is on Vitamin D supplement.   Lab Results  Component Value Date   VD25OH 73 12/31/2021     Patient is on allopurinol for gout and does not report a recent flare.  Lab Results  Component Value Date   LABURIC 4.1 12/31/2021   He has  had persistent LFT elevation, however, trending down.  He had neg hep C. Lab Results  Component Value Date   ALT 48 (H) 12/31/2021   AST 39 (H) 12/31/2021   ALKPHOS 49 12/24/2016   BILITOT 0.5 12/31/2021     Medication Review: Current Outpatient Medications on File Prior to Visit  Medication Sig Dispense Refill   Accu-Chek Softclix Lancets lancets Use once daily with Accu-Chek meter to check BS reading. Dx. E11.9 100 each 12   allopurinol (ZYLOPRIM) 300 MG tablet Take 1 tablet daily to prevent Gout Attack 90 tablet 3   Ascorbic Acid (VITAMIN C) 1000 MG tablet Take 1,000 mg by mouth daily.     Blood Glucose Monitoring Suppl (ACCU-CHEK AVIVA PLUS) w/Device KIT Use daily to check BS.  Dx. E11.9 1 kit 0   diphenhydramine-acetaminophen (TYLENOL PM) 25-500 MG TABS tablet Take 1 tablet by mouth at bedtime as needed.     fenofibrate micronized (LOFIBRA) 134 MG capsule Take 1 capsule daily for Triglycerides (Blood Fats) 90 capsule 3   gabapentin (NEURONTIN) 300 MG capsule Take  2 capsules  at Bedtime for Sleep  180 capsule 3   glucose blood test strip Use once daily with Accu-Chek meter to check BS. Dx. E11.9 100 each 12   hydrOXYzine (ATARAX) 50 MG tablet Take  1 to 2 tablets  1 or 2 hours  before Bedtime  as needed  for Sleep 180 tablet 0   losartan-hydrochlorothiazide (HYZAAR) 100-25 MG tablet Take one tablet daily for blood pressure. 90 tablet 3   Multiple Vitamin (MULTIVITAMIN) tablet Take 1 tablet by mouth daily.     rosuvastatin (CRESTOR) 40 MG tablet Take 1 tablet daily for Cholesterol. 90 tablet 3   tadalafil (CIALIS) 20 MG tablet Take 1/2 to 1 tablet every 2 to 3 days as needed for XXXX 30 tablet 2   Vitamin D, Ergocalciferol, (DRISDOL) 1.25 MG (50000 UNIT) CAPS capsule Take 50,000 Units by mouth. Takes 1 capsule every other day     zinc gluconate 50 MG tablet Take 50 mg by mouth daily.     metFORMIN (GLUCOPHAGE-XR) 500 MG 24 hr tablet TAKE  2 TABLETS 2 TIMES A DAY WITH MEALS FOR DIABETES  (Patient not taking: Reported on 03/04/2022) 360 tablet 0   traZODone (DESYREL) 50 MG tablet Take 1 to 3 tablets 1 hour before Bedtime as needed for Sleep (Patient not taking: Reported on 12/31/2021) 270 tablet 1   No current facility-administered medications on file prior to visit.    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  Screening Tests Immunization History  Administered Date(s) Administered   Influenza, High Dose Seasonal PF 06/24/2017, 07/21/2018, 08/17/2019, 08/26/2020, 10/02/2021   PFIZER(Purple Top)SARS-COV-2 Vaccination 12/27/2019, 01/17/2020   Pneumococcal Conjugate-13 04/18/2018   Pneumococcal Polysaccharide-23 05/16/2019   Tdap 10/14/2015    Preventative care: Last colonoscopy:  COLOGUARD- 08/2018 due 08/2021 CT head 2001 MRI lumbar 2000  Prior vaccinations: TD or Tdap: 2017  Influenza: 2022  Pneumococcal: 2020 Prevnar13: 2019 Shingles/Zostavax: Declines.  Education provided. COVID: 2/2 pfizer   Names of Other Physician/Practitioners you currently use: 1. [facility] here for primary care 2. [facility] 2022, report requested 3. Dental, last remote, upper dentures, encouraged follow up for bottom   Patient Care Team: Unk Pinto, MD as PCP - General (Internal Medicine)  Surgical: He  has no past surgical history on file. Family His family history includes Diabetes in his father; Heart disease in his mother; Hyperlipidemia in his father; Hypertension in his father and mother.  Unknown age Social history  He reports that he  has never smoked. He has never used smokeless tobacco. He reports current alcohol use. He reports that he does not use drugs.  MEDICARE WELLNESS OBJECTIVES: Physical activity: Exercise limited by: None identified Cardiac risk factors: Cardiac Risk Factors include: advanced age (>67mn, >>73women);male gender;obesity (BMI >30kg/m2);hypertension;dyslipidemia Depression/mood screen:      03/04/2022   12:18 PM  Depression screen PHQ 2/9  Decreased Interest 0  Down, Depressed, Hopeless 0  PHQ - 2 Score 0    ADLs:     03/04/2022   12:17 PM 12/30/2021   10:11 PM  In your present state of health, do you have any difficulty performing the following  activities:  Hearing? 0 0  Vision? 0 0  Difficulty concentrating or making decisions? 0 0  Walking or climbing stairs? 0 0  Dressing or bathing? 0 0  Doing errands, shopping? 0 0  Preparing Food and eating ? N   Using the Toilet? N   In the past six months, have you accidently leaked urine? N   Do you have problems with loss of bowel control? N   Managing your Medications? N   Managing your Finances? N   Housekeeping or managing your Housekeeping? N      Cognitive Testing  Alert? Yes  Normal Appearance?Yes  Oriented to person? Yes  Place? Yes   Time? Yes  Recall of three objects?  Yes  Can perform simple calculations? Yes  Displays appropriate judgment?Yes  Can read the correct time from a watch face?Yes  EOL planning: Does Patient Have a Medical Advance Directive?: Yes Type of Advance Directive: Living will Does patient want to make changes to medical advance directive?: No - Patient declined   Objective:   Today's Vitals   03/04/22 1059  BP: 130/90  Pulse: 72  Resp: 16  Temp: (!) 96.6 F (35.9 C)  SpO2: 97%  Weight: 250 lb 3.2 oz (113.5 kg)   Body mass index is 32.12 kg/m.  General appearance: alert, no distress, WD/WN, male HEENT: normocephalic, sclerae anicteric, TMs pearly, nares patent, no discharge or erythema,  pharynx normal Oral cavity: MMM, no lesions Neck: supple, no lymphadenopathy, no thyromegaly, no masses Heart: RRR, normal S1, S2, no murmurs Lungs: CTA bilaterally, no wheezes, rhonchi, or rales Abdomen: +bs, soft, non tender, non distended, no masses, no hepatomegaly, no splenomegaly Musculoskeletal: nontender, no swelling, no obvious deformity Extremities: no edema, no cyanosis, no clubbing Pulses: 2+ symmetric, upper and lower extremities, normal cap refill Neurological: alert, oriented x 3, CN2-12 intact, strength normal upper extremities and lower extremities, sensation normal throughout, DTRs 2+ throughout, no cerebellar signs, gait normal Psychiatric: normal affect, behavior normal, pleasant   Medicare Attestation I have personally reviewed: The patient's medical and social history Their use of alcohol, tobacco or illicit drugs Their current medications and supplements The patient's functional ability including ADLs,fall risks, home safety risks, cognitive, and hearing and visual impairment Diet and physical activities Evidence for depression or mood disorders  The patient's weight, height, BMI, and visual acuity have been recorded in the chart.  I have made referrals, counseling, and provided education to the patient based on review of the above and I have provided the patient with a written personalized care plan for preventive services.     Darrol Jump, NP   03/04/2022

## 2022-03-05 ENCOUNTER — Ambulatory Visit
Admission: EM | Admit: 2022-03-05 | Discharge: 2022-03-05 | Disposition: A | Discharge status: Home / Self Care | Payer: Medicare HMO | Attending: Family Medicine | Admitting: Family Medicine

## 2022-03-05 ENCOUNTER — Telehealth: Payer: Self-pay | Admitting: Nurse Practitioner

## 2022-03-05 DIAGNOSIS — R310 Gross hematuria: Secondary | ICD-10-CM | POA: Diagnosis not present

## 2022-03-05 LAB — URINALYSIS W MICROSCOPIC + REFLEX CULTURE
Bacteria, UA: NONE SEEN /HPF
Bilirubin Urine: NEGATIVE
Glucose, UA: NEGATIVE
Hyaline Cast: NONE SEEN /LPF
Ketones, ur: NEGATIVE
Leukocyte Esterase: NEGATIVE
Nitrites, Initial: NEGATIVE
Protein, ur: NEGATIVE
Specific Gravity, Urine: 1.012 (ref 1.001–1.035)
Squamous Epithelial / HPF: NONE SEEN /HPF (ref <=5)
pH: 5.5 (ref 5.0–8.0)

## 2022-03-05 LAB — POCT URINALYSIS DIP (MANUAL ENTRY)
Glucose, UA: 100 mg/dL — AB
Nitrite, UA: POSITIVE — AB
Protein Ur, POC: >=300 mg/dL — AB
Spec Grav, UA: 1.02 (ref 1.010–1.025)
Urobilinogen, UA: 2 E.U./dL — AB
pH, UA: 6.5 (ref 5.0–8.0)

## 2022-03-05 LAB — NO CULTURE INDICATED

## 2022-03-05 MED ORDER — KETOROLAC TROMETHAMINE 30 MG/ML IJ SOLN
30.0000 mg | Freq: Once | INTRAMUSCULAR | Status: AC
  Administered 2022-03-05: 30 mg via INTRAMUSCULAR

## 2022-03-05 MED ORDER — LEVOFLOXACIN 500 MG PO TABS: 500.0000 mg | ORAL_TABLET | Freq: Every day | ORAL | 0 refills | Status: DC

## 2022-03-05 MED ORDER — TRAMADOL HCL 50 MG PO TABS: 50.0000 mg | ORAL_TABLET | Freq: Four times a day (QID) | ORAL | 0 refills | Status: DC | PRN

## 2022-03-05 MED ORDER — TAMSULOSIN HCL 0.4 MG PO CAPS: 0.4000 mg | ORAL_CAPSULE | Freq: Every day | ORAL | 0 refills | Status: DC

## 2022-03-05 MED ORDER — TRAMADOL HCL 50 MG PO TABS
50.0000 mg | ORAL_TABLET | Freq: Four times a day (QID) | ORAL | 0 refills | Status: DC | PRN
Start: 2022-03-05 — End: 2022-03-05

## 2022-03-05 NOTE — Telephone Encounter (Signed)
Please call patient, urine result was negative from yesterday except for blood. If symptoms have not improved add him on tomorrow

## 2022-03-05 NOTE — Discharge Instructions (Addendum)
Urinalysis shows white blood cells, red blood cells and nitrites.  This could be due to infection in your urinary system, and also could be due to renal stones  You have been given a shot of Toradol 30 mg today.  Take levofloxacin 500 mg--1 daily for 7 days.  This is for the possible infection  Take tamsulosin 0.4 mg 1 daily every evening--this is for urinary flow to help if there is a possible stone  Take tramadol 50 mg--1 every 6 hours as needed for pain.  This 1 can make you dizzy or sleepy

## 2022-03-05 NOTE — Telephone Encounter (Signed)
Pt said he say Tonya yesterday but that today his kidney is acting up again and it burns when he is urinating.

## 2022-03-05 NOTE — ED Provider Notes (Signed)
[facility]    CSN: [id number] Arrival date & time: 03/05/22  1741      History   Chief Complaint Chief Complaint  Patient presents with   Dysuria    HPI Roger Mcclain is a 71 y.o. male.    Dysuria Presenting symptoms: dysuria   Here for hematuria and dysuria that began abruptly this afternoon.  2 days ago he had some right flank pain that went away and then has come back.  He has never had renal stones or such symptoms previously.  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     Home Medications    Prior to Admission medications   Medication Sig Start Date End Date Taking? Authorizing Provider  levofloxacin (LEVAQUIN) 500 MG tablet Take 1 tablet (500 mg total) by mouth daily. 03/05/22  Yes Barrett Henle, MD  tamsulosin (FLOMAX) 0.4 MG CAPS capsule Take 1 capsule (0.4 mg total) by mouth daily. 03/05/22  Yes Niveah Boerner, Gwenlyn Perking, MD  traMADol (ULTRAM) 50 MG tablet Take 1 tablet (50 mg total) by mouth every 6 (six) hours as needed. 03/05/22  Yes Barrett Henle, MD  Accu-Chek Softclix Lancets lancets Use once daily with Accu-Chek meter to check BS reading. Dx. E11.9 01/15/22    Unk Pinto, MD  allopurinol (ZYLOPRIM) 300 MG tablet Take 1 tablet daily to prevent Gout Attack Patient not taking: Reported on 03/05/2022 08/08/20   Unk Pinto, MD  Ascorbic Acid (VITAMIN C) 1000 MG tablet Take 1,000 mg by mouth daily.    [provider]  Blood Glucose Monitoring Suppl (ACCU-CHEK AVIVA PLUS) w/Device KIT Use daily to check BS.  Dx. E11.9 01/15/22   Unk Pinto, MD  diphenhydramine-acetaminophen (TYLENOL PM) 25-500 MG TABS tablet Take 1 tablet by mouth at bedtime as needed.    [provider]  fenofibrate micronized (LOFIBRA) 134 MG capsule Take 1 capsule daily for Triglycerides (Blood Fats) 05/05/21   Liane Comber, NP  gabapentin (NEURONTIN) 300 MG capsule Take  2 capsules  at Bedtime for Sleep 06/10/21   Dani Gobble., MD  glucose blood test strip Use once daily with Accu-Chek meter to check BS. Dx. E11.9 01/15/22   Unk Pinto, MD  hydrOXYzine (ATARAX) 50 MG tablet Take  1 to 2 tablets  1 or 2 hours  before Bedtime  as needed  for Sleep 11/26/21   Unk Pinto, MD  losartan-hydrochlorothiazide (HYZAAR) 100-25 MG tablet Take one tablet daily for blood pressure. 05/05/21   Liane Comber, NP  metFORMIN (GLUCOPHAGE-XR) 500 MG 24 hr tablet TAKE  2 TABLETS 2 TIMES A DAY WITH MEALS FOR DIABETES  Patient not taking: Reported on 03/04/2022 07/08/21   Magda Bernheim, NP  Multiple Vitamin (MULTIVITAMIN) tablet Take 1 tablet by mouth daily.    [provider]  rosuvastatin (CRESTOR) 40 MG tablet Take 1 tablet daily for Cholesterol. 05/05/21   Liane Comber, NP  tadalafil (CIALIS) 20 MG tablet Take 1/2 to 1 tablet every 2 to 3 days as needed for XXXX 12/31/21   Unk Pinto, MD  traZODone (DESYREL) 50 MG tablet Take 1 to 3 tablets 1 hour before Bedtime as needed for Sleep Patient not taking: Reported on 12/31/2021 10/21/21   Unk Pinto, MD  Vitamin D, Ergocalciferol, (DRISDOL) 1.25 MG (50000 UNIT) CAPS capsule Take 50,000 Units by  mouth. Takes 1 capsule every other day    [provider]  zinc gluconate 50 MG tablet Take 50 mg by mouth daily.    [provider]    Family History Family History  Problem Relation Age of Onset   Hypertension Mother    Heart disease Mother    Diabetes Father    Hyperlipidemia Father    Hypertension Father     Social History Social History   Tobacco Use   Smoking status: Never   Smokeless tobacco: Never  Substance Use Topics   Alcohol use: Yes   Drug use: No     Allergies   Morphine   Review of Systems Review of Systems  Genitourinary:  Positive for dysuria.    Physical Exam Triage Vital Signs ED Triage Vitals  Enc Vitals Group     BP 03/05/22 1824 (!) 152/86     Pulse Rate 03/05/22 1821 84     Resp 03/05/22 1821 18     Temp 03/05/22 1821 98.3 F (36.8 C)     Temp src --      SpO2 03/05/22 1821 100 %     Weight --      Height --      Head Circumference --      Peak Flow --      Pain Score 03/05/22 1819 10     Pain Loc --      Pain Edu? --      Excl. in [place]? --    No data found.  Updated Vital Signs BP (!) 152/86   Pulse 84   Temp 98.3 F (36.8 C)   Resp 18   SpO2 100%   Visual Acuity Right Eye Distance:   Left Eye Distance:   Bilateral Distance:    Right Eye Near:   Left Eye Near:    Bilateral Near:     Physical Exam Vitals reviewed.  Constitutional:      General: He is not in acute distress.    Appearance: He is not ill-appearing, toxic-appearing or diaphoretic.  HENT:     Nose: Nose normal.     Mouth/Throat:     Mouth: Mucous membranes are moist.     Pharynx: No oropharyngeal exudate or posterior oropharyngeal erythema.  Eyes:     Extraocular Movements: Extraocular movements intact.     Conjunctiva/sclera: Conjunctivae normal.     Pupils: Pupils are equal, round, and reactive to light.  Cardiovascular:     Rate and Rhythm: Normal rate and regular rhythm.     Heart sounds: No murmur heard. Pulmonary:      Effort: Pulmonary effort is normal.     Breath sounds: Normal breath sounds.  Abdominal:     General: There is no distension.  Palpations: Abdomen is soft. There is no mass.     Tenderness: There is no abdominal tenderness. There is right CVA tenderness. There is no guarding.  Musculoskeletal:     Cervical back: Neck supple.  Lymphadenopathy:     Cervical: No cervical adenopathy.  Skin:    Capillary Refill: Capillary refill takes less than 2 seconds.     Coloration: Skin is not jaundiced or pale.  Neurological:     General: No focal deficit present.     Mental Status: He is alert and oriented to person, place, and time.  Psychiatric:        Behavior: Behavior normal.     UC Treatments / Results  Labs (all labs ordered are listed, but only abnormal results are displayed) Labs Reviewed  POCT URINALYSIS DIP (MANUAL ENTRY) - Abnormal; Notable for the following components:      Result Value   Color, UA red (*)    Clarity, UA hazy (*)    Glucose, UA =100 (*)    Bilirubin, UA moderate (*)    Ketones, POC UA trace (5) (*)    Blood, UA large (*)    Protein Ur, POC >=300 (*)    Urobilinogen, UA 2.0 (*)    Nitrite, UA Positive (*)    Leukocytes, UA Large (3+) (*)    All other components within normal limits  URINE CULTURE    EKG   Radiology No results found.  Procedures Procedures (including critical care time)  Medications Ordered in UC Medications  ketorolac (TORADOL) 30 MG/ML injection 30 mg (30 mg Intramuscular Given 03/05/22 1905)    Initial Impression / Assessment and Plan / UC Course  I have reviewed the triage vital signs and the nursing notes.  Pertinent labs & imaging results that were available during my care of the patient were reviewed by me and considered in my medical decision making (see chart for details).     Urinalysis shows leukocytes, nitrites, and blood.  There are some sugar.  Review of epic his last renal function was normal.  Apparently he  does have diabetes.  Regard to treat for possible urinary infection and possible renal stone.  He will see his primary doctor tomorrow  Tramadol sent for pain, he is okay on PMP Final Clinical Impressions(s) / UC Diagnoses   Final diagnoses:  Gross hematuria     Discharge Instructions      Urinalysis shows white blood cells, red blood cells and nitrites.  This could be due to infection in your urinary system, and also could be due to renal stones  You have been given a shot of Toradol 30 mg today.  Take levofloxacin 500 mg--1 daily for 7 days.  This is for the possible infection  Take tamsulosin 0.4 mg 1 daily every evening--this is for urinary flow to help if there is a possible stone  Take tramadol 50 mg--1 every 6 hours as needed for pain.  This 1 can make you dizzy or sleepy     ED Prescriptions     Medication Sig Dispense Auth. Provider   levofloxacin (LEVAQUIN) 500 MG tablet Take 1 tablet (500 mg total) by mouth daily. 7 tablet Elise Knobloch, Gwenlyn Perking, MD   tamsulosin (FLOMAX) 0.4 MG CAPS capsule Take 1 capsule (0.4 mg total) by mouth daily. 30 capsule Barrett Henle, MD   traMADol (ULTRAM) 50 MG tablet Take 1 tablet (50 mg total) by mouth every 6 (six) hours as needed. 10 tablet Juliane Poot  K, MD      I have reviewed the PDMP during this encounter.   Barrett Henle, MD 03/05/22 Pauline Aus

## 2022-03-05 NOTE — Progress Notes (Unsigned)
Assessment and Plan:  There are no diagnoses linked to this encounter.    Further disposition pending results of labs. Discussed med's effects and SE's.   Over 30 minutes of exam, counseling, chart review, and critical decision making was performed.   Future Appointments  Date Time Provider [place]  03/06/2022  9:15 AM Magda Bernheim, NP [facility] None  04/08/2022  9:30 AM Darrol Jump, NP [facility] None  06/04/2022 10:00 AM Unk Pinto, MD [facility] None  03/05/2023 10:00 AM Darrol Jump, NP [facility] None    ------------------------------------------------------------------------------------------------------------------   HPI There were no vitals taken for this visit. 71 y.o.male presents for  Past Medical History:  Diagnosis Date   Diabetes mellitus without complication ([place]) 6659   Fatty liver    Hyperlipidemia 2017   Hypertension 2010     No Known Allergies  Current Outpatient Medications on File Prior to Visit  Medication Sig   Accu-Chek Softclix Lancets lancets Use once daily with Accu-Chek meter to check BS reading. Dx. E11.9   allopurinol (ZYLOPRIM) 300 MG tablet Take 1 tablet daily to prevent Gout Attack   Ascorbic Acid (VITAMIN C) 1000 MG tablet Take 1,000 mg by mouth daily.   Blood Glucose Monitoring Suppl (ACCU-CHEK AVIVA PLUS) w/Device KIT Use daily to check BS.  Dx. E11.9   diphenhydramine-acetaminophen (TYLENOL PM) 25-500 MG TABS tablet Take 1 tablet by mouth at bedtime as needed.   fenofibrate micronized (LOFIBRA) 134 MG capsule Take 1 capsule daily for Triglycerides (Blood Fats)   gabapentin (NEURONTIN) 300 MG capsule Take  2 capsules  at Bedtime for Sleep   glucose blood test strip Use once daily with Accu-Chek meter to check BS. Dx. E11.9   hydrOXYzine (ATARAX) 50 MG tablet Take  1 to 2 tablets  1 or 2 hours  before Bedtime  as needed  for Sleep   losartan-hydrochlorothiazide (HYZAAR) 100-25 MG tablet Take one tablet daily for blood  pressure.   metFORMIN (GLUCOPHAGE-XR) 500 MG 24 hr tablet TAKE  2 TABLETS 2 TIMES A DAY WITH MEALS FOR DIABETES (Patient not taking: Reported on 03/04/2022)   Multiple Vitamin (MULTIVITAMIN) tablet Take 1 tablet by mouth daily.   rosuvastatin (CRESTOR) 40 MG tablet Take 1 tablet daily for Cholesterol.   tadalafil (CIALIS) 20 MG tablet Take 1/2 to 1 tablet every 2 to 3 days as needed for XXXX   traZODone (DESYREL) 50 MG tablet Take 1 to 3 tablets 1 hour before Bedtime as needed for Sleep (Patient not taking: Reported on 12/31/2021)   Vitamin D, Ergocalciferol, (DRISDOL) 1.25 MG (50000 UNIT) CAPS capsule Take 50,000 Units by mouth. Takes 1 capsule every other day   zinc gluconate 50 MG tablet Take 50 mg by mouth daily.   No current facility-administered medications on file prior to visit.    ROS: all negative except above.   Physical Exam:  There were no vitals taken for this visit.  General Appearance: Well nourished, in no apparent distress. Eyes: PERRLA, EOMs, conjunctiva no swelling or erythema Sinuses: No Frontal/maxillary tenderness ENT/Mouth: Ext aud canals clear, TMs without erythema, bulging. No erythema, swelling, or exudate on post pharynx.  Tonsils not swollen or erythematous. Hearing normal.  Neck: Supple, thyroid normal.  Respiratory: Respiratory effort normal, BS equal bilaterally without rales, rhonchi, wheezing or stridor.  Cardio: RRR with no MRGs. Brisk peripheral pulses without edema.  Abdomen: Soft, + BS.  Non tender, no guarding, rebound, hernias, masses. Lymphatics: Non tender without lymphadenopathy.  Musculoskeletal: Full ROM, 5/5 strength,  normal gait.  Skin: Warm, dry without rashes, lesions, ecchymosis.  Neuro: Cranial nerves intact. Normal muscle tone, no cerebellar symptoms. Sensation intact.  Psych: Awake and oriented X 3, normal affect, Insight and Judgment appropriate.     Magda Bernheim, NP 4:48 PM [facility]

## 2022-03-05 NOTE — ED Triage Notes (Signed)
Patient presents to Urgent Care with complaints of hematuria and dysuria since this morning. Patient reports no otc medications, pt reports earlier this week he did have lower back pain.

## 2022-03-06 ENCOUNTER — Ambulatory Visit (INDEPENDENT_AMBULATORY_CARE_PROVIDER_SITE_OTHER): Payer: Medicare HMO | Admitting: Nurse Practitioner

## 2022-03-06 ENCOUNTER — Encounter: Payer: Self-pay | Admitting: Nurse Practitioner

## 2022-03-06 VITALS — BP 118/70 | HR 71 | Temp 97.9°F | Resp 16 | Ht 74.0 in | Wt 252.0 lb

## 2022-03-06 DIAGNOSIS — R109 Unspecified abdominal pain: Secondary | ICD-10-CM | POA: Diagnosis not present

## 2022-03-06 DIAGNOSIS — R300 Dysuria: Secondary | ICD-10-CM

## 2022-03-07 LAB — URINE CULTURE: Culture: NO GROWTH

## 2022-03-09 ENCOUNTER — Other Ambulatory Visit: Payer: Self-pay | Admitting: Adult Health

## 2022-03-09 ENCOUNTER — Other Ambulatory Visit: Payer: Self-pay | Admitting: Internal Medicine

## 2022-03-09 DIAGNOSIS — I10 Essential (primary) hypertension: Secondary | ICD-10-CM

## 2022-03-09 DIAGNOSIS — E782 Mixed hyperlipidemia: Secondary | ICD-10-CM

## 2022-03-16 ENCOUNTER — Other Ambulatory Visit: Payer: Self-pay | Admitting: Adult Health

## 2022-03-16 DIAGNOSIS — E782 Mixed hyperlipidemia: Secondary | ICD-10-CM

## 2022-03-17 ENCOUNTER — Other Ambulatory Visit: Payer: Self-pay | Admitting: Internal Medicine

## 2022-03-17 DIAGNOSIS — E1122 Type 2 diabetes mellitus with diabetic chronic kidney disease: Secondary | ICD-10-CM

## 2022-03-17 MED ORDER — GLUCOSE BLOOD VI STRP: ORAL_STRIP | 3 refills | Status: AC

## 2022-03-19 DIAGNOSIS — H5203 Hypermetropia, bilateral: Secondary | ICD-10-CM | POA: Diagnosis not present

## 2022-03-27 DIAGNOSIS — H2513 Age-related nuclear cataract, bilateral: Secondary | ICD-10-CM | POA: Diagnosis not present

## 2022-03-27 DIAGNOSIS — H2511 Age-related nuclear cataract, right eye: Secondary | ICD-10-CM | POA: Diagnosis not present

## 2022-03-27 DIAGNOSIS — H401132 Primary open-angle glaucoma, bilateral, moderate stage: Secondary | ICD-10-CM | POA: Diagnosis not present

## 2022-04-08 ENCOUNTER — Ambulatory Visit: Payer: Medicare HMO | Admitting: Nurse Practitioner

## 2022-04-10 ENCOUNTER — Encounter: Payer: Self-pay | Admitting: Nurse Practitioner

## 2022-04-10 ENCOUNTER — Ambulatory Visit (INDEPENDENT_AMBULATORY_CARE_PROVIDER_SITE_OTHER): Payer: Medicare HMO | Admitting: Nurse Practitioner

## 2022-04-10 VITALS — BP 128/74 | HR 57 | Temp 97.3°F | Wt 242.0 lb

## 2022-04-10 DIAGNOSIS — E785 Hyperlipidemia, unspecified: Secondary | ICD-10-CM | POA: Diagnosis not present

## 2022-04-10 DIAGNOSIS — N3001 Acute cystitis with hematuria: Secondary | ICD-10-CM

## 2022-04-10 DIAGNOSIS — E1169 Type 2 diabetes mellitus with other specified complication: Secondary | ICD-10-CM | POA: Diagnosis not present

## 2022-04-10 DIAGNOSIS — E1122 Type 2 diabetes mellitus with diabetic chronic kidney disease: Secondary | ICD-10-CM | POA: Diagnosis not present

## 2022-04-10 DIAGNOSIS — H2511 Age-related nuclear cataract, right eye: Secondary | ICD-10-CM | POA: Diagnosis not present

## 2022-04-10 DIAGNOSIS — H401132 Primary open-angle glaucoma, bilateral, moderate stage: Secondary | ICD-10-CM | POA: Diagnosis not present

## 2022-04-10 DIAGNOSIS — I10 Essential (primary) hypertension: Secondary | ICD-10-CM

## 2022-04-10 DIAGNOSIS — N181 Chronic kidney disease, stage 1: Secondary | ICD-10-CM

## 2022-04-10 DIAGNOSIS — H2513 Age-related nuclear cataract, bilateral: Secondary | ICD-10-CM | POA: Diagnosis not present

## 2022-04-10 NOTE — Patient Instructions (Signed)

## 2022-04-10 NOTE — Progress Notes (Signed)
Assessment and Plan:  Roger Mcclain was seen today for a follow up.  Diagnoses and all order for this visit:  1. Acute cystitis with hematuria Resolved. Continue to stay well hydrated.  2. Essential hypertension Controlled  Continue medications;  Discussed DASH (Dietary Approaches to Stop Hypertension) DASH diet is lower in sodium than a typical American diet. Cut back on foods that are high in saturated fat, cholesterol, and trans fats. Eat more whole-grain foods, fish, poultry, and nuts Remain active and exercise as tolerated daily.  Monitor BP at home-Call if greater than 130/80.   - CBC with Differential/Platelet - COMPLETE METABOLIC PANEL WITH GFR  3. Type 2 diabetes mellitus with stage 1 chronic kidney disease, without long-term current use of insulin [facility]) Education: Reviewed 'ABCs' of diabetes management  Goal of A1C (<7) Goal of Blood pressure (<130/80) Goal of Cholesterol (LDL <70) Continue Eye Exam yearly  Continue Dental Exam Q6 mo Discussed dietary recommendations Discussed Physical Activity recommendations Foot exam UTD Discussed how what you eat and drink can aide in kidney protection. Stay well hydrated. Avoid high salt foods. Avoid NSAIDS. Keep BP and BG well controlled.   Take medications as prescribed. Remain active and exercise as tolerated daily. Maintain weight.  Continue to monitor.  - COMPLETE METABOLIC PANEL WITH GFR - Hemoglobin A1c  4. Hyperlipidemia associated with type 2 diabetes mellitus (HCC) Controlled Continue medications;  Discussed lifestyle modifications. Recommended diet heavy in fruits and veggies, omega 3's. Decrease consumption of animal meats, cheeses, and dairy products. Remain active and exercise as tolerated. Continue to monitor.  - Lipid panel  5. CKD stage 1 due to type 2 diabetes mellitus ([place]) Discussed how what you eat and drink can aide in kidney protection. Stay well hydrated. Avoid high salt  foods. Avoid NSAIDS. Keep BP and BG well controlled.   Take medications as prescribed. Remain active and exercise as tolerated daily. Maintain weight.  Continue to monitor.  - COMPLETE METABOLIC PANEL WITH GFR  6. Morbid obesity ([place]) Discussed appropriate BMI Goal of losing 1 lb per month. Diet modification. Physical activity. Encouraged/praised to build confidence.  - Lipid panel - Hemoglobin A1c     Continue to monitor for any increase in fever, chills, N/V, diarrhea, changes to bowel habits, blood in stool.  Notify office for further evaluation and treatment, questions or concerns if s/s fail to improve. The risks and benefits of my recommendations, as well as other treatment options were discussed with the patient today. Questions were answered.  Further disposition pending results of labs. Discussed med's effects and SE's.    Over 20 minutes of exam, counseling, chart review, and critical decision making was performed.   Future Appointments  Date Time Provider [place]  06/04/2022 10:00 AM Unk Pinto, MD [facility] None  03/05/2023 10:00 AM Darrol Jump, NP [facility] None    ------------------------------------------------------------------------------------------------------------------   HPI BP 128/74   Pulse (!) 57   Temp (!) 97.3 F (36.3 C)   Wt 242 lb (109.8 kg)   SpO2 99%   BMI 31.07 kg/m   71 y.o.male presents for one month follow up and evaluation of UTI and nephrolithiasis after being seen in the ER 03/05/22.  States he is no longer feeling any lower urinary tract symptoms, abdominal pain has subsided.  He denies dysuria, frequency, urgency, hematuria, fever, chills, N/V.   He is here to also evaluate A1C which has been highly elevated.  He has lost 10 lb in the last month.  He  has incorporated lifestyle modifications and is continuing exercises.    BMI is Body mass index is 31.07 kg/m., he has been working on diet and exercise. Wt  Readings from Last 3 Encounters:  04/10/22 242 lb (109.8 kg)  03/06/22 252 lb (114.3 kg)  03/04/22 250 lb 3.2 oz (113.5 kg)   He has been working on diet and exercise for diabetes, and denies polydipsia and polyuria. Last A1C in the office was:  Lab Results  Component Value Date   HGBA1C 12.2 (H) 12/31/2021    He is on cholesterol medication and denies myalgias. His cholesterol is not at goal. The cholesterol last visit was:   Lab Results  Component Value Date   CHOL 182 12/31/2021   HDL 36 (L) 12/31/2021   LDLCALC 114 (H) 12/31/2021   TRIG 208 (H) 12/31/2021   CHOLHDL 5.1 (H) 12/31/2021    Past Medical History:  Diagnosis Date   Diabetes mellitus without complication ([place]) 1941   Fatty liver    Hyperlipidemia 2017   Hypertension 2010     Allergies  Allergen Reactions   Morphine     Current Outpatient Medications on File Prior to Visit  Medication Sig   Accu-Chek Softclix Lancets lancets Use once daily with Accu-Chek meter to check BS reading. Dx. E11.9   Ascorbic Acid (VITAMIN C) 1000 MG tablet Take 1,000 mg by mouth daily.   Blood Glucose Monitoring Suppl (ACCU-CHEK AVIVA PLUS) w/Device KIT Use daily to check BS.  Dx. E11.9   diphenhydramine-acetaminophen (TYLENOL PM) 25-500 MG TABS tablet Take 1 tablet by mouth at bedtime as needed.   fenofibrate micronized (LOFIBRA) 134 MG capsule TAKE 1 CAPSULE DAILY FOR TRIGLYCERIDES (BLOOD FATS)   gabapentin (NEURONTIN) 300 MG capsule Take  2 capsules  at Bedtime for Sleep   glucose blood test strip Use 3 x/day before Meals with Accu-Chek meter to check BS.   ( Dx:  e11.9 )   hydrOXYzine (ATARAX) 50 MG tablet TAKE  1 TO 2 TABLETS  1 OR 2 HOURS  BEFORE BEDTIME  AS NEEDED  FOR SLEEP   losartan-hydrochlorothiazide (HYZAAR) 100-25 MG tablet TAKE 1 TABLET EVERY DAY FOR BLOOD PRESSURE   Multiple Vitamin (MULTIVITAMIN) tablet Take 1 tablet by mouth daily.   rosuvastatin (CRESTOR) 40 MG tablet TAKE 1 TABLET EVERY DAY FOR CHOLESTEROL    tadalafil (CIALIS) 20 MG tablet Take 1/2 to 1 tablet every 2 to 3 days as needed for XXXX   tamsulosin (FLOMAX) 0.4 MG CAPS capsule Take 1 capsule (0.4 mg total) by mouth daily.   traMADol (ULTRAM) 50 MG tablet Take 1 tablet (50 mg total) by mouth every 6 (six) hours as needed.   traZODone (DESYREL) 50 MG tablet Take 1 to 3 tablets 1 hour before Bedtime as needed for Sleep   Vitamin D, Ergocalciferol, (DRISDOL) 1.25 MG (50000 UNIT) CAPS capsule Take 50,000 Units by mouth. Takes 1 capsule every other day   zinc gluconate 50 MG tablet Take 50 mg by mouth daily.   allopurinol (ZYLOPRIM) 300 MG tablet Take 1 tablet daily to prevent Gout Attack (Patient not taking: Reported on 04/10/2022)   levofloxacin (LEVAQUIN) 500 MG tablet Take 1 tablet (500 mg total) by mouth daily.   metFORMIN (GLUCOPHAGE-XR) 500 MG 24 hr tablet TAKE  2 TABLETS 2 TIMES A DAY WITH MEALS FOR DIABETES (Patient not taking: Reported on 03/04/2022)   No current facility-administered medications on file prior to visit.    ROS: all negative except what is noted  in the HPI.     Physical Exam:  BP 128/74   Pulse (!) 57   Temp (!) 97.3 F (36.3 C)   Wt 242 lb (109.8 kg)   SpO2 99%   BMI 31.07 kg/m   General Appearance: NAD.  Awake, conversant and cooperative. Eyes: PERRLA, EOMs intact.  Sclera white.  Conjunctiva without erythema. Sinuses: No frontal/maxillary tenderness.  No nasal discharge. Nares patent.  ENT/Mouth: Ext aud canals clear.  Bilateral TMs w/DOL and without erythema or bulging. Hearing intact.  Posterior pharynx without swelling or exudate.  Tonsils without swelling or erythema.  Neck: Supple.  No masses, nodules or thyromegaly. Respiratory: Effort is regular with non-labored breathing. Breath sounds are equal bilaterally without rales, rhonchi, wheezing or stridor.  Cardio: RRR with no MRGs. Brisk peripheral pulses without edema.  Abdomen: Active BS in all four quadrants.  Soft and non-tender without guarding,  rebound tenderness, hernias or masses. Lymphatics: Non tender without lymphadenopathy.  Musculoskeletal: Full ROM, 5/5 strength, normal ambulation.  No clubbing or cyanosis. Skin: Appropriate color for ethnicity. Warm without rashes, lesions, ecchymosis, ulcers.  Neuro: CN II-XII grossly normal. Normal muscle tone without cerebellar symptoms and intact sensation.   Psych: AO X 3,  appropriate mood and affect, insight and judgment.     Darrol Jump, NP 10:42 AM [facility]

## 2022-04-11 LAB — COMPLETE METABOLIC PANEL WITH GFR
AG Ratio: 1.7 (calc) (ref 1.0–2.5)
ALT: 28 U/L (ref 9–46)
AST: 28 U/L (ref 10–35)
Albumin: 4.8 g/dL (ref 3.6–5.1)
Alkaline phosphatase (APISO): 48 U/L (ref 35–144)
BUN: 21 mg/dL (ref 7–25)
CO2: 25 mmol/L (ref 20–32)
Calcium: 10 mg/dL (ref 8.6–10.3)
Chloride: 102 mmol/L (ref 98–110)
Creat: 1.13 mg/dL (ref 0.70–1.28)
Globulin: 2.9 g/dL (calc) (ref 1.9–3.7)
Glucose, Bld: 86 mg/dL (ref 65–99)
Potassium: 4 mmol/L (ref 3.5–5.3)
Sodium: 138 mmol/L (ref 135–146)
Total Bilirubin: 0.6 mg/dL (ref 0.2–1.2)
Total Protein: 7.7 g/dL (ref 6.1–8.1)
eGFR: 70 mL/min/{1.73_m2} (ref >=60)

## 2022-04-11 LAB — CBC WITH DIFFERENTIAL/PLATELET
Absolute Monocytes: 491 cells/uL (ref 200–950)
Basophils Absolute: 50 cells/uL (ref 0–200)
Basophils Relative: 0.8 %
Eosinophils Absolute: 38 cells/uL (ref 15–500)
Eosinophils Relative: 0.6 %
HCT: 40.5 % (ref 38.5–50.0)
Hemoglobin: 13.5 g/dL (ref 13.2–17.1)
Lymphs Abs: 1455 cells/uL (ref 850–3900)
MCH: 30.5 pg (ref 27.0–33.0)
MCHC: 33.3 g/dL (ref 32.0–36.0)
MCV: 91.6 fL (ref 80.0–100.0)
MPV: 10.3 fL (ref 7.5–12.5)
Monocytes Relative: 7.8 %
Neutro Abs: 4265 cells/uL (ref 1500–7800)
Neutrophils Relative %: 67.7 %
Platelets: 252 10*3/uL (ref 140–400)
RBC: 4.42 10*6/uL (ref 4.20–5.80)
RDW: 12.5 % (ref 11.0–15.0)
Total Lymphocyte: 23.1 %
WBC: 6.3 10*3/uL (ref 3.8–10.8)

## 2022-04-11 LAB — HEMOGLOBIN A1C
Hgb A1c MFr Bld: 6.2 % of total Hgb — ABNORMAL HIGH (ref <5.7)
Mean Plasma Glucose: 131 mg/dL
eAG (mmol/L): 7.3 mmol/L

## 2022-04-11 LAB — LIPID PANEL
Cholesterol: 138 mg/dL (ref <200)
HDL: 36 mg/dL — ABNORMAL LOW (ref >=40)
LDL Cholesterol (Calc): 84 mg/dL (calc)
Non-HDL Cholesterol (Calc): 102 mg/dL (calc) (ref <130)
Total CHOL/HDL Ratio: 3.8 (calc) (ref <5.0)
Triglycerides: 89 mg/dL (ref <150)

## 2022-05-01 ENCOUNTER — Other Ambulatory Visit: Payer: Self-pay | Admitting: Nurse Practitioner

## 2022-05-01 ENCOUNTER — Telehealth: Payer: Self-pay | Admitting: Nurse Practitioner

## 2022-05-01 MED ORDER — EMPAGLIFLOZIN 10 MG PO TABS: 10.0000 mg | ORAL_TABLET | Freq: Every day | ORAL | 0 refills | Status: DC

## 2022-05-01 NOTE — Telephone Encounter (Signed)
Pt was given samples of jardiance 10 mg at last visit and is wanting a prescription sent into his Mail order pharmacy please

## 2022-05-18 ENCOUNTER — Ambulatory Visit
Admission: EM | Admit: 2022-05-18 | Discharge: 2022-05-18 | Disposition: A | Discharge status: Home / Self Care | Payer: Medicare HMO | Attending: Physician Assistant | Admitting: Physician Assistant

## 2022-05-18 DIAGNOSIS — J0100 Acute maxillary sinusitis, unspecified: Secondary | ICD-10-CM | POA: Diagnosis not present

## 2022-05-18 MED ORDER — AMOXICILLIN-POT CLAVULANATE 875-125 MG PO TABS: 1.0000 | ORAL_TABLET | Freq: Two times a day (BID) | ORAL | 0 refills | Status: DC

## 2022-05-18 NOTE — ED Provider Notes (Signed)
[facility]    CSN: [id number] Arrival date & time: 05/18/22  1534      History   Chief Complaint Chief Complaint  Patient presents with   Otalgia    HPI Roger Mcclain is a 71 y.o. male.   Patient here today for evaluation of left-sided ear pain that started last week.  He reports that he has constant sinus pressure to his left maxillary sinus.  He has been taking amoxicillin he had leftover without resolution of symptoms.  He denies any fever.  He denies any cough or chest congestion otherwise.  He has not had any nausea, vomiting or diarrhea.  The history is provided by the patient.    Past Medical History:  Diagnosis Date   Diabetes mellitus without complication ([place]) 8916   Fatty liver    Hyperlipidemia 2017   Hypertension 2010    Patient Active Problem List   Diagnosis Date Noted   Cyst of left kidney 5.5 CM on U/S 10/2021 repeat in 1 year to check stability 10/15/2021   History of COVID-19 10/31/2019   CKD stage 1 due to type 2 diabetes mellitus ([place]) 08/16/2019   Elevated LFTs 07/22/2018   Gout of multiple sites 07/20/2018   Morbid obesity ([place]) 10/01/2017   Hyperlipidemia associated with type 2 diabetes mellitus ([place]) 12/27/2016   Vitamin D deficiency 12/27/2016   Diabetes mellitus ([place]) 12/24/2016   Essential hypertension 12/24/2016    No past surgical history on file.     Home Medications    Prior to Admission medications   Medication Sig Start Date End Date Taking? Authorizing Provider  amoxicillin-clavulanate (AUGMENTIN) 875-125 MG tablet Take 1 tablet by mouth every 12 (twelve) hours. 05/18/22  Yes Francene Finders, PA-C  Accu-Chek Softclix Lancets lancets Use once daily with Accu-Chek meter to check BS reading. Dx. E11.9 01/15/22   Unk Pinto, MD  allopurinol (ZYLOPRIM) 300 MG tablet Take 1 tablet daily to prevent Gout Attack Patient not taking: Reported on 04/10/2022 08/08/20   Unk Pinto, MD  Ascorbic Acid (VITAMIN C)  1000 MG tablet Take 1,000 mg by mouth daily.    [provider]  Blood Glucose Monitoring Suppl (ACCU-CHEK AVIVA PLUS) w/Device KIT Use daily to check BS.  Dx. E11.9 01/15/22   Unk Pinto, MD  diphenhydramine-acetaminophen (TYLENOL PM) 25-500 MG TABS tablet Take 1 tablet by mouth at bedtime as needed.    [provider]  empagliflozin (JARDIANCE) 10 MG TABS tablet Take 1 tablet (10 mg total) by mouth daily before breakfast. 05/01/22   Darrol Jump, NP  fenofibrate micronized (LOFIBRA) 134 MG capsule TAKE 1 CAPSULE DAILY FOR TRIGLYCERIDES (BLOOD FATS) 03/09/22   Alycia Rossetti, NP  gabapentin (NEURONTIN) 300 MG capsule Take  2 capsules  at Bedtime for Sleep 06/10/21   Dani Gobble., MD  glucose blood test strip Use 3 x/day before Meals with Accu-Chek meter to check BS.   ( Dx:  e11.9 ) 03/17/22   Unk Pinto, MD  hydrOXYzine (ATARAX) 50 MG tablet TAKE  1 TO 2 TABLETS  1 OR 2 HOURS  BEFORE BEDTIME  AS NEEDED  FOR SLEEP 03/09/22   Alycia Rossetti, NP  levofloxacin (LEVAQUIN) 500 MG tablet Take 1 tablet (500 mg total) by mouth daily. 03/05/22   Barrett Henle, MD  losartan-hydrochlorothiazide (HYZAAR) 100-25 MG tablet TAKE 1 TABLET EVERY DAY FOR BLOOD PRESSURE 03/09/22   Alycia Rossetti, NP  metFORMIN (GLUCOPHAGE-XR) 500 MG 24 hr tablet TAKE  2 TABLETS 2 TIMES A DAY WITH MEALS FOR DIABETES Patient not taking: Reported on 03/04/2022 07/08/21   Alycia Rossetti, NP  Multiple Vitamin (MULTIVITAMIN) tablet Take 1 tablet by mouth daily.    [provider]  rosuvastatin (CRESTOR) 40 MG tablet TAKE 1 TABLET EVERY DAY FOR CHOLESTEROL 03/16/22   Darrol Jump, NP  tadalafil (CIALIS) 20 MG tablet Take 1/2 to 1 tablet every 2 to 3 days as needed for XXXX 12/31/21   Unk Pinto, MD  tamsulosin (FLOMAX) 0.4 MG CAPS capsule Take 1 capsule (0.4 mg total) by mouth daily. 03/05/22   Barrett Henle, MD  traMADol (ULTRAM) 50 MG tablet Take 1 tablet (50 mg total) by mouth  every 6 (six) hours as needed. 03/05/22   Barrett Henle, MD  traZODone (DESYREL) 50 MG tablet Take 1 to 3 tablets 1 hour before Bedtime as needed for Sleep 10/21/21   Unk Pinto, MD  Vitamin D, Ergocalciferol, (DRISDOL) 1.25 MG (50000 UNIT) CAPS capsule Take 50,000 Units by mouth. Takes 1 capsule every other day    [provider]  zinc gluconate 50 MG tablet Take 50 mg by mouth daily.    [provider]    Family History Family History  Problem Relation Age of Onset   Hypertension Mother    Heart disease Mother    Diabetes Father    Hyperlipidemia Father    Hypertension Father     Social History Social History   Tobacco Use   Smoking status: Never   Smokeless tobacco: Never  Substance Use Topics   Alcohol use: Yes   Drug use: No     Allergies   Morphine   Review of Systems Review of Systems  Constitutional:  Negative for chills and fever.  HENT:  Positive for congestion, ear pain and sinus pressure. Negative for sore throat.   Eyes:  Negative for discharge and redness.  Respiratory:  Negative for cough and shortness of breath.   Gastrointestinal:  Negative for abdominal pain, diarrhea, nausea and vomiting.     Physical Exam Triage Vital Signs ED Triage Vitals  Enc Vitals Group     BP      Pulse      Resp      Temp      Temp src      SpO2      Weight      Height      Head Circumference      Peak Flow      Pain Score      Pain Loc      Pain Edu?      Excl. in [place]?    No data found.  Updated Vital Signs BP 124/74   Pulse 97   Temp 98 F (36.7 C)   Resp 18   SpO2 94%      Physical Exam Vitals and nursing note reviewed.  Constitutional:      General: He is not in acute distress.    Appearance: He is well-developed. He is not ill-appearing.  HENT:     Head: Normocephalic and atraumatic.     Ears:     Comments: Bilateral Tms dull, left injected    Nose: Congestion present.     Mouth/Throat:     Mouth: Mucous  membranes are moist.     Pharynx: Posterior oropharyngeal erythema present. No oropharyngeal exudate.  Eyes:     Conjunctiva/sclera: Conjunctivae normal.  Cardiovascular:     Rate  and Rhythm: Normal rate.  Pulmonary:     Effort: Pulmonary effort is normal. No respiratory distress.  Skin:    General: Skin is warm and dry.  Neurological:     Mental Status: He is alert.  Psychiatric:        Mood and Affect: Mood normal.        Behavior: Behavior normal.      UC Treatments / Results  Labs (all labs ordered are listed, but only abnormal results are displayed) Labs Reviewed - No data to display  EKG   Radiology No results found.  Procedures Procedures (including critical care time)  Medications Ordered in UC Medications - No data to display  Initial Impression / Assessment and Plan / UC Course  I have reviewed the triage vital signs and the nursing notes.  Pertinent labs & imaging results that were available during my care of the patient were reviewed by me and considered in my medical decision making (see chart for details).   Augmentin prescribed to cover sinusitis as well as otitis media.  Recommended follow-up if no gradual improvement or if symptoms worsen.   Final Clinical Impressions(s) / UC Diagnoses   Final diagnoses:  Acute maxillary sinusitis, recurrence not specified   Discharge Instructions   None    ED Prescriptions     Medication Sig Dispense Auth. Provider   amoxicillin-clavulanate (AUGMENTIN) 875-125 MG tablet Take 1 tablet by mouth every 12 (twelve) hours. 14 tablet Francene Finders, PA-C      PDMP not reviewed this encounter.   Francene Finders, PA-C 05/18/22 1635

## 2022-05-18 NOTE — ED Triage Notes (Signed)
Patient presents to Urgent Care with complaints of otalgia since last week. Patient reports he took 5 doeses of amoxicillin he had left from sinus infection

## 2022-06-04 ENCOUNTER — Other Ambulatory Visit: Payer: Self-pay | Admitting: Internal Medicine

## 2022-06-04 ENCOUNTER — Encounter: Payer: Medicare HMO | Admitting: Internal Medicine

## 2022-06-04 DIAGNOSIS — Z79899 Other long term (current) drug therapy: Secondary | ICD-10-CM

## 2022-06-05 ENCOUNTER — Encounter: Payer: Medicare HMO | Admitting: Internal Medicine

## 2022-06-18 DIAGNOSIS — H401111 Primary open-angle glaucoma, right eye, mild stage: Secondary | ICD-10-CM | POA: Diagnosis not present

## 2022-06-18 DIAGNOSIS — H2511 Age-related nuclear cataract, right eye: Secondary | ICD-10-CM | POA: Diagnosis not present

## 2022-06-18 DIAGNOSIS — H2513 Age-related nuclear cataract, bilateral: Secondary | ICD-10-CM | POA: Diagnosis not present

## 2022-06-18 DIAGNOSIS — H401112 Primary open-angle glaucoma, right eye, moderate stage: Secondary | ICD-10-CM | POA: Diagnosis not present

## 2022-06-18 DIAGNOSIS — Z961 Presence of intraocular lens: Secondary | ICD-10-CM | POA: Diagnosis not present

## 2022-06-19 DIAGNOSIS — H2512 Age-related nuclear cataract, left eye: Secondary | ICD-10-CM | POA: Diagnosis not present

## 2022-06-19 DIAGNOSIS — Z961 Presence of intraocular lens: Secondary | ICD-10-CM | POA: Diagnosis not present

## 2022-06-25 NOTE — Patient Instructions (Signed)

## 2022-06-25 NOTE — Progress Notes (Signed)
Annual  Screening/Preventative Visit  & Comprehensive Evaluation & Examination   Future Appointments  Date Time Provider Department  06/26/2022 10:00 AM Unk Pinto, MD [facility]  03/05/2023 10:00 AM Darrol Jump, NP [facility]  07/01/2023 10:00 AM Unk Pinto, MD [facility]     Future Appointments  Date Time Provider Department  06/26/2022               cpe 10:00 AM Unk Pinto, MD [facility]  03/05/2023               wellness 10:00 AM Darrol Jump, NP [facility]  07/01/2023                cpe 10:00 AM Unk Pinto, MD [facility]              This very nice 71 y.o. MWM presents for a Screening /Preventative Visit & comprehensive evaluation and management of multiple medical co-morbidities.  Patient has been followed for HTN, HLD, T2_NIDDM  and Vitamin D Deficiency. Patient has Gout controlled on his meds.       HTN predates since 2010. Patient's BP has been controlled at home.  Today's BP is  \initially elevated & rechecked at at goal - 136/86  .  Patient denies any cardiac symptoms as chest pain, palpitations, shortness of breath, dizziness or ankle swelling.       Patient's hyperlipidemia is controlled with diet and Rosuvastatin /Fenofibrate. Patient denies myalgias or other medication SE's. 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   patient denies reactive hypoglycemic symptoms, visual blurring, diabetic polys or paresthesias. Last A1c was not at goal:   Lab Results  Component Value Date   HGBA1C 6.2 (H) 04/10/2022   Wt Readings from Last 3 Encounters:  06/26/22 241 lb 9.6 oz (109.6 kg)  04/10/22 242 lb (109.8 kg)  03/06/22 252 lb (114.3 kg)        Finally,  patient has history of Vitamin D Deficiency ("23" /2018) and last vitamin D was still low (goal 70-100):    Lab Results  Component Value Date   VD25OH 73 12/31/2021      Current Outpatient Medications  Medication Instructions   Allopurinol  300 MG tablet Take 1 tablet daily   TYLENOL PM   25-500 MG  1 tablet  At bedtime PRN   empagliflozin (JARDIANCE)  10 mg Daily before breakfast   fenofibrate  134 MG capsule TAKE 1 CAPSULE DAILY    gabapentin  300 MG capsule TAKE  2 CAPSULES  AT BEDTIME FOR SLEEP   hydrOXYzine 50 MG tablet TAKE  1-2 TABS  1-2 HRS  BEFORE BEDTIME  AS NEEDED  FOR SLEEP   losartan-hctz 100-25 MG tablet TAKE 1 TABLET EVERY DAY   Multiple Vitamin  1 tablet  Daily   rosuvastatin 40 MG tablet TAKE 1 TABLET EVERY DAY    tadalafil  20 MG tablet Take 1/2 to 1 tablet every 2 to 3 days as needed for XXXX   tamsulosin   0.4 mg  Daily   traMADol 50 mg Every 6 hours PRN   traZODone (DESYREL) 50  MG tablet Take 1 to 3 tablets 1 hour before Bedtime as needed   vitamin C  1,000 mg  Daily   Vitamin D 50,000 Units Takes 1 capsule every other day   Zinc 50 mg Daily    No Known Allergies   Past Medical History:  Diagnosis Date   Diabetes mellitus without complication ([place]) 3762   Fatty liver    Hyperlipidemia 2017   Hypertension 2010    Health Maintenance  Topic Date Due   OPHTHALMOLOGY EXAM  Never done   Zoster Vaccines- Shingrix (1 of 2) Never done   COVID-19 Vaccine (3 - Pfizer risk series) 02/14/2020   INFLUENZA VACCINE  05/05/2021   FOOT EXAM  05/22/2021   Fecal DNA (Cologuard)  08/16/2021   HEMOGLOBIN A1C  08/31/2021   TETANUS/TDAP  10/13/2025   Hepatitis C Screening  Completed   PNA vac Low Risk Adult  Completed   HPV VACCINES  Aged Out    Immunization History  Administered Date(s) Administered   Influenza, High Dose  07/21/2018, 08/17/2019, 08/26/2020   PFIZER-SARS-COV-2 Vacc 12/27/2019, 01/17/2020   Pneumococcal -13 04/18/2018   Pneumococcal -23  05/16/2019   Tdap 10/14/2015    - Cologard - 08/23/2018 - Negative - 3 year f/u due Nov 2022  No past surgical history on file.   Family History  Problem Relation Age of Onset   Hypertension Mother    Heart disease Mother    Diabetes Father    Hyperlipidemia Father    Hypertension Father     Social History   Socioeconomic History   Marital status: Married    Spouse name: Edd Fabian   Number of children: 1  daughter  Occupational History   Retired Administrator  Tobacco Use   Smoking status: Never   Smokeless tobacco: Never  Substance and Sexual Activity   Alcohol use: Yes   Drug use: No   Sexual activity: Yes    ROS Constitutional: Denies fever, chills, weight loss/gain, headaches, insomnia,  night sweats or change in appetite. Does c/o fatigue. Eyes: Denies redness, blurred vision, diplopia, discharge, itchy or watery eyes.  ENT: Denies discharge, congestion, post nasal drip, epistaxis, sore throat, earache, hearing loss, dental pain, Tinnitus, Vertigo, Sinus pain or snoring.  Cardio: Denies chest pain, palpitations, irregular heartbeat, syncope, dyspnea, diaphoresis, orthopnea, PND, claudication or edema Respiratory: denies cough, dyspnea, DOE, pleurisy, hoarseness, laryngitis or wheezing.  Gastrointestinal: Denies dysphagia, heartburn, reflux, water brash, pain, cramps, nausea, vomiting, bloating, diarrhea, constipation, hematemesis, melena, hematochezia, jaundice or hemorrhoids Genitourinary: Denies dysuria, frequency, urgency, nocturia, hesitancy, discharge, hematuria or flank pain Musculoskeletal: Denies arthralgia, myalgia, stiffness, Jt. Swelling, pain, limp or strain/sprain. Denies Falls. Skin: Denies puritis, rash, hives, warts, acne, eczema or change in skin lesion Neuro: No weakness, tremor, incoordination, spasms, paresthesia or pain Psychiatric: Denies confusion, memory loss or sensory loss. Denies Depression. Endocrine: Denies change in weight, skin, hair  change, nocturia, and paresthesia, diabetic polys, visual blurring or hyper / hypo glycemic episodes.  Heme/Lymph: No excessive bleeding, bruising or enlarged lymph nodes.   Physical Exam  BP (!) 142/98   Pulse 66   Temp 97.9 F (36.6 C)   Resp 16   Ht '6\' 2"'$  (1.88 m)   Wt 241 lb 9.6 oz (109.6 kg)   SpO2 99%   BMI 31.02 kg/m   General Appearance: over nourished , well groomed and in no apparent distress.  Eyes: PERRLA, EOMs, conjunctiva no swelling or erythema, normal fundi and vessels. Sinuses: No frontal/maxillary  tenderness ENT/Mouth: EACs patent / TMs  nl. Nares clear without erythema, swelling, mucoid exudates. Oral hygiene is good. No erythema, swelling, or exudate. Tongue normal, non-obstructing. Tonsils not swollen or erythematous. Hearing normal.  Neck: Supple, thyroid not palpable. No bruits, nodes or JVD. Respiratory: Respiratory effort normal.  BS equal and clear bilateral without rales, rhonci, wheezing or stridor. Cardio: Heart sounds are normal with regular rate and rhythm and no murmurs, rubs or gallops. Peripheral pulses are normal and equal bilaterally without edema. No aortic or femoral bruits. Chest: symmetric with normal excursions and percussion.  Abdomen: Soft, with Nl bowel sounds. Nontender, no guarding, rebound, hernias, masses, or organomegaly.  Lymphatics: Non tender without lymphadenopathy.  Musculoskeletal: Full ROM all peripheral extremities, joint stability, 5/5 strength, and normal gait. Skin: Warm and dry without rashes, lesions, cyanosis, clubbing or  ecchymosis.  Neuro: Cranial nerves intact, reflexes equal bilaterally. Normal muscle tone, no cerebellar symptoms. Sensation intact.  Pysch: Alert and oriented x 3 with normal affect, insight and judgment appropriate.   Assessment and Plan  1. Annual Preventative/Screening Exam    2. Class 2 severe obesity due to excess calories with serious comorbidity  and body mass index (BMI) of 37.0 to 37.9  in adult ([place])   3. Essential hypertension  - EKG 12-Lead - [place], RETROPERITNL ABD,  LTD - Urinalysis, Routine w reflex microscopic - Microalbumin / creatinine urine ratio - CBC with Differential/Platelet - COMPLETE METABOLIC PANEL WITH GFR - Magnesium - TSH  4. Hyperlipidemia associated with type 2 diabetes mellitus ([place])  - EKG 12-Lead - [place], RETROPERITNL ABD,  LTD - Lipid panel  5. Type 2 diabetes mellitus with stage 2 chronic kidney  disease, without long-term current use of insulin ([facility])  - EKG 12-Lead - [place], RETROPERITNL ABD,  LTD - HM DIABETES FOOT EXAM - LOW EXTREMITY NEUR EXAM DOCUM - COMPLETE METABOLIC PANEL WITH GFR - Hemoglobin A1c - Insulin, random  6. Vitamin D deficiency  - VITAMIN D 25 Hydroxy   7. Chronic gout   - Uric acid  8. BPH with obstruction/lower urinary tract symptoms  - PSA  9. Screening for colorectal cancer  - Cologuard; Future  10. Screening for prostate cancer  - PSA  11. Screening for ischemic heart disease  - EKG 12-Lead  12. FHx: heart disease  - EKG 12-Lead - [place], RETROPERITNL ABD,  LTD  13. Screening for AAA (aortic abdominal aneurysm)  - [place], RETROPERITNL ABD,  LTD  14. Medication management  - Urinalysis, Routine w reflex microscopic - Microalbumin / creatinine urine ratio - CBC with Differential/Platelet - COMPLETE METABOLIC PANEL WITH GFR - Magnesium - Lipid panel - TSH - Hemoglobin A1c - Insulin, random - VITAMIN D 25 Hydroxy         Patient was counseled in prudent diet, weight control to achieve/maintain BMI less than 25, BP monitoring, regular exercise and medications as discussed.  Discussed med effects and SE's. Routine screening labs and tests as requested with regular follow-up as recommended. Over 40 minutes of exam, counseling, chart review and high complex critical decision making was performed   Kirtland Bouchard, MD

## 2022-06-26 ENCOUNTER — Ambulatory Visit (INDEPENDENT_AMBULATORY_CARE_PROVIDER_SITE_OTHER): Payer: Medicare HMO | Admitting: Internal Medicine

## 2022-06-26 ENCOUNTER — Encounter: Payer: Self-pay | Admitting: Internal Medicine

## 2022-06-26 VITALS — BP 136/86 | HR 66 | Temp 97.9°F | Resp 16 | Ht 74.0 in | Wt 241.6 lb

## 2022-06-26 DIAGNOSIS — I700 Atherosclerosis of aorta: Secondary | ICD-10-CM

## 2022-06-26 DIAGNOSIS — Z0000 Encounter for general adult medical examination without abnormal findings: Secondary | ICD-10-CM

## 2022-06-26 DIAGNOSIS — E1122 Type 2 diabetes mellitus with diabetic chronic kidney disease: Secondary | ICD-10-CM | POA: Diagnosis not present

## 2022-06-26 DIAGNOSIS — N182 Chronic kidney disease, stage 2 (mild): Secondary | ICD-10-CM | POA: Diagnosis not present

## 2022-06-26 DIAGNOSIS — E1169 Type 2 diabetes mellitus with other specified complication: Secondary | ICD-10-CM | POA: Diagnosis not present

## 2022-06-26 DIAGNOSIS — E66812 Obesity, class 2: Secondary | ICD-10-CM

## 2022-06-26 DIAGNOSIS — N401 Enlarged prostate with lower urinary tract symptoms: Secondary | ICD-10-CM | POA: Diagnosis not present

## 2022-06-26 DIAGNOSIS — Z79899 Other long term (current) drug therapy: Secondary | ICD-10-CM

## 2022-06-26 DIAGNOSIS — I10 Essential (primary) hypertension: Secondary | ICD-10-CM | POA: Diagnosis not present

## 2022-06-26 DIAGNOSIS — Z136 Encounter for screening for cardiovascular disorders: Secondary | ICD-10-CM | POA: Diagnosis not present

## 2022-06-26 DIAGNOSIS — Z1211 Encounter for screening for malignant neoplasm of colon: Secondary | ICD-10-CM

## 2022-06-26 DIAGNOSIS — Z8249 Family history of ischemic heart disease and other diseases of the circulatory system: Secondary | ICD-10-CM | POA: Diagnosis not present

## 2022-06-26 DIAGNOSIS — E785 Hyperlipidemia, unspecified: Secondary | ICD-10-CM | POA: Diagnosis not present

## 2022-06-26 DIAGNOSIS — Z0001 Encounter for general adult medical examination with abnormal findings: Secondary | ICD-10-CM

## 2022-06-26 DIAGNOSIS — Z125 Encounter for screening for malignant neoplasm of prostate: Secondary | ICD-10-CM

## 2022-06-26 DIAGNOSIS — M1A09X0 Idiopathic chronic gout, multiple sites, without tophus (tophi): Secondary | ICD-10-CM

## 2022-06-26 DIAGNOSIS — E559 Vitamin D deficiency, unspecified: Secondary | ICD-10-CM | POA: Diagnosis not present

## 2022-06-27 ENCOUNTER — Encounter: Payer: Self-pay | Admitting: Internal Medicine

## 2022-06-27 NOTE — Progress Notes (Signed)
<><><><><><><><><><><><><><><><><><><><><><><><><><><><><><><><><> <><><><><><><><><><><><><><><><><><><><><><><><><><><><><><><><><> -   Test results slightly outside the reference range are not unusual. If there is anything important, I will review this with you,  otherwise it is considered normal test values.  If you have further questions,  please do not hesitate to contact me at the office or via My Chart.  <><><><><><><><><><><><><><><><><><><><><><><><><><><><><><><><><> <><><><><><><><><><><><><><><><><><><><><><><><><><><><><><><><><>  -  Total Cho l = 145   &   LDL Chol = 84   - Both  Excellent   - Very low risk for Heart Attack  / Stroke  <><><><><><><><><><><><><><><><><><><><><><><><><><><><><><><><><> <><><><><><><><><><><><><><><><><><><><><><><><><><><><><><><><><>  -  A1c - Much Much Better - Down from 12.2% in Mar to 6.2% in July to 5.7% - Now   - Is essentially Normal Non Diabetic at this point  !  !  !   <><><><><><><><><><><><><><><><><><><><><><><><><><><><><><><><><> <><><><><><><><><><><><><><><><><><><><><><><><><><><><><><><><><>  -  PSA - Low  - Great !  <><><><><><><><><><><><><><><><><><><><><><><><><><><><><><><><><> <><><><><><><><><><><><><><><><><><><><><><><><><><><><><><><><><>  -  Vitamin D = 74  - Excellent - Keep dose Same  !   <><><><><><><><><><><><><><><><><><><><><><><><><><><><><><><><><> <><><><><><><><><><><><><><><><><><><><><><><><><><><><><><><><><>  -  Uric Acid / Gout test Normal - please continue Allopurinol for Now   <><><><><><><><><><><><><><><><><><><><><><><><><><><><><><><><><> <><><><><><><><><><><><><><><><><><><><><><><><><><><><><><><><><>  -  Chronically, you're getting younger & Healthier with the better diet& weight loss !    <><><><><><><><><><><><><><><><><><><><><><><><><><><><><><><><><> <><><><><><><><><><><><><><><><><><><><><><><><><><><><><><><><><>

## 2022-06-29 LAB — CBC WITH DIFFERENTIAL/PLATELET
Absolute Monocytes: 551 cells/uL (ref 200–950)
Basophils Absolute: 70 cells/uL (ref 0–200)
Basophils Relative: 1.2 %
Eosinophils Absolute: 41 cells/uL (ref 15–500)
Eosinophils Relative: 0.7 %
HCT: 42.5 % (ref 38.5–50.0)
Hemoglobin: 14.2 g/dL (ref 13.2–17.1)
Lymphs Abs: 1508 cells/uL (ref 850–3900)
MCH: 30.7 pg (ref 27.0–33.0)
MCHC: 33.4 g/dL (ref 32.0–36.0)
MCV: 92 fL (ref 80.0–100.0)
MPV: 10.7 fL (ref 7.5–12.5)
Monocytes Relative: 9.5 %
Neutro Abs: 3631 cells/uL (ref 1500–7800)
Neutrophils Relative %: 62.6 %
Platelets: 252 10*3/uL (ref 140–400)
RBC: 4.62 10*6/uL (ref 4.20–5.80)
RDW: 12.7 % (ref 11.0–15.0)
Total Lymphocyte: 26 %
WBC: 5.8 10*3/uL (ref 3.8–10.8)

## 2022-06-29 LAB — PSA: PSA: 2.16 ng/mL (ref <=4.00)

## 2022-06-29 LAB — COMPLETE METABOLIC PANEL WITH GFR
AG Ratio: 1.7 (calc) (ref 1.0–2.5)
ALT: 25 U/L (ref 9–46)
AST: 24 U/L (ref 10–35)
Albumin: 4.8 g/dL (ref 3.6–5.1)
Alkaline phosphatase (APISO): 54 U/L (ref 35–144)
BUN: 12 mg/dL (ref 7–25)
CO2: 24 mmol/L (ref 20–32)
Calcium: 9.8 mg/dL (ref 8.6–10.3)
Chloride: 101 mmol/L (ref 98–110)
Creat: 1.08 mg/dL (ref 0.70–1.28)
Globulin: 2.8 g/dL (calc) (ref 1.9–3.7)
Glucose, Bld: 82 mg/dL (ref 65–99)
Potassium: 4.1 mmol/L (ref 3.5–5.3)
Sodium: 138 mmol/L (ref 135–146)
Total Bilirubin: 0.4 mg/dL (ref 0.2–1.2)
Total Protein: 7.6 g/dL (ref 6.1–8.1)
eGFR: 74 mL/min/{1.73_m2} (ref >=60)

## 2022-06-29 LAB — LIPID PANEL
Cholesterol: 145 mg/dL (ref <200)
HDL: 35 mg/dL — ABNORMAL LOW (ref >=40)
LDL Cholesterol (Calc): 84 mg/dL (calc)
Non-HDL Cholesterol (Calc): 110 mg/dL (calc) (ref <130)
Total CHOL/HDL Ratio: 4.1 (calc) (ref <5.0)
Triglycerides: 165 mg/dL — ABNORMAL HIGH (ref <150)

## 2022-06-29 LAB — URINALYSIS, ROUTINE W REFLEX MICROSCOPIC
Bilirubin Urine: NEGATIVE
Hgb urine dipstick: NEGATIVE
Ketones, ur: NEGATIVE
Leukocytes,Ua: NEGATIVE
Nitrite: NEGATIVE
Protein, ur: NEGATIVE
Specific Gravity, Urine: 1.024 (ref 1.001–1.035)
pH: 5.5 (ref 5.0–8.0)

## 2022-06-29 LAB — HEMOGLOBIN A1C
Hgb A1c MFr Bld: 5.7 % of total Hgb — ABNORMAL HIGH (ref <5.7)
Mean Plasma Glucose: 117 mg/dL
eAG (mmol/L): 6.5 mmol/L

## 2022-06-29 LAB — TSH: TSH: 1.8 mIU/L (ref 0.40–4.50)

## 2022-06-29 LAB — MICROALBUMIN / CREATININE URINE RATIO
Creatinine, Urine: 96 mg/dL (ref 20–320)
Microalb Creat Ratio: 2 mcg/mg creat (ref <30)
Microalb, Ur: 0.2 mg/dL

## 2022-06-29 LAB — MAGNESIUM: Magnesium: 2.1 mg/dL (ref 1.5–2.5)

## 2022-06-29 LAB — INSULIN, RANDOM: Insulin: 7 u[IU]/mL

## 2022-06-29 LAB — URIC ACID: Uric Acid, Serum: 4.7 mg/dL (ref 4.0–8.0)

## 2022-06-29 LAB — VITAMIN D 25 HYDROXY (VIT D DEFICIENCY, FRACTURES): Vit D, 25-Hydroxy: 74 ng/mL (ref 30–100)

## 2022-07-06 ENCOUNTER — Ambulatory Visit
Admission: EM | Admit: 2022-07-06 | Discharge: 2022-07-06 | Disposition: A | Discharge status: Home / Self Care | Payer: Medicare HMO | Attending: Internal Medicine | Admitting: Internal Medicine

## 2022-07-06 DIAGNOSIS — J340 Abscess, furuncle and carbuncle of nose: Secondary | ICD-10-CM | POA: Diagnosis not present

## 2022-07-06 MED ORDER — CLINDAMYCIN HCL 150 MG PO CAPS: 300.0000 mg | ORAL_CAPSULE | Freq: Three times a day (TID) | ORAL | 0 refills | Status: AC

## 2022-07-06 NOTE — ED Provider Notes (Signed)
[facility]    CSN: [id number] Arrival date & time: 07/06/22  1531      History   Chief Complaint Chief Complaint  Patient presents with   nasal drainage    HPI Roger Mcclain is a 71 y.o. male.   Patient presents with nasal drainage from right nostril that started a few days prior.  Patient reports that it feels like "acid" coming from his nose as it burns.  Patient reports he also feels some nasal congestion.  Denies any sensation in the left nostril.  Denies any associated cough, fever, chest pain, shortness of breath, nausea, vomiting, diarrhea, abdominal pain.     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 No pertinent surgical history.     Home Medications    Prior to Admission medications   Medication Sig Start Date End Date Taking? Authorizing Provider  clindamycin (CLEOCIN) 150 MG capsule Take 2 capsules (300 mg total) by mouth 3 (three) times daily for 5 days. 07/06/22 07/11/22 Yes Malyna Budney, Michele Rockers, FNP  Accu-Chek Softclix Lancets lancets Use once daily with Accu-Chek meter to check BS reading. Dx. E11.9 01/15/22   Unk Pinto, MD  allopurinol (ZYLOPRIM) 300 MG tablet Take 1 tablet daily to prevent Gout Attack 08/08/20   Unk Pinto, MD  Ascorbic Acid (VITAMIN C) 1000 MG tablet Take 1,000 mg by mouth  daily.    [provider]  Blood Glucose Monitoring Suppl (ACCU-CHEK AVIVA PLUS) w/Device KIT Use daily to check BS.  Dx. E11.9 01/15/22   Unk Pinto, MD  diphenhydramine-acetaminophen (TYLENOL PM) 25-500 MG TABS tablet Take 1 tablet by mouth at bedtime as needed.    [provider]  empagliflozin (JARDIANCE) 10 MG TABS tablet Take 1 tablet (10 mg total) by mouth daily before breakfast. 05/01/22   Darrol Jump, NP  fenofibrate micronized (LOFIBRA) 134 MG capsule TAKE 1 CAPSULE DAILY FOR TRIGLYCERIDES (BLOOD FATS) 03/09/22   Alycia Rossetti, NP  gabapentin (NEURONTIN) 300 MG capsule TAKE  2 CAPSULES  AT BEDTIME FOR SLEEP 06/04/22   Alycia Rossetti, NP  glucose blood test strip Use 3 x/day before Meals with Accu-Chek meter to check BS.   ( Dx:  e11.9 ) 03/17/22   Unk Pinto, MD  hydrOXYzine (ATARAX) 50 MG tablet TAKE  1 TO 2 TABLETS  1 OR 2 HOURS  BEFORE BEDTIME  AS NEEDED  FOR SLEEP 03/09/22   Alycia Rossetti, NP  losartan-hydrochlorothiazide (HYZAAR) 100-25 MG tablet TAKE 1 TABLET EVERY DAY FOR BLOOD PRESSURE 03/09/22   Alycia Rossetti, NP  Multiple Vitamin (MULTIVITAMIN) tablet Take 1 tablet by mouth daily.    [provider]  rosuvastatin (CRESTOR) 40 MG tablet TAKE 1 TABLET EVERY DAY FOR CHOLESTEROL 03/16/22   Darrol Jump, NP  tadalafil (CIALIS) 20  MG tablet Take 1/2 to 1 tablet every 2 to 3 days as needed for XXXX 12/31/21   Unk Pinto, MD  tamsulosin (FLOMAX) 0.4 MG CAPS capsule Take 1 capsule (0.4 mg total) by mouth daily. 03/05/22   Barrett Henle, MD  traMADol (ULTRAM) 50 MG tablet Take 1 tablet (50 mg total) by mouth every 6 (six) hours as needed. 03/05/22   Barrett Henle, MD  traZODone (DESYREL) 50 MG tablet Take 1 to 3 tablets 1 hour before Bedtime as needed for Sleep 10/21/21   Unk Pinto, MD  Vitamin D, Ergocalciferol, (DRISDOL) 1.25 MG (50000 UNIT) CAPS capsule Take 50,000 Units by mouth. Takes 1 capsule every other day     [provider]  zinc gluconate 50 MG tablet Take 50 mg by mouth daily.    [provider]    Family History Family History  Problem Relation Age of Onset   Hypertension Mother    Heart disease Mother    Diabetes Father    Hyperlipidemia Father    Hypertension Father     Social History Social History   Tobacco Use   Smoking status: Never   Smokeless tobacco: Never  Substance Use Topics   Alcohol use: Yes   Drug use: No     Allergies   Morphine   Review of Systems Review of Systems Per HPI  Physical Exam Triage Vital Signs ED Triage Vitals  Enc Vitals Group     BP 07/06/22 1623 128/77     Pulse Rate 07/06/22 1623 82     Resp 07/06/22 1623 16     Temp 07/06/22 1623 98.5 F (36.9 C)     Temp Source 07/06/22 1623 Oral     SpO2 07/06/22 1623 97 %     Weight --      Height --      Head Circumference --      Peak Flow --      Pain Score 07/06/22 1624 3     Pain Loc --      Pain Edu? --      Excl. in [place]? --    No data found.  Updated Vital Signs BP 128/77 (BP Location: Left Arm)   Pulse 82   Temp 98.5 F (36.9 C) (Oral)   Resp 16   SpO2 97%   Visual Acuity Right Eye Distance:   Left Eye Distance:   Bilateral Distance:    Right Eye Near:   Left Eye Near:    Bilateral Near:     Physical Exam Constitutional:      General: He is not in acute distress.    Appearance: Normal appearance. He is not toxic-appearing or diaphoretic.  HENT:     Head: Normocephalic and atraumatic.     Nose: Mucosal edema present.     Comments: Patient has significant swelling inside right nostril specifically to right side of nostril.  There is also some discoloration on outer portion of nose that is erythematous and mildly swollen as well. Eyes:     Extraocular Movements: Extraocular movements intact.     Conjunctiva/sclera: Conjunctivae normal.  Pulmonary:     Effort: Pulmonary effort is normal.  Neurological:     General: No focal deficit present.      Mental Status: He is alert and oriented to person, place, and time. Mental status is at baseline.  Psychiatric:        Mood and Affect: Mood normal.  Behavior: Behavior normal.        Thought Content: Thought content normal.        Judgment: Judgment normal.      UC Treatments / Results  Labs (all labs ordered are listed, but only abnormal results are displayed) Labs Reviewed - No data to display  EKG   Radiology No results found.  Procedures Procedures (including critical care time)  Medications Ordered in UC Medications - No data to display  Initial Impression / Assessment and Plan / UC Course  I have reviewed the triage vital signs and the nursing notes.  Pertinent labs & imaging results that were available during my care of the patient were reviewed by me and considered in my medical decision making (see chart for details).     Patient's physical exam is concerning for possible nasal abscess with concern for cellulitis to outside of nose.  It does not appear to be spread to sinuses or orbital cavities.  Will treat with clindamycin.  Patient was advised of the importance of monitoring this very closely for any increased or worsening symptoms.  Patient advised to go to the emergency department for further evaluation and management if no improvement in symptoms in the next 24 to 48 hours.  Patient verbalized understanding and was agreeable with plan. Final Clinical Impressions(s) / UC Diagnoses   Final diagnoses:  Nasal abscess     Discharge Instructions      It appears that you have an abscess/infection inside your nose.  This is being treated with an antibiotic.  Please follow-up at the emergency department if symptoms persist or worsen in the next 48 hours or so.    ED Prescriptions     Medication Sig Dispense Auth. Provider   clindamycin (CLEOCIN) 150 MG capsule Take 2 capsules (300 mg total) by mouth 3 (three) times daily for 5 days. 28 capsule  [place], Michele Rockers, [place]      PDMP not reviewed this encounter.   Teodora Medici, [place] 07/06/22 [phone]

## 2022-07-06 NOTE — ED Triage Notes (Signed)
Pt c/o sensation of acid dripping from right nostril concerned for sinus infection states he gets it yearly. Onset ~ Friday

## 2022-07-06 NOTE — Discharge Instructions (Signed)
It appears that you have an abscess/infection inside your nose.  This is being treated with an antibiotic.  Please follow-up at the emergency department if symptoms persist or worsen in the next 48 hours or so.

## 2022-07-09 DIAGNOSIS — H2512 Age-related nuclear cataract, left eye: Secondary | ICD-10-CM | POA: Diagnosis not present

## 2022-07-09 DIAGNOSIS — H2513 Age-related nuclear cataract, bilateral: Secondary | ICD-10-CM | POA: Diagnosis not present

## 2022-07-09 DIAGNOSIS — Z961 Presence of intraocular lens: Secondary | ICD-10-CM | POA: Diagnosis not present

## 2022-07-09 DIAGNOSIS — H401132 Primary open-angle glaucoma, bilateral, moderate stage: Secondary | ICD-10-CM | POA: Diagnosis not present

## 2022-07-09 DIAGNOSIS — H401121 Primary open-angle glaucoma, left eye, mild stage: Secondary | ICD-10-CM | POA: Diagnosis not present

## 2022-07-28 ENCOUNTER — Other Ambulatory Visit: Payer: Self-pay

## 2022-07-28 MED ORDER — ALLOPURINOL 300 MG PO TABS: ORAL_TABLET | ORAL | 3 refills | Status: AC

## 2022-08-12 DIAGNOSIS — H04123 Dry eye syndrome of bilateral lacrimal glands: Secondary | ICD-10-CM | POA: Diagnosis not present

## 2022-08-16 ENCOUNTER — Ambulatory Visit
Admission: EM | Admit: 2022-08-16 | Discharge: 2022-08-16 | Disposition: A | Discharge status: Home / Self Care | Payer: Medicare HMO | Attending: Physician Assistant | Admitting: Physician Assistant

## 2022-08-16 DIAGNOSIS — Z1152 Encounter for screening for COVID-19: Secondary | ICD-10-CM | POA: Diagnosis not present

## 2022-08-16 DIAGNOSIS — J069 Acute upper respiratory infection, unspecified: Secondary | ICD-10-CM | POA: Diagnosis not present

## 2022-08-16 LAB — RESP PANEL BY RT-PCR (FLU A&B, COVID) ARPGX2
Influenza A by PCR: NEGATIVE
Influenza B by PCR: NEGATIVE
SARS Coronavirus 2 by RT PCR: NEGATIVE

## 2022-08-16 MED ORDER — GUAIFENESIN-DM 100-10 MG/5ML PO SYRP: 5.0000 mL | ORAL_SOLUTION | ORAL | 0 refills | Status: DC | PRN

## 2022-08-16 NOTE — ED Triage Notes (Signed)
Pt presents with sinus headache, cough, and scratchy throat X 3 days; pt has Hx of pneumonia

## 2022-08-16 NOTE — ED Provider Notes (Signed)
[facility]    CSN: [id number] Arrival date & time: 08/16/22  1119      History   Chief Complaint Chief Complaint  Patient presents with   Headache   Cough    HPI Roger Mcclain is a 71 y.o. male.   Patient here today for evaluation of sinus headache, cough, scratchy throat that started 3 days ago.  He has had questionable fever but has not measured temperature.  He does have history of pneumonia which concerned him.  He denies any nausea, vomiting or diarrhea.  He does not report treatment for symptoms.  The history is provided by the patient.  Headache Associated symptoms: congestion, cough, fever (subjective), sinus pressure and sore throat   Associated symptoms: no abdominal pain, no ear pain, no nausea and no vomiting   Cough Associated symptoms: chills, fever (subjective), headaches and sore throat   Associated symptoms: no ear pain, no eye discharge and no shortness of breath     Past Medical History:  Diagnosis Date   Diabetes mellitus without complication ([place]) 3220   Fatty liver    Hyperlipidemia 2017   Hypertension 2010    Patient Active Problem List   Diagnosis Date Noted   Cyst of left kidney 5.5 CM on U/S 10/2021 repeat in 1 year to check stability 10/15/2021   History of COVID-19 10/31/2019   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 No pertinent surgical history.     Home Medications    Prior to Admission medications   Medication Sig Start Date End Date Taking? Authorizing Provider  guaiFENesin-dextromethorphan (ROBITUSSIN DM) 100-10 MG/5ML syrup Take 5 mLs by mouth every 4 (four) hours as needed for cough.  08/16/22  Yes Francene Finders, PA-C  Accu-Chek Softclix Lancets lancets Use once daily with Accu-Chek meter to check BS reading. Dx. E11.9 01/15/22   Unk Pinto, MD  allopurinol (ZYLOPRIM) 300 MG tablet Take 1 tablet daily to prevent Gout Attack 07/28/22   Unk Pinto, MD  Ascorbic Acid (VITAMIN C) 1000 MG tablet Take 1,000 mg by mouth daily.    [provider]  Blood Glucose Monitoring Suppl (ACCU-CHEK AVIVA PLUS) w/Device KIT Use daily to check BS.  Dx. E11.9 01/15/22   Unk Pinto, MD  diphenhydramine-acetaminophen (TYLENOL PM) 25-500 MG TABS tablet Take 1 tablet by mouth at bedtime as needed.    [provider]  empagliflozin (JARDIANCE) 10 MG TABS tablet Take 1 tablet (10 mg total) by mouth daily before breakfast. 05/01/22   Darrol Jump, NP  fenofibrate micronized (LOFIBRA) 134 MG capsule TAKE 1 CAPSULE DAILY FOR TRIGLYCERIDES (BLOOD FATS) 03/09/22   Alycia Rossetti, NP  gabapentin (NEURONTIN) 300 MG capsule TAKE  2 CAPSULES  AT BEDTIME FOR SLEEP 06/04/22   Alycia Rossetti, NP  glucose blood test strip Use 3 x/day before Meals with Accu-Chek meter to check BS.   ( Dx:  e11.9 ) 03/17/22   Unk Pinto, MD  hydrOXYzine (ATARAX) 50 MG tablet TAKE  1 TO 2 TABLETS  1 OR 2 HOURS  BEFORE BEDTIME  AS NEEDED  FOR SLEEP 03/09/22   Alycia Rossetti, NP  losartan-hydrochlorothiazide (HYZAAR) 100-25 MG tablet  TAKE 1 TABLET EVERY DAY FOR BLOOD PRESSURE 03/09/22   Alycia Rossetti, NP  Multiple Vitamin (MULTIVITAMIN) tablet Take 1 tablet by mouth daily.    [provider]  rosuvastatin (CRESTOR) 40 MG tablet TAKE 1 TABLET EVERY DAY FOR CHOLESTEROL 03/16/22   Darrol Jump, NP  tadalafil (CIALIS) 20 MG tablet Take 1/2 to 1 tablet every 2 to 3 days as needed for XXXX 12/31/21   Unk Pinto, MD  tamsulosin (FLOMAX) 0.4 MG CAPS capsule Take 1 capsule (0.4 mg total) by mouth daily. 03/05/22   Barrett Henle, MD  traMADol (ULTRAM) 50 MG tablet Take 1 tablet (50  mg total) by mouth every 6 (six) hours as needed. 03/05/22   Barrett Henle, MD  traZODone (DESYREL) 50 MG tablet Take 1 to 3 tablets 1 hour before Bedtime as needed for Sleep 10/21/21   Unk Pinto, MD  Vitamin D, Ergocalciferol, (DRISDOL) 1.25 MG (50000 UNIT) CAPS capsule Take 50,000 Units by mouth. Takes 1 capsule every other day    [provider]  zinc gluconate 50 MG tablet Take 50 mg by mouth daily.    [provider]    Family History Family History  Problem Relation Age of Onset   Hypertension Mother    Heart disease Mother    Diabetes Father    Hyperlipidemia Father    Hypertension Father     Social History Social History   Tobacco Use   Smoking status: Never   Smokeless tobacco: Never  Substance Use Topics   Alcohol use: Yes   Drug use: No     Allergies   Morphine   Review of Systems Review of Systems  Constitutional:  Positive for chills and fever (subjective).  HENT:  Positive for congestion, sinus pressure, sinus pain and sore throat. Negative for ear pain.   Eyes:  Negative for discharge and redness.  Respiratory:  Positive for cough. Negative for shortness of breath.   Gastrointestinal:  Negative for abdominal pain, nausea and vomiting.  Neurological:  Positive for headaches.     Physical Exam Triage Vital Signs ED Triage Vitals [08/16/22 1231]  Enc Vitals Group     BP 130/70     Pulse Rate 77     Resp 17     Temp 98 F (36.7 C)     Temp Source Oral     SpO2 97 %     Weight      Height      Head Circumference      Peak Flow      Pain Score 2     Pain Loc      Pain Edu?      Excl. in [place]?    No data found.  Updated Vital Signs BP 130/70 (BP Location: Left Arm)   Pulse 77   Temp 98 F (36.7 C) (Oral)   Resp 17   SpO2 97%      Physical Exam Vitals and nursing note reviewed.  Constitutional:      General: He is not in acute distress.    Appearance: Normal appearance. He is not ill-appearing.  HENT:      Head: Normocephalic and atraumatic.     Nose: Congestion present.     Mouth/Throat:     Mouth: Mucous membranes are moist.     Pharynx: Oropharynx is clear. No oropharyngeal exudate or posterior oropharyngeal erythema.  Eyes:     Conjunctiva/sclera: Conjunctivae normal.  Cardiovascular:  Rate and Rhythm: Normal rate and regular rhythm.     Heart sounds: Normal heart sounds. No murmur heard. Pulmonary:     Effort: Pulmonary effort is normal. No respiratory distress.     Breath sounds: Normal breath sounds. No wheezing, rhonchi or rales.  Skin:    General: Skin is warm and dry.  Neurological:     Mental Status: He is alert.  Psychiatric:        Mood and Affect: Mood normal.        Thought Content: Thought content normal.      UC Treatments / Results  Labs (all labs ordered are listed, but only abnormal results are displayed) Labs Reviewed  RESP PANEL BY RT-PCR (FLU A&B, COVID) [id number]    EKG   Radiology No results found.  Procedures Procedures (including critical care time)  Medications Ordered in UC Medications - No data to display  Initial Impression / Assessment and Plan / UC Course  I have reviewed the triage vital signs and the nursing notes.  Pertinent labs & imaging results that were available during my care of the patient were reviewed by me and considered in my medical decision making (see chart for details).    Suspect most likely viral etiology of symptoms.  Will order COVID and flu screening.  Cough syrup prescribed to hopefully help with congestion.  Encouraged follow-up with any further concerns or worsening symptoms.  Final Clinical Impressions(s) / UC Diagnoses   Final diagnoses:  Encounter for screening for COVID-19  Acute upper respiratory infection   Discharge Instructions   None    ED Prescriptions     Medication Sig Dispense Auth. Provider   guaiFENesin-dextromethorphan (ROBITUSSIN DM) 100-10 MG/5ML syrup Take 5 mLs by mouth every 4  (four) hours as needed for cough. 118 mL Francene Finders, PA-C      PDMP not reviewed this encounter.   Francene Finders, PA-C 08/16/22 1422

## 2022-08-28 ENCOUNTER — Encounter: Payer: Self-pay | Admitting: Physician Assistant

## 2022-08-28 ENCOUNTER — Ambulatory Visit
Admission: EM | Admit: 2022-08-28 | Discharge: 2022-08-28 | Disposition: A | Discharge status: Home / Self Care | Payer: Medicare HMO | Attending: Physician Assistant | Admitting: Physician Assistant

## 2022-08-28 DIAGNOSIS — J019 Acute sinusitis, unspecified: Secondary | ICD-10-CM

## 2022-08-28 DIAGNOSIS — J209 Acute bronchitis, unspecified: Secondary | ICD-10-CM | POA: Diagnosis not present

## 2022-08-28 MED ORDER — PREDNISONE 20 MG PO TABS: 40.0000 mg | ORAL_TABLET | Freq: Every day | ORAL | 0 refills | Status: AC

## 2022-08-28 MED ORDER — AMOXICILLIN-POT CLAVULANATE 875-125 MG PO TABS: 1.0000 | ORAL_TABLET | Freq: Two times a day (BID) | ORAL | 0 refills | Status: DC

## 2022-08-28 NOTE — ED Triage Notes (Signed)
Pt presents to uc with co of cough, congestion, and cp from couch for 3 weeks. Pt reports has attempted multiple otc with no improvement.

## 2022-08-28 NOTE — ED Provider Notes (Signed)
[facility]    CSN: [id number] Arrival date & time: 08/28/22  0818      History   Chief Complaint Chief Complaint  Patient presents with   URI    HPI Roger Mcclain is a 71 y.o. male.   Patient here today for evaluation of cough, congestion and chest pain from cough that he has had for the last 2 weeks.  He reports that he has tried multiple over-the-counter medications without significant improvement.  He was initially screened for COVID and flu which was negative.  He denies any fever since the onset of symptoms.  He has not had any nausea, vomiting or diarrhea.  The history is provided by the patient.    Past Medical History:  Diagnosis Date   Diabetes mellitus without complication ([place]) 8119   Fatty liver    Hyperlipidemia 2017   Hypertension 2010    Patient Active Problem List   Diagnosis Date Noted   Cyst of left kidney 5.5 CM on U/S 10/2021 repeat in 1 year to check stability 10/15/2021   History of COVID-19 10/31/2019   CKD stage 1 due to type 2 diabetes mellitus ([place]) 08/16/2019   Elevated LFTs 07/22/2018   Gout of multiple sites 07/20/2018   Morbid obesity ([place]) 10/01/2017   Hyperlipidemia associated with type 2 diabetes mellitus ([place]) 12/27/2016   Vitamin D deficiency 12/27/2016   Diabetes mellitus ([place]) 12/24/2016   Essential hypertension 12/24/2016    History reviewed. No pertinent surgical history.     Home Medications    Prior to Admission medications   Medication Sig Start Date End Date Taking? Authorizing Provider  amoxicillin-clavulanate (AUGMENTIN) 875-125 MG tablet Take 1 tablet by mouth every 12 (twelve) hours. 08/28/22  Yes Francene Finders, PA-C  predniSONE (DELTASONE) 20 MG tablet Take 2 tablets (40 mg total) by mouth daily with breakfast for 5 days. 08/28/22 09/02/22 Yes Francene Finders, PA-C  Accu-Chek Softclix Lancets lancets Use once daily with Accu-Chek meter to check BS reading. Dx. E11.9 01/15/22   Unk Pinto,  MD  allopurinol (ZYLOPRIM) 300 MG tablet Take 1 tablet daily to prevent Gout Attack 07/28/22   Unk Pinto, MD  Ascorbic Acid (VITAMIN C) 1000 MG tablet Take 1,000 mg by mouth daily.    [provider]  Blood Glucose Monitoring Suppl (ACCU-CHEK AVIVA PLUS) w/Device KIT Use daily to check BS.  Dx. E11.9 01/15/22   Unk Pinto, MD  diphenhydramine-acetaminophen (TYLENOL PM) 25-500 MG TABS tablet Take 1 tablet by mouth at bedtime as needed.    [provider]  empagliflozin (JARDIANCE) 10 MG TABS tablet Take 1 tablet (10 mg total) by mouth daily before breakfast. 05/01/22   Darrol Jump, NP  fenofibrate micronized (LOFIBRA) 134 MG capsule TAKE 1 CAPSULE DAILY FOR TRIGLYCERIDES (BLOOD FATS) 03/09/22   Alycia Rossetti, NP  gabapentin (NEURONTIN) 300 MG capsule TAKE  2 CAPSULES  AT BEDTIME FOR SLEEP 06/04/22   Alycia Rossetti, NP  glucose blood test strip Use 3 x/day before Meals with Accu-Chek meter to check BS.   ( Dx:  e11.9 ) 03/17/22   Unk Pinto, MD  guaiFENesin-dextromethorphan (ROBITUSSIN DM) 100-10 MG/5ML syrup Take 5 mLs by mouth every 4 (four) hours as needed for cough. 08/16/22   Francene Finders, PA-C  hydrOXYzine (ATARAX) 50 MG tablet TAKE  1 TO 2 TABLETS  1 OR 2 HOURS  BEFORE BEDTIME  AS NEEDED  FOR SLEEP 03/09/22   Alycia Rossetti, NP  losartan-hydrochlorothiazide (  HYZAAR) 100-25 MG tablet TAKE 1 TABLET EVERY DAY FOR BLOOD PRESSURE 03/09/22   Alycia Rossetti, NP  Multiple Vitamin (MULTIVITAMIN) tablet Take 1 tablet by mouth daily.    [provider]  rosuvastatin (CRESTOR) 40 MG tablet TAKE 1 TABLET EVERY DAY FOR CHOLESTEROL 03/16/22   Darrol Jump, NP  tadalafil (CIALIS) 20 MG tablet Take 1/2 to 1 tablet every 2 to 3 days as needed for XXXX 12/31/21   Unk Pinto, MD  tamsulosin (FLOMAX) 0.4 MG CAPS capsule Take 1 capsule (0.4 mg total) by mouth daily. 03/05/22   Barrett Henle, MD  traMADol (ULTRAM) 50 MG tablet Take 1 tablet (50 mg  total) by mouth every 6 (six) hours as needed. 03/05/22   Barrett Henle, MD  traZODone (DESYREL) 50 MG tablet Take 1 to 3 tablets 1 hour before Bedtime as needed for Sleep 10/21/21   Unk Pinto, MD  Vitamin D, Ergocalciferol, (DRISDOL) 1.25 MG (50000 UNIT) CAPS capsule Take 50,000 Units by mouth. Takes 1 capsule every other day    [provider]  zinc gluconate 50 MG tablet Take 50 mg by mouth daily.    [provider]    Family History Family History  Problem Relation Age of Onset   Hypertension Mother    Heart disease Mother    Diabetes Father    Hyperlipidemia Father    Hypertension Father     Social History Social History   Tobacco Use   Smoking status: Never   Smokeless tobacco: Never  Substance Use Topics   Alcohol use: Yes   Drug use: No     Allergies   Morphine   Review of Systems Review of Systems  Constitutional:  Negative for chills and fever.  HENT:  Positive for congestion. Negative for ear pain.   Eyes:  Negative for discharge and redness.  Respiratory:  Positive for cough. Negative for shortness of breath.   Gastrointestinal:  Negative for abdominal pain, diarrhea, nausea and vomiting.     Physical Exam Triage Vital Signs ED Triage Vitals  Enc Vitals Group     BP      Pulse      Resp      Temp      Temp src      SpO2      Weight      Height      Head Circumference      Peak Flow      Pain Score      Pain Loc      Pain Edu?      Excl. in [place]?    No data found.  Updated Vital Signs BP 119/75   Pulse 78   Temp 98.1 F (36.7 C) (Oral)   Resp 19   SpO2 98%    Physical Exam Vitals and nursing note reviewed.  Constitutional:      General: He is not in acute distress.    Appearance: Normal appearance. He is not ill-appearing.  HENT:     Head: Normocephalic and atraumatic.     Nose: Congestion present.  Eyes:     Conjunctiva/sclera: Conjunctivae normal.  Cardiovascular:     Rate and Rhythm: Normal rate  and regular rhythm.     Heart sounds: Normal heart sounds. No murmur heard. Pulmonary:     Effort: Pulmonary effort is normal. No respiratory distress.     Breath sounds: Normal breath sounds. No wheezing, rhonchi or rales.  Skin:  General: Skin is warm and dry.  Neurological:     Mental Status: He is alert.  Psychiatric:        Mood and Affect: Mood normal.        Thought Content: Thought content normal.      UC Treatments / Results  Labs (all labs ordered are listed, but only abnormal results are displayed) Labs Reviewed - No data to display  EKG   Radiology No results found.  Procedures Procedures (including critical care time)  Medications Ordered in UC Medications - No data to display  Initial Impression / Assessment and Plan / UC Course  I have reviewed the triage vital signs and the nursing notes.  Pertinent labs & imaging results that were available during my care of the patient were reviewed by me and considered in my medical decision making (see chart for details).    Will treat to cover sinusitis as well as bronchitis.  Steroid burst and Augmentin prescribed.  Recommended follow-up if no gradual improvement or with any further concerns.  Patient expresses understanding.  Final Clinical Impressions(s) / UC Diagnoses   Final diagnoses:  Acute sinusitis, recurrence not specified, unspecified location  Acute bronchitis, unspecified organism   Discharge Instructions   None    ED Prescriptions     Medication Sig Dispense Auth. Provider   predniSONE (DELTASONE) 20 MG tablet Take 2 tablets (40 mg total) by mouth daily with breakfast for 5 days. 10 tablet Francene Finders, PA-C   amoxicillin-clavulanate (AUGMENTIN) 875-125 MG tablet Take 1 tablet by mouth every 12 (twelve) hours. 14 tablet Francene Finders, PA-C      PDMP not reviewed this encounter.   Francene Finders, PA-C 08/28/22 1027

## 2022-10-01 NOTE — Progress Notes (Signed)
FOLLOW UP  Assessment and Plan:   Hypertension Well controlled with current medications  Monitor blood pressure at home; patient to call if consistently greater than 130/80 Continue DASH diet.   Reminder to go to the ER if any CP, SOB, nausea, dizziness, severe HA, changes vision/speech, left arm numbness and tingling and jaw pain.  Hyperlipidemia associated with Type 2 Diabetes mellitus Currently very near goal - LDL <70 ; continue rosuvastatin, fenofibrate; diet discussed Continue low cholesterol diet and exercise.  Check lipid panel.   Diabetes with Stage 2 CKD Continue medication: metformin Continue diet and exercise.  Perform daily foot/skin check, notify office of any concerning changes.  Check A1C  CKD 2 associated with T2DM Increase fluids, avoid NSAIDS, monitor sugars, will monitor CMP, CBC  Type 2 Diabetes with Morbid obesity - BMI 35 with co morbidities Long discussion about weight loss, diet, and exercise Recommended diet heavy in fruits and veggies and low in animal meats, cheeses, and dairy products, appropriate calorie intake Discussed ideal weight for height  Patient will work on portions of potatoes, rice, beer Will follow up in 3 months  Vitamin D Def Below goal at last visit; he has changed dose continue supplementation to maintain goal of 70-100   Erectile dysfunction related to DM Continue Cialis- refilled today Control blood sugars  Gout Continue allopurinol Diet discussed Check uric acid as needed  Elevated LTFs U/S 10/14/21 showed heapatic steatosis Recheck CMP for LFTs avoid tylenol, alcohol, weight loss advised.      Continue diet and meds as discussed. Further disposition pending results of labs. Discussed med's effects and SE's.   Over 30 minutes of exam, counseling, chart review, and critical decision making was performed.   Future Appointments  Date Time Provider [place]  01/01/2023 11:30 AM Unk Pinto, MD  [facility] None  03/05/2023 10:00 AM Darrol Jump, NP [facility] None  07/01/2023 10:00 AM Unk Pinto, MD [facility] None    ----------------------------------------------------------------------------------------------------------------------  HPI 71 y.o. male  presents for 3 month follow up on hypertension, cholesterol, diabetes, obesity and vitamin D deficiency.   He is using Cialis for erectile dysfunction, helping currently  He does have congestion but is improving, still having some clear nasal drainage.  No coughing, wheezing, muscle aches, fevers, nausea, vomiting  and diarrhea.   BMI is Body mass index is 32.53 kg/m., he has been working on diet and exercise, eats mainly vegetables, manages 6-8 properties/yardwork. Reports he has essentially cut bread and starches. Drinks 5-6 bottles of water daily. He ate more sweets over the holidays Wt Readings from Last 3 Encounters:  10/02/22 253 lb 6.4 oz (114.9 kg)  06/26/22 241 lb 9.6 oz (109.6 kg)  04/10/22 242 lb (109.8 kg)   His blood pressure has been controlled at home, today their BP is BP: 114/66 BP Readings from Last 3 Encounters:  10/02/22 114/66  08/28/22 119/75  08/16/22 130/70     He does not workout but works intense job. He denies chest pain, shortness of breath, dizziness.   He is on cholesterol medication Rosuvastatin 40 mg daily and fenofibrate 134 mg daily and denies myalgias. His cholesterol is not at goal. The cholesterol last visit was:   Lab Results  Component Value Date   CHOL 145 06/26/2022   HDL 35 (L) 06/26/2022   LDLCALC 84 06/26/2022   TRIG 165 (H) 06/26/2022   CHOLHDL 4.1 06/26/2022    He has been working on diet and exercise for T2DM well controlled by lifestyle  and metformin 1000 mg daily, and denies foot ulcerations, increased appetite, nausea, paresthesia of the feet, polydipsia, polyuria and visual disturbances. He does check fasting sugars typically runs 95-178  Last A1C in the office  was:  Lab Results  Component Value Date   HGBA1C 5.7 (H) 06/26/2022   Patient is on Vitamin D supplement and increased dose after last visit, now taking 5000 x 2 tab:  Lab Results  Component Value Date   VD25OH 74 06/26/2022     Patient is on allopurinol for gout (reports he takes intermittently) and does not report a recent flare.  Lab Results  Component Value Date   LABURIC 4.7 06/26/2022   He has had LFT elevations trending up; GGT on 07/21/2018 was 93; iron and saturation were low with elevated ferritin; he had negative hep C 08/2019. US abdomen showed hepatic steatosis. He has fib 4 score of 2.33, approximate [place] of 2-3.  Lab Results  Component Value Date   ALT 25 06/26/2022   AST 24 06/26/2022   ALKPHOS 49 12/24/2016   BILITOT 0.4 06/26/2022     Current Medications:  Current Outpatient Medications on File Prior to Visit  Medication Sig   Accu-Chek Softclix Lancets lancets Use once daily with Accu-Chek meter to check BS reading. Dx. E11.9   allopurinol (ZYLOPRIM) 300 MG tablet Take 1 tablet daily to prevent Gout Attack   Ascorbic Acid (VITAMIN C) 1000 MG tablet Take 1,000 mg by mouth daily.   Blood Glucose Monitoring Suppl (ACCU-CHEK AVIVA PLUS) w/Device KIT Use daily to check BS.  Dx. E11.9   diphenhydramine-acetaminophen (TYLENOL PM) 25-500 MG TABS tablet Take 1 tablet by mouth at bedtime as needed.   empagliflozin (JARDIANCE) 10 MG TABS tablet Take 1 tablet (10 mg total) by mouth daily before breakfast.   fenofibrate micronized (LOFIBRA) 134 MG capsule TAKE 1 CAPSULE DAILY FOR TRIGLYCERIDES (BLOOD FATS)   gabapentin (NEURONTIN) 300 MG capsule TAKE  2 CAPSULES  AT BEDTIME FOR SLEEP   glucose blood test strip Use 3 x/day before Meals with Accu-Chek meter to check BS.   ( Dx:  e11.9 )   hydrOXYzine (ATARAX) 50 MG tablet TAKE  1 TO 2 TABLETS  1 OR 2 HOURS  BEFORE BEDTIME  AS NEEDED  FOR SLEEP   losartan-hydrochlorothiazide (HYZAAR) 100-25 MG tablet TAKE 1 TABLET EVERY DAY  FOR BLOOD PRESSURE   Multiple Vitamin (MULTIVITAMIN) tablet Take 1 tablet by mouth daily.   rosuvastatin (CRESTOR) 40 MG tablet TAKE 1 TABLET EVERY DAY FOR CHOLESTEROL   tadalafil (CIALIS) 20 MG tablet Take 1/2 to 1 tablet every 2 to 3 days as needed for XXXX   Vitamin D, Ergocalciferol, (DRISDOL) 1.25 MG (50000 UNIT) CAPS capsule Take 50,000 Units by mouth. Takes 1 capsule every other day   zinc gluconate 50 MG tablet Take 50 mg by mouth daily.   amoxicillin-clavulanate (AUGMENTIN) 875-125 MG tablet Take 1 tablet by mouth every 12 (twelve) hours. (Patient not taking: Reported on 10/02/2022)   guaiFENesin-dextromethorphan (ROBITUSSIN DM) 100-10 MG/5ML syrup Take 5 mLs by mouth every 4 (four) hours as needed for cough. (Patient not taking: Reported on 10/02/2022)   tamsulosin (FLOMAX) 0.4 MG CAPS capsule Take 1 capsule (0.4 mg total) by mouth daily. (Patient not taking: Reported on 10/02/2022)   traMADol (ULTRAM) 50 MG tablet Take 1 tablet (50 mg total) by mouth every 6 (six) hours as needed. (Patient not taking: Reported on 10/02/2022)   traZODone (DESYREL) 50 MG tablet Take 1 to 3  tablets 1 hour before Bedtime as needed for Sleep (Patient not taking: Reported on 10/02/2022)   No current facility-administered medications on file prior to visit.      Allergies:  Allergies  Allergen Reactions   Morphine      Medical History:  Past Medical History:  Diagnosis Date   Diabetes mellitus without complication ([place]) 1027   Fatty liver    Hyperlipidemia 2017   Hypertension 2010   Family history- Reviewed and unchanged Social history- Reviewed and unchanged   Review of Systems:  Review of Systems  Constitutional:  Negative for malaise/fatigue and weight loss.  HENT:  Positive for congestion. Negative for hearing loss, nosebleeds, sinus pain and tinnitus.   Eyes:  Negative for blurred vision and double vision.  Respiratory:  Negative for cough, shortness of breath and wheezing.    Cardiovascular:  Negative for chest pain, palpitations, orthopnea, claudication and leg swelling.  Gastrointestinal:  Negative for abdominal pain, blood in stool, constipation, diarrhea, heartburn, melena, nausea and vomiting.  Genitourinary: Negative.   Musculoskeletal:  Negative for joint pain and myalgias.  Skin:  Negative for rash.  Neurological:  Negative for dizziness, tingling, sensory change, weakness and headaches.  Endo/Heme/Allergies:  Negative for polydipsia.  Psychiatric/Behavioral: Negative.  Negative for depression.   All other systems reviewed and are negative.     Physical Exam: BP 114/66   Pulse 81   Temp (!) 97.2 F (36.2 C)   Ht _0  (1.88 m)   Wt 253 lb 6.4 oz (114.9 kg)   SpO2 97%   BMI 32.53 kg/m  Wt Readings from Last 3 Encounters:  10/02/22 253 lb 6.4 oz (114.9 kg)  06/26/22 241 lb 9.6 oz (109.6 kg)  04/10/22 242 lb (109.8 kg)   General Appearance: Pleasant obese male, in no apparent distress. Eyes: PERRLA, EOMs, conjunctiva no swelling or erythema Sinuses: Positive frontal tenderness bilaterally ENT/Mouth: Ext aud canals clear, TMs fluid bilaterally, dull. No erythema, swelling, or exudate on post pharynx.  Tonsils not swollen or erythematous. Hearing normal.  Neck: Supple, thyroid normal.  Respiratory: Respiratory effort normal, BS equal bilaterally without rales, rhonchi, wheezing or stridor.  Cardio: RRR with no MRGs. Brisk peripheral pulses without edema.  Abdomen: Soft, + BS.  Non tender, no guarding, rebound, hernias, masses. Lymphatics: Non tender without lymphadenopathy.  Musculoskeletal: Full ROM, 5/5 strength, Normal gait Skin: Warm, dry without rashes, lesions, ecchymosis.  Neuro: Cranial nerves intact. No cerebellar symptoms.  Psych: Awake and oriented X 3, normal affect, Insight and Judgment appropriate.    Alycia Rossetti, NP 9:34 AM [facility]

## 2022-10-02 ENCOUNTER — Encounter: Payer: Self-pay | Admitting: Nurse Practitioner

## 2022-10-02 ENCOUNTER — Ambulatory Visit (INDEPENDENT_AMBULATORY_CARE_PROVIDER_SITE_OTHER): Payer: Medicare HMO | Admitting: Nurse Practitioner

## 2022-10-02 VITALS — BP 114/66 | HR 81 | Temp 97.2°F | Ht 74.0 in | Wt 253.4 lb

## 2022-10-02 DIAGNOSIS — N182 Chronic kidney disease, stage 2 (mild): Secondary | ICD-10-CM | POA: Diagnosis not present

## 2022-10-02 DIAGNOSIS — N521 Erectile dysfunction due to diseases classified elsewhere: Secondary | ICD-10-CM | POA: Diagnosis not present

## 2022-10-02 DIAGNOSIS — E785 Hyperlipidemia, unspecified: Secondary | ICD-10-CM | POA: Diagnosis not present

## 2022-10-02 DIAGNOSIS — I10 Essential (primary) hypertension: Secondary | ICD-10-CM | POA: Diagnosis not present

## 2022-10-02 DIAGNOSIS — R7989 Other specified abnormal findings of blood chemistry: Secondary | ICD-10-CM

## 2022-10-02 DIAGNOSIS — E1122 Type 2 diabetes mellitus with diabetic chronic kidney disease: Secondary | ICD-10-CM

## 2022-10-02 DIAGNOSIS — E1169 Type 2 diabetes mellitus with other specified complication: Secondary | ICD-10-CM | POA: Diagnosis not present

## 2022-10-02 DIAGNOSIS — M1A09X0 Idiopathic chronic gout, multiple sites, without tophus (tophi): Secondary | ICD-10-CM

## 2022-10-02 MED ORDER — TADALAFIL 20 MG PO TABS: ORAL_TABLET | ORAL | 2 refills | Status: AC

## 2022-10-02 NOTE — Patient Instructions (Signed)

## 2022-10-03 LAB — COMPLETE METABOLIC PANEL WITH GFR
AG Ratio: 1.6 (calc) (ref 1.0–2.5)
ALT: 36 U/L (ref 9–46)
AST: 30 U/L (ref 10–35)
Albumin: 4.7 g/dL (ref 3.6–5.1)
Alkaline phosphatase (APISO): 59 U/L (ref 35–144)
BUN: 15 mg/dL (ref 7–25)
CO2: 27 mmol/L (ref 20–32)
Calcium: 9.8 mg/dL (ref 8.6–10.3)
Chloride: 101 mmol/L (ref 98–110)
Creat: 1.05 mg/dL (ref 0.70–1.28)
Globulin: 2.9 g/dL (calc) (ref 1.9–3.7)
Glucose, Bld: 112 mg/dL — ABNORMAL HIGH (ref 65–99)
Potassium: 4 mmol/L (ref 3.5–5.3)
Sodium: 138 mmol/L (ref 135–146)
Total Bilirubin: 0.5 mg/dL (ref 0.2–1.2)
Total Protein: 7.6 g/dL (ref 6.1–8.1)
eGFR: 76 mL/min/{1.73_m2} (ref >=60)

## 2022-10-03 LAB — CBC WITH DIFFERENTIAL/PLATELET
Absolute Monocytes: 670 cells/uL (ref 200–950)
Basophils Absolute: 94 cells/uL (ref 0–200)
Basophils Relative: 1.3 %
Eosinophils Absolute: 50 cells/uL (ref 15–500)
Eosinophils Relative: 0.7 %
HCT: 44.1 % (ref 38.5–50.0)
Hemoglobin: 15 g/dL (ref 13.2–17.1)
Lymphs Abs: 1534 cells/uL (ref 850–3900)
MCH: 31.3 pg (ref 27.0–33.0)
MCHC: 34 g/dL (ref 32.0–36.0)
MCV: 92.1 fL (ref 80.0–100.0)
MPV: 10.6 fL (ref 7.5–12.5)
Monocytes Relative: 9.3 %
Neutro Abs: 4853 cells/uL (ref 1500–7800)
Neutrophils Relative %: 67.4 %
Platelets: 282 10*3/uL (ref 140–400)
RBC: 4.79 10*6/uL (ref 4.20–5.80)
RDW: 12.9 % (ref 11.0–15.0)
Total Lymphocyte: 21.3 %
WBC: 7.2 10*3/uL (ref 3.8–10.8)

## 2022-10-03 LAB — LIPID PANEL
Cholesterol: 155 mg/dL (ref <200)
HDL: 34 mg/dL — ABNORMAL LOW (ref >=40)
LDL Cholesterol (Calc): 74 mg/dL (calc)
Non-HDL Cholesterol (Calc): 121 mg/dL (calc) (ref <130)
Total CHOL/HDL Ratio: 4.6 (calc) (ref <5.0)
Triglycerides: 392 mg/dL — ABNORMAL HIGH (ref <150)

## 2022-10-03 LAB — HEMOGLOBIN A1C
Hgb A1c MFr Bld: 6.5 % of total Hgb — ABNORMAL HIGH (ref <5.7)
Mean Plasma Glucose: 140 mg/dL
eAG (mmol/L): 7.7 mmol/L

## 2022-10-12 ENCOUNTER — Ambulatory Visit (INDEPENDENT_AMBULATORY_CARE_PROVIDER_SITE_OTHER): Payer: Medicare HMO | Admitting: Nurse Practitioner

## 2022-10-12 ENCOUNTER — Encounter: Payer: Self-pay | Admitting: Nurse Practitioner

## 2022-10-12 ENCOUNTER — Ambulatory Visit
Admission: EM | Admit: 2022-10-12 | Discharge: 2022-10-12 | Disposition: A | Discharge status: Home / Self Care | Payer: Medicare HMO | Attending: Internal Medicine | Admitting: Internal Medicine

## 2022-10-12 ENCOUNTER — Other Ambulatory Visit: Payer: Self-pay

## 2022-10-12 ENCOUNTER — Other Ambulatory Visit: Payer: Self-pay | Admitting: Nurse Practitioner

## 2022-10-12 VITALS — HR 105 | Temp 97.7°F

## 2022-10-12 DIAGNOSIS — R051 Acute cough: Secondary | ICD-10-CM

## 2022-10-12 DIAGNOSIS — R0602 Shortness of breath: Secondary | ICD-10-CM

## 2022-10-12 DIAGNOSIS — Z1152 Encounter for screening for COVID-19: Secondary | ICD-10-CM

## 2022-10-12 DIAGNOSIS — E1122 Type 2 diabetes mellitus with diabetic chronic kidney disease: Secondary | ICD-10-CM | POA: Diagnosis not present

## 2022-10-12 DIAGNOSIS — R6889 Other general symptoms and signs: Secondary | ICD-10-CM

## 2022-10-12 DIAGNOSIS — J069 Acute upper respiratory infection, unspecified: Secondary | ICD-10-CM

## 2022-10-12 LAB — POCT INFLUENZA A/B
Influenza A, POC: NEGATIVE
Influenza B, POC: NEGATIVE

## 2022-10-12 LAB — POC COVID19 BINAXNOW: SARS Coronavirus 2 Ag: POSITIVE — AB

## 2022-10-12 MED ORDER — ALBUTEROL SULFATE HFA 108 (90 BASE) MCG/ACT IN AERS: 2.0000 | INHALATION_SPRAY | Freq: Four times a day (QID) | RESPIRATORY_TRACT | 0 refills | Status: AC | PRN

## 2022-10-12 MED ORDER — MOLNUPIRAVIR EUA 200MG CAPSULE: 4.0000 | ORAL_CAPSULE | Freq: Two times a day (BID) | ORAL | 0 refills | Status: AC

## 2022-10-12 MED ORDER — PROMETHAZINE-DM 6.25-15 MG/5ML PO SYRP: 5.0000 mL | ORAL_SOLUTION | Freq: Four times a day (QID) | ORAL | 0 refills | Status: AC | PRN

## 2022-10-12 MED ORDER — BENZONATATE 100 MG PO CAPS: 100.0000 mg | ORAL_CAPSULE | Freq: Three times a day (TID) | ORAL | 0 refills | Status: AC | PRN

## 2022-10-12 NOTE — ED Notes (Signed)
Pt declined covid testing during this encounter

## 2022-10-12 NOTE — Discharge Instructions (Signed)
It appears that you have a viral upper respiratory infection which should run its course and self resolve with symptomatic treatment as we discussed.  I have prescribed you a cough medication to take as needed.  Please use saline nasal spray and humidifier as we discussed for nosebleeds.  Covid test is pending.  We will call if it is positive.  Follow-up if symptoms persist or worsen.

## 2022-10-12 NOTE — ED Triage Notes (Signed)
Pt c/o cough, nasal drainage, headache   Onset ~ Friday

## 2022-10-12 NOTE — ED Provider Notes (Addendum)
[facility]    CSN: [id number] Arrival date & time: 10/12/22  0801      History   Chief Complaint Chief Complaint  Patient presents with   Cough    HPI Roger Mcclain is a 72 y.o. male.   Patient presents with cough, nasal drainage, headache that has been present for about 3 days.  Patient reports that his wife and daughter have had similar symptoms and got "antibiotics for them".  Denies sore throat, ear pain, chest pain, shortness of breath, nausea, vomiting, diarrhea, abdominal pain.  Denies any associated fever.  He does report that he has been having intermittent epistaxis since symptoms started as well. Does not take any blood thinning medications.  Patient has not taken any medications to help alleviate symptoms.  Denies history of asthma or COPD.  Patient does report that he smoked cigarettes approximately 40 years ago but does not currently smoke cigarettes.   Cough   Past Medical History:  Diagnosis Date   Diabetes mellitus without complication ([place]) 2505   Fatty liver    Hyperlipidemia 2017   Hypertension 2010    Patient Active Problem List   Diagnosis Date Noted   Cyst of left kidney 5.5 CM on U/S 10/2021 repeat in 1 year to check stability 10/15/2021   History of COVID-19 10/31/2019   CKD stage 1 due to type 2 diabetes mellitus ([place]) 08/16/2019   Elevated LFTs 07/22/2018   Gout of multiple sites 07/20/2018   Morbid obesity ([place]) 10/01/2017   Hyperlipidemia associated with type 2 diabetes mellitus ([place]) 12/27/2016   Vitamin D deficiency 12/27/2016   Diabetes mellitus ([place]) 12/24/2016   Essential hypertension 12/24/2016    History reviewed. No pertinent surgical history.     Home Medications    Prior to Admission medications   Medication Sig Start Date End Date Taking? Authorizing Provider  benzonatate (TESSALON) 100 MG capsule Take 1 capsule (100 mg total) by mouth every 8 (eight) hours as needed for cough. 10/12/22  Yes Malana Eberwein, Michele Rockers,  FNP  Accu-Chek Softclix Lancets lancets Use once daily with Accu-Chek meter to check BS reading. Dx. E11.9 01/15/22   Unk Pinto, MD  allopurinol (ZYLOPRIM) 300 MG tablet Take 1 tablet daily to prevent Gout Attack 07/28/22   Unk Pinto, MD  Ascorbic Acid (VITAMIN C) 1000 MG tablet Take 1,000 mg by mouth daily.    [provider]  Blood Glucose Monitoring Suppl (ACCU-CHEK AVIVA PLUS) w/Device KIT Use daily to check BS.  Dx. E11.9 01/15/22   Unk Pinto, MD  diphenhydramine-acetaminophen (TYLENOL PM) 25-500 MG TABS tablet Take 1 tablet by mouth at bedtime as needed.    [provider]  empagliflozin (JARDIANCE) 10 MG TABS tablet Take 1 tablet (10 mg total) by mouth daily before breakfast. 05/01/22   Darrol Jump, NP  fenofibrate micronized (LOFIBRA) 134 MG capsule TAKE 1 CAPSULE DAILY FOR TRIGLYCERIDES (BLOOD FATS) 03/09/22   Alycia Rossetti, NP  gabapentin (NEURONTIN) 300 MG capsule TAKE  2 CAPSULES  AT BEDTIME FOR SLEEP 06/04/22   Alycia Rossetti, NP  glucose blood test strip Use 3 x/day before Meals with Accu-Chek meter to check BS.   ( Dx:  e11.9 ) 03/17/22   Unk Pinto, MD  hydrOXYzine (ATARAX) 50 MG tablet TAKE  1 TO 2 TABLETS  1 OR 2 HOURS  BEFORE BEDTIME  AS NEEDED  FOR SLEEP 03/09/22   Alycia Rossetti, NP  losartan-hydrochlorothiazide (HYZAAR) 100-25 MG tablet TAKE 1 TABLET EVERY  DAY FOR BLOOD PRESSURE 03/09/22   Alycia Rossetti, NP  Multiple Vitamin (MULTIVITAMIN) tablet Take 1 tablet by mouth daily.    [provider]  rosuvastatin (CRESTOR) 40 MG tablet TAKE 1 TABLET EVERY DAY FOR CHOLESTEROL 03/16/22   Darrol Jump, NP  tadalafil (CIALIS) 20 MG tablet Take 1/2 to 1 tablet every 2 to 3 days as needed for XXXX 10/02/22   Alycia Rossetti, NP  Vitamin D, Ergocalciferol, (DRISDOL) 1.25 MG (50000 UNIT) CAPS capsule Take 50,000 Units by mouth. Takes 1 capsule every other day    [provider]  zinc gluconate 50 MG tablet Take 50 mg  by mouth daily.    [provider]    Family History Family History  Problem Relation Age of Onset   Hypertension Mother    Heart disease Mother    Diabetes Father    Hyperlipidemia Father    Hypertension Father     Social History Social History   Tobacco Use   Smoking status: Never   Smokeless tobacco: Never  Substance Use Topics   Alcohol use: Yes   Drug use: No     Allergies   Morphine   Review of Systems Review of Systems Per HPI  Physical Exam Triage Vital Signs ED Triage Vitals  Enc Vitals Group     BP 10/12/22 0818 (!) 145/76     Pulse Rate 10/12/22 0817 98     Resp 10/12/22 0817 18     Temp 10/12/22 0817 98.5 F (36.9 C)     Temp Source 10/12/22 0817 Oral     SpO2 10/12/22 0817 97 %     Weight --      Height --      Head Circumference --      Peak Flow --      Pain Score 10/12/22 0817 7     Pain Loc --      Pain Edu? --      Excl. in [place]? --    No data found.  Updated Vital Signs BP (!) 145/76 (BP Location: Left Arm)   Pulse 98   Temp 98.5 F (36.9 C) (Oral)   Resp 18   SpO2 97%   Visual Acuity Right Eye Distance:   Left Eye Distance:   Bilateral Distance:    Right Eye Near:   Left Eye Near:    Bilateral Near:     Physical Exam Constitutional:      General: He is not in acute distress.    Appearance: Normal appearance. He is not toxic-appearing or diaphoretic.  HENT:     Head: Normocephalic and atraumatic.     Right Ear: Tympanic membrane and ear canal normal.     Left Ear: Tympanic membrane and ear canal normal.     Nose: Congestion present.     Mouth/Throat:     Mouth: Mucous membranes are moist.     Pharynx: No posterior oropharyngeal erythema.  Eyes:     Extraocular Movements: Extraocular movements intact.     Conjunctiva/sclera: Conjunctivae normal.     Pupils: Pupils are equal, round, and reactive to light.  Cardiovascular:     Rate and Rhythm: Normal rate and regular rhythm.     Pulses: Normal pulses.      Heart sounds: Normal heart sounds.  Pulmonary:     Effort: Pulmonary effort is normal. No respiratory distress.     Breath sounds: Normal breath sounds. No stridor. No wheezing, rhonchi or rales.  Abdominal:     General: Abdomen is flat. Bowel sounds are normal.     Palpations: Abdomen is soft.  Musculoskeletal:        General: Normal range of motion.     Cervical back: Normal range of motion.  Skin:    General: Skin is warm and dry.  Neurological:     General: No focal deficit present.     Mental Status: He is alert and oriented to person, place, and time. Mental status is at baseline.  Psychiatric:        Mood and Affect: Mood normal.        Behavior: Behavior normal.      UC Treatments / Results  Labs (all labs ordered are listed, but only abnormal results are displayed) Labs Reviewed  SARS CORONAVIRUS 2 (TAT 6-24 HRS)    EKG   Radiology No results found.  Procedures Procedures (including critical care time)  Medications Ordered in UC Medications - No data to display  Initial Impression / Assessment and Plan / UC Course  I have reviewed the triage vital signs and the nursing notes.  Pertinent labs & imaging results that were available during my care of the patient were reviewed by me and considered in my medical decision making (see chart for details).     Patient presents with symptoms likely from a viral upper respiratory infection. Do not suspect underlying cardiopulmonary process. Symptoms seem unlikely related to ACS, CHF or COPD exacerbations, pneumonia, pneumothorax. Patient is nontoxic appearing and not in need of emergent medical intervention.  There are no adventitious lung sounds on exam so do not think that chest imaging is necessary.  No concern for secondary bacterial infection due to this as well.  Recommended symptom control with medications and supportive care.  Benzonatate prescribed to take as needed.  Discussed saline nasal spray and using  humidifier for intermittent epistaxis.  No current nosebleed at this time.  Suspect intermittent epistaxis is due to upper respiratory inflammation.  Return if symptoms fail to improve in 1-2 weeks or you develop shortness of breath, chest pain, severe headache. Patient states understanding and is agreeable.  Discharged with PCP followup.   While nurse was discharging patient, he advised that the patient had more questions.  Therefore, I went back into the exam room to talk to patient.  He stated that he did not want a cough medication but wanted antibiotic therapy.  I discussed with patient that this is a viral infection and antibiotics do not treat viral illnesses.  I also discussed with him there is no indication for antibiotic therapy or concern for secondary bacterial infection on his physical exam.  I also discussed with him that given he has known sick contacts, this also points toward viral illness.  After this discussion, patient was still adamant that he needed antibiotic therapy and became very angry.  Then, he stated that he no longer wanted "anything else from [place]" and stood up to walk out of urgent care.  COVID test was ordered but had not yet been completed.  I asked patient if he wanted [place] to complete the covid test given that he was originally agreeable to it.  He stated that he did not want the COVID test and he wanted it to be "thrown away".  Therefore, patient refused covid test.  Final Clinical Impressions(s) / UC Diagnoses   Final diagnoses:  Viral upper respiratory tract infection with cough     Discharge Instructions  It appears that you have a viral upper respiratory infection which should run its course and self resolve with symptomatic treatment as we discussed.  I have prescribed you a cough medication to take as needed.  Please use saline nasal spray and humidifier as we discussed for nosebleeds.  Covid test is pending.  We will call if it is positive.  Follow-up if  symptoms persist or worsen.    ED Prescriptions     Medication Sig Dispense Auth. Provider   benzonatate (TESSALON) 100 MG capsule Take 1 capsule (100 mg total) by mouth every 8 (eight) hours as needed for cough. 21 capsule [place], Michele Rockers, [place]      PDMP not reviewed this encounter.   Teodora Medici, [place] 10/12/22 0900    Teodora Medici, [place] 10/12/22 1043

## 2022-10-12 NOTE — Progress Notes (Signed)
THIS ENCOUNTER IS A VIRTUAL VISIT DUE TO COVID-19 - PATIENT WAS NOT SEEN IN THE OFFICE.  PATIENT HAS CONSENTED TO VIRTUAL VISIT / TELEMEDICINE VISIT   Virtual Visit via telephone Note  I connected with  Roger Mcclain on 10/12/2022 by telephone.  I verified that I am speaking with the correct person using two identifiers.    I discussed the limitations of evaluation and management by telemedicine and the availability of in person appointments. The patient expressed understanding and agreed to proceed.  History of Present Illness:  Pulse (!) 105   Temp 97.7 F (36.5 C)   SpO2 98%   72 y.o. patient contacted office reporting URI sx HA, fatigue, cough, SOB, nasal congestion, chills, weakness. he tested positive by POC rapid test in office. OV was conducted by telephone to minimize exposure. This patient has been vaccinated for covid 19, last 01/2020.  He was recently seen at [facility] and prescribed Benzonatate.   Sx began 3 days ago with HA  Treatments tried so far: None  Exposures: CLT/[place]   Medications  Current Outpatient Medications (Endocrine & Metabolic):    empagliflozin (JARDIANCE) 10 MG TABS tablet, Take 1 tablet (10 mg total) by mouth daily before breakfast.  Current Outpatient Medications (Cardiovascular):    fenofibrate micronized (LOFIBRA) 134 MG capsule, TAKE 1 CAPSULE DAILY FOR TRIGLYCERIDES (BLOOD FATS)   losartan-hydrochlorothiazide (HYZAAR) 100-25 MG tablet, TAKE 1 TABLET EVERY DAY FOR BLOOD PRESSURE   rosuvastatin (CRESTOR) 40 MG tablet, TAKE 1 TABLET EVERY DAY FOR CHOLESTEROL   tadalafil (CIALIS) 20 MG tablet, Take 1/2 to 1 tablet every 2 to 3 days as needed for XXXX  Current Outpatient Medications (Respiratory):    benzonatate (TESSALON) 100 MG capsule, Take 1 capsule (100 mg total) by mouth every 8 (eight) hours as needed for cough.  Current Outpatient Medications (Analgesics):    allopurinol (ZYLOPRIM) 300 MG tablet, Take 1 tablet daily to prevent Gout  Attack   Current Outpatient Medications (Other):    Accu-Chek Softclix Lancets lancets, Use once daily with Accu-Chek meter to check BS reading. Dx. E11.9   Ascorbic Acid (VITAMIN C) 1000 MG tablet, Take 1,000 mg by mouth daily.   Blood Glucose Monitoring Suppl (ACCU-CHEK AVIVA PLUS) w/Device KIT, Use daily to check BS.  Dx. E11.9   diphenhydramine-acetaminophen (TYLENOL PM) 25-500 MG TABS tablet, Take 1 tablet by mouth at bedtime as needed.   gabapentin (NEURONTIN) 300 MG capsule, TAKE  2 CAPSULES  AT BEDTIME FOR SLEEP   glucose blood test strip, Use 3 x/day before Meals with Accu-Chek meter to check BS.   ( Dx:  e11.9 )   hydrOXYzine (ATARAX) 50 MG tablet, TAKE  1 TO 2 TABLETS  1 OR 2 HOURS  BEFORE BEDTIME  AS NEEDED  FOR SLEEP   Multiple Vitamin (MULTIVITAMIN) tablet, Take 1 tablet by mouth daily.   Vitamin D, Ergocalciferol, (DRISDOL) 1.25 MG (50000 UNIT) CAPS capsule, Take 50,000 Units by mouth. Takes 1 capsule every other day   zinc gluconate 50 MG tablet, Take 50 mg by mouth daily.  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   Social History:   reports that he has never smoked. He has never used smokeless tobacco. He reports current alcohol use. He reports that he does not use drugs.  Observations/Objective:  General : Well sounding patient in no apparent distress HEENT: no hoarseness, no cough for duration of visit Lungs: speaks in complete sentences, no audible wheezing, no apparent distress Neurological: alert, oriented x 3 Psychiatric: pleasant, judgement appropriate    Assessment and Plan:  Covid 19 Covid 19 positive per rapid screening test in office Risk factors include: HTN, DM Symptoms are: mild Due to co morbid conditions and risk factors, discussed antivirals Molnupiravir Immue support reviewed Vitamin C, Zinc, Vitamin D Take tylenol PRN temp 101+ Push hydration Regular ambulation or calf exercises exercises for clot prevention and 81 mg ASA unless contraindicated Sx supportive therapy suggested Follow up via mychart or telephone if needed Advised patient obtain O2 monitor; present to ED if persistently <90% or with severe dyspnea, CP, fever uncontrolled by tylenol, confusion, sudden decline       Should remain in isolation 5 days from testing positive and then wear a mask when around other people for the following 5 days  1. Flu-like symptoms Negative  - POCT Influenza A/B  2. Encounter for screening for COVID-19 POSITIVE  - POC COVID-19 - molnupiravir EUA (LAGEVRIO) 200 mg CAPS capsule; Take 4 capsules (800 mg total) by mouth 2 (two) times daily for 5 days.  Dispense: 40 capsule; Refill: 0  3. Acute cough  - promethazine-dextromethorphan (PROMETHAZINE-DM) 6.25-15 MG/5ML syrup; Take 5 mLs by mouth 4 (four) times daily as needed for cough.  Dispense: 240 mL; Refill: 0  4. SOB (shortness of breath)  - albuterol (VENTOLIN HFA) 108 (90 Base) MCG/ACT inhaler; Inhale 2 puffs into the lungs every 6 (six) hours as needed for wheezing or shortness of breath.  Dispense: 8 g; Refill: 0   Follow Up Instructions:  I discussed the assessment and treatment plan with the patient. The patient was provided an opportunity to ask questions and all were answered. The patient agreed with the plan and demonstrated an understanding of the instructions.   The patient was advised to call back or seek an in-person evaluation if the symptoms worsen or if the condition fails to improve as anticipated.  I provided 15 minutes of non-face-to-face time during  this encounter.   Darrol Jump, NP

## 2022-10-21 ENCOUNTER — Telehealth: Payer: Self-pay | Admitting: Nurse Practitioner

## 2022-10-21 NOTE — Telephone Encounter (Signed)
Pt was seen on 01/08 and said his nose is still running. He also said he told her that he has been having nose bleeding for a couple months, wanting either or a referral or to know if there is a medication to help that

## 2022-10-22 ENCOUNTER — Other Ambulatory Visit: Payer: Self-pay | Admitting: Nurse Practitioner

## 2022-10-22 ENCOUNTER — Telehealth: Payer: Self-pay

## 2022-10-22 DIAGNOSIS — R040 Epistaxis: Secondary | ICD-10-CM

## 2022-10-22 NOTE — Telephone Encounter (Signed)
I spoke to the patient to address his nose bleeding issue. The patient was informed that at his last interaction with this clinic he had a telephone visit for his positive COVID status. The patient acknowledged that medication for his symptoms of COVID  were sent in for him and he picked up the cough syrup and chose not to pick up the other medication due to what he felt was an unreasonable cost. The patient was informed that we have no record of further contact from him that the medication he chose not to pick up needed to be changed. He informed that he was told by the pharmacy that a change request was made. I informed the patient that for the safety of the patient that it is customary for the patient to request a new medication due to feasibility, previous interactions or failed therapy. The patient did not contact this office with a medication change request. The patient inquired if he could receive a medication for his ongoing nose bleeding that he felt he had been treated for at his visit in December. I explained to the patient that the provider would review his chart and determine if medication could be sent in or if another visit and/or referral would be appropriate for his care. The patient voiced understanding to this clinic staff.

## 2022-10-29 DIAGNOSIS — Z0000 Encounter for general adult medical examination without abnormal findings: Secondary | ICD-10-CM | POA: Diagnosis not present

## 2022-10-29 DIAGNOSIS — E119 Type 2 diabetes mellitus without complications: Secondary | ICD-10-CM | POA: Diagnosis not present

## 2022-11-03 ENCOUNTER — Other Ambulatory Visit: Payer: Self-pay | Admitting: Nurse Practitioner

## 2022-11-03 ENCOUNTER — Telehealth: Payer: Self-pay | Admitting: Nurse Practitioner

## 2022-11-03 MED ORDER — EMPAGLIFLOZIN 10 MG PO TABS: 10.0000 mg | ORAL_TABLET | Freq: Every day | ORAL | 0 refills | Status: AC

## 2022-11-03 NOTE — Telephone Encounter (Signed)
Patient is requesting a refill on Jardiance to [facility].

## 2022-11-03 NOTE — Addendum Note (Signed)
Addended by: Chancy Hurter on: 11/03/2022 03:04 PM   Modules accepted: Orders

## 2023-01-01 ENCOUNTER — Ambulatory Visit: Payer: Medicare HMO | Admitting: Internal Medicine

## 2023-03-05 ENCOUNTER — Ambulatory Visit: Payer: Medicare HMO | Admitting: Nurse Practitioner

## 2023-03-14 IMAGING — US US ABDOMEN COMPLETE
1 series · 13 of 25 positions shown · non-contrast
Comparison: None.

CLINICAL DATA: Elevated LFTs

EXAM:
ABDOMEN ULTRASOUND COMPLETE

[Series 1: us abdomen complete · 13 of 100 slices shown]
[im 1/100]
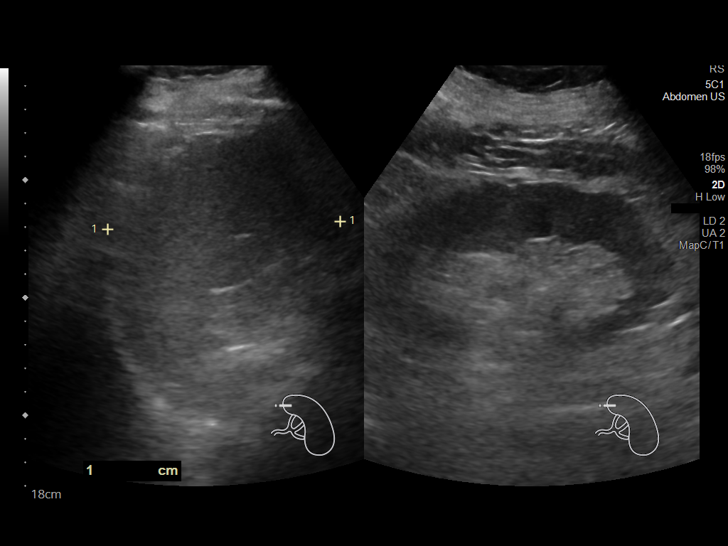
[im 9/100]
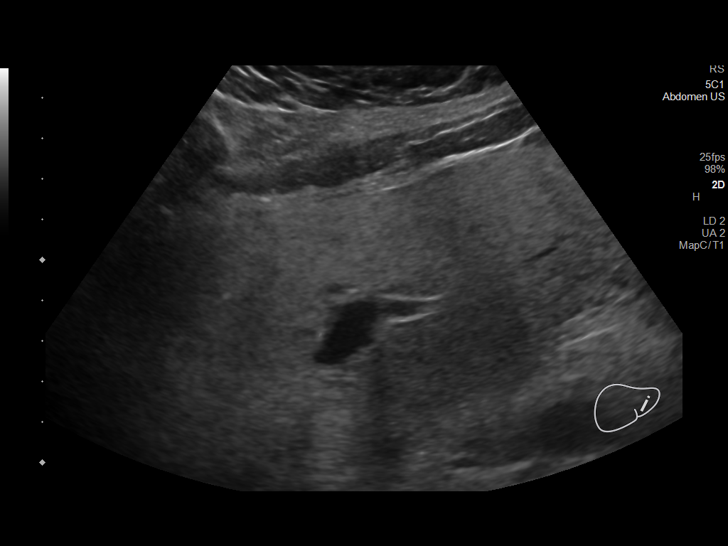
[im 17/100]
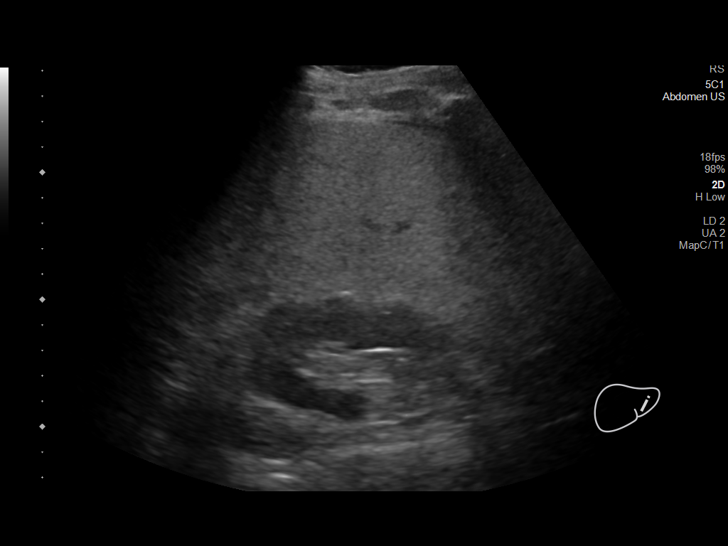
[im 25/100]
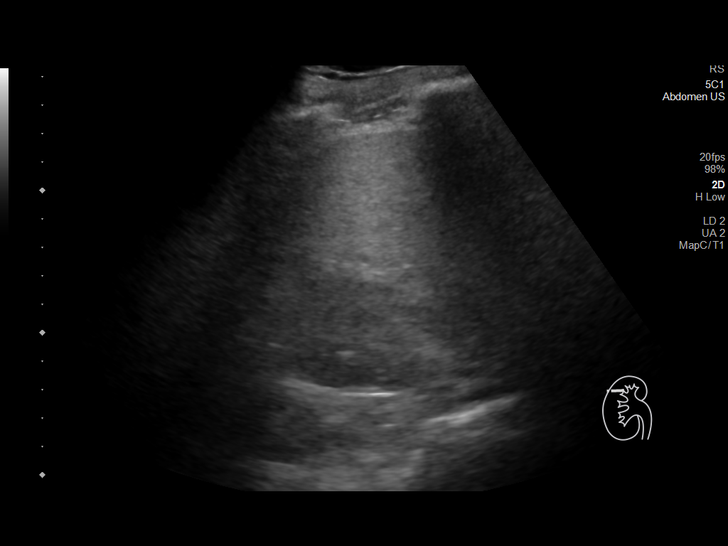
[im 34/100]
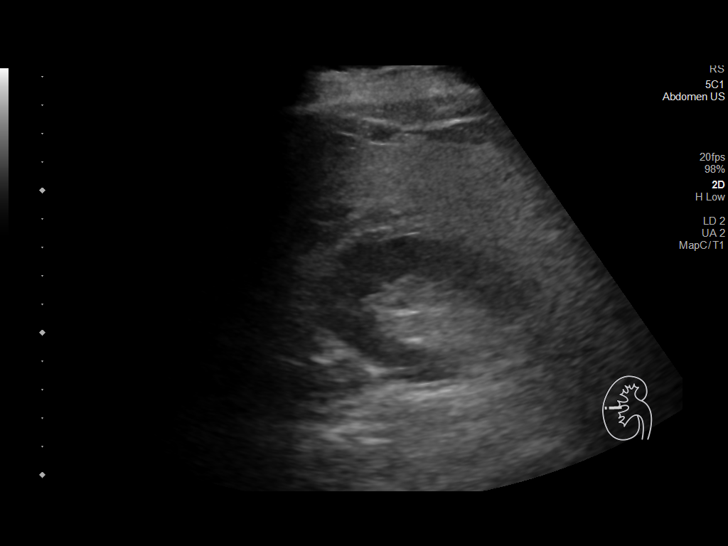
[im 42/100]
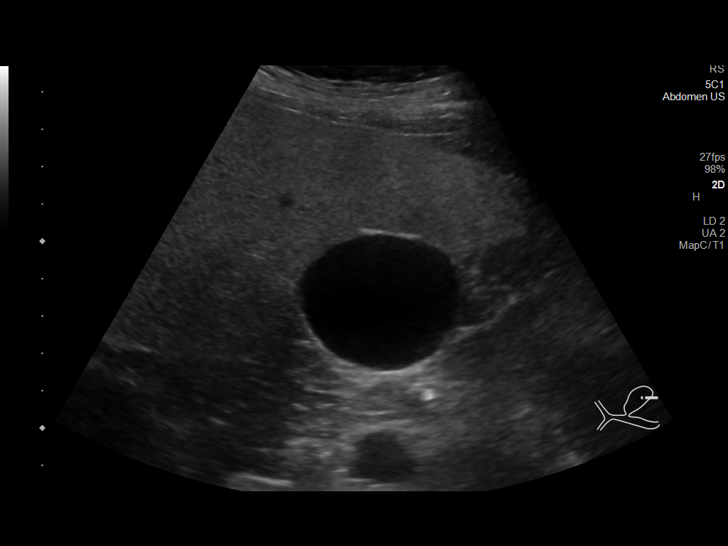
[im 50/100]
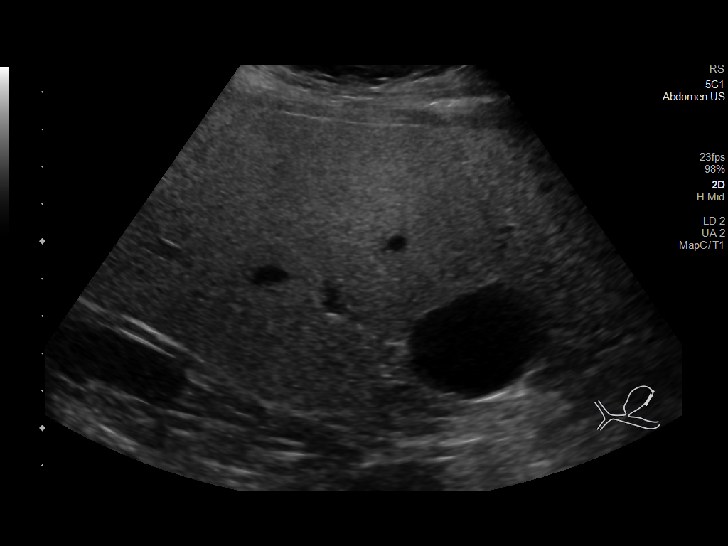
[im 58/100]
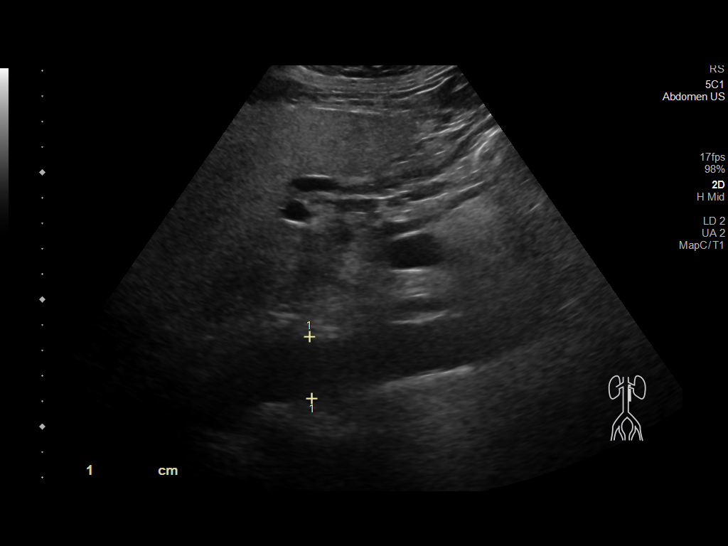
[im 67/100]
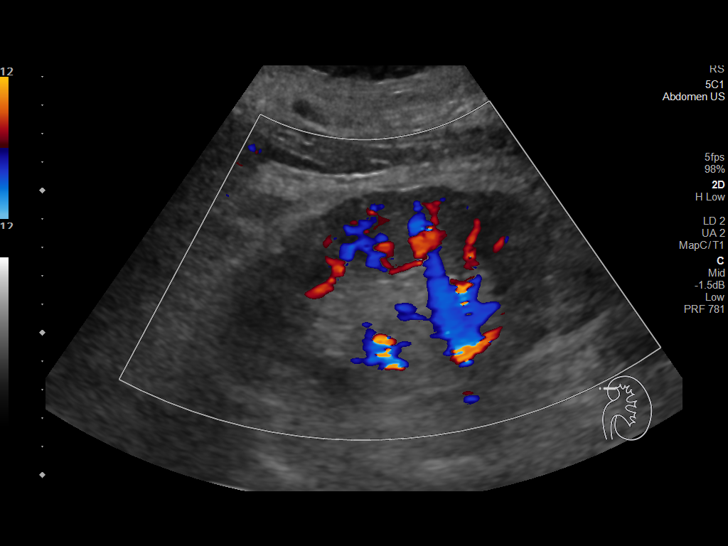
[im 75/100]
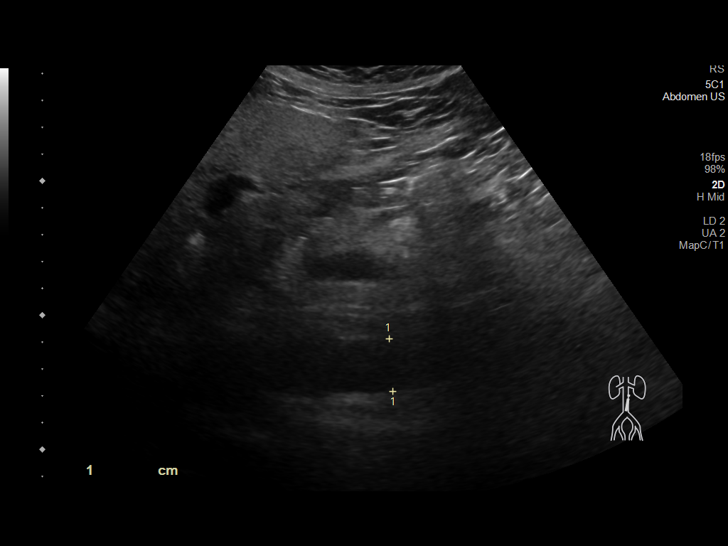
[im 83/100]
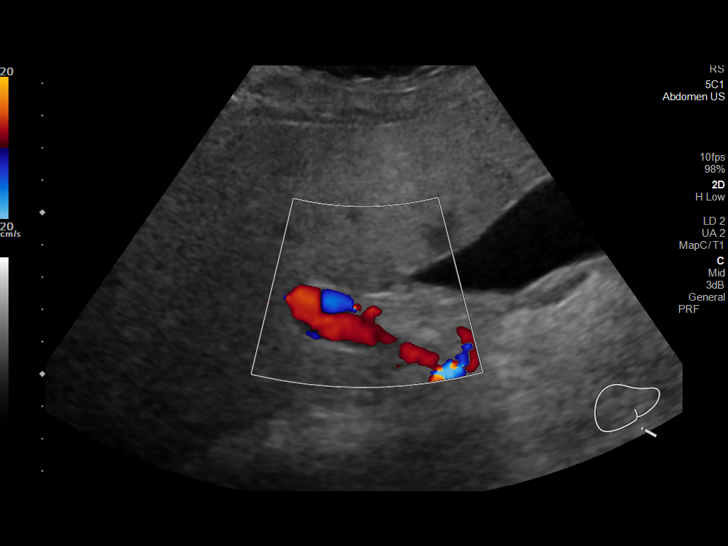
[im 91/100]
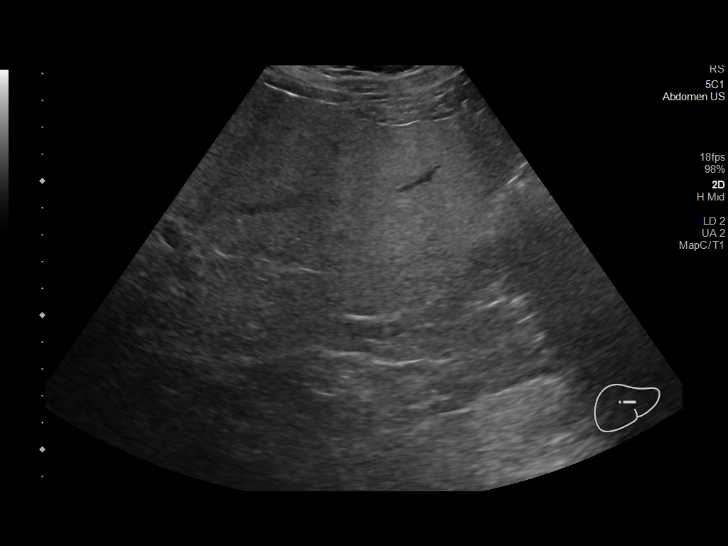
[im 100/100]
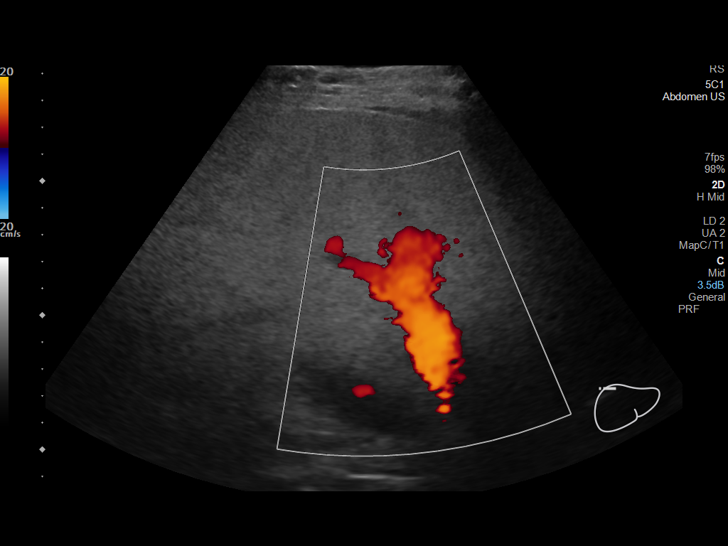

[13 of 25 positions shown; findings below may reference images not displayed]

FINDINGS: Gallbladder: No gallstones or wall thickening visualized. No
sonographic Murphy sign noted by sonographer.

Common bile duct: Diameter: 2 mm

Liver: There is diffuse increased echogenicity suggesting moderate
fatty infiltration. Adjacent to the gallbladder there is
well-circumscribed geographic decreased echogenicity that is
compatible with focal fatty sparing, common in this location. This
region measures up to approximately 1.1 x 0.8 x 1.8 cm. Portal vein
is patent on color Doppler imaging with normal direction of blood
flow towards the liver.

IVC: No abnormality visualized.

Pancreas: The pancreatic duct is measured at 2-3 mm in caliber.

Spleen: Size and appearance within normal limits.

Right Kidney: Length: 13.8 cm. Echogenicity within normal limits. No
mass or hydronephrosis visualized.

Left Kidney: Length: 14.1 cm. Echogenicity within normal limits. A
5.5 cm partially exophytic benign simple cyst is seen within the
left kidney. No mass or hydronephrosis visualized.

Abdominal aorta: No aneurysm visualized. The distal bifurcation was
not visualized secondary to overlying bowel gas.

Other findings: None.
IMPRESSION: 1. Normal appearance of the gallbladder.
2. Moderate hepatic steatosis. Within the gallbladder fossa of the
liver there is decreased echogenicity compatible with benign focal
fatty sparing.
3. Benign simple cyst measuring up to 5.5 cm within the left kidney.

## 2023-04-14 DIAGNOSIS — I10 Essential (primary) hypertension: Secondary | ICD-10-CM | POA: Diagnosis not present

## 2023-04-14 DIAGNOSIS — E119 Type 2 diabetes mellitus without complications: Secondary | ICD-10-CM | POA: Diagnosis not present

## 2023-07-01 ENCOUNTER — Encounter: Payer: Medicare HMO | Admitting: Internal Medicine

## 2023-07-23 DIAGNOSIS — H401132 Primary open-angle glaucoma, bilateral, moderate stage: Secondary | ICD-10-CM | POA: Diagnosis not present

## 2024-05-14 ENCOUNTER — Encounter: Payer: Self-pay | Admitting: Emergency Medicine

## 2024-05-14 ENCOUNTER — Ambulatory Visit
Admission: EM | Admit: 2024-05-14 | Discharge: 2024-05-14 | Disposition: A | Discharge status: Home / Self Care | Attending: Internal Medicine | Admitting: Internal Medicine

## 2024-05-14 DIAGNOSIS — L02414 Cutaneous abscess of left upper limb: Secondary | ICD-10-CM | POA: Diagnosis present

## 2024-05-14 MED ORDER — DOXYCYCLINE HYCLATE 100 MG PO CAPS: 100.0000 mg | ORAL_CAPSULE | Freq: Two times a day (BID) | ORAL | 0 refills | Status: AC

## 2024-05-14 NOTE — ED Provider Notes (Addendum)
 [facility]    CSN: [id number] Arrival date & time: 05/14/24  9176      History   Chief Complaint Chief Complaint  Patient presents with   Abscess    HPI Roger Mcclain is a 73 y.o. male.   73 year old male who presents urgent care with complaints of a left forearm abscess.  He reports that this started about a week ago.  He tried to put peroxide on the area but really could not get it to come to ahead.  It is very tender and has gotten worse.  He denies any fevers or chills.   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 No pertinent surgical history.     Home Medications    Prior to Admission medications   Medication Sig Start Date End Date Taking? Authorizing Provider  doxycycline  (VIBRAMYCIN ) 100 MG capsule Take 1 capsule (100 mg total) by mouth 2 (two) times daily for 7 days. 05/14/24 05/21/24 Yes Wilhemenia Camba A, PA-C  Accu-Chek Softclix Lancets lancets Use once daily with Accu-Chek meter to check BS reading. Dx. E11.9 01/15/22   Tonita Fallow, MD  albuterol  (VENTOLIN  HFA) 108 (90 Base) MCG/ACT inhaler Inhale 2 puffs into the lungs every 6 (six) hours as needed for wheezing or shortness of breath. 10/12/22   Cranford, Tonya, NP  allopurinol   (ZYLOPRIM ) 300 MG tablet Take 1 tablet daily to prevent Gout Attack 07/28/22   Tonita Fallow, MD  Ascorbic Acid (VITAMIN C) 1000 MG tablet Take 1,000 mg by mouth daily.    [provider]  benzonatate  (TESSALON ) 100 MG capsule Take 1 capsule (100 mg total) by mouth every 8 (eight) hours as needed for cough. 10/12/22   Hazen Darryle BRAVO, FNP  Blood Glucose Monitoring Suppl (ACCU-CHEK AVIVA PLUS) w/Device KIT Use daily to check BS.  Dx. E11.9 01/15/22   Tonita Fallow, MD  diphenhydramine-acetaminophen (TYLENOL PM) 25-500 MG TABS tablet Take 1 tablet by mouth at bedtime as needed.    [provider]  empagliflozin  (JARDIANCE ) 10 MG TABS tablet Take 1 tablet (10 mg total) by mouth daily before breakfast. 11/03/22   Cranford, Tonya, NP  fenofibrate  micronized (LOFIBRA) 134 MG capsule TAKE 1 CAPSULE DAILY FOR TRIGLYCERIDES (BLOOD FATS) 03/09/22   Wilkinson, Dana E, NP  gabapentin  (NEURONTIN ) 300 MG capsule TAKE  2 CAPSULES  AT BEDTIME FOR SLEEP 06/04/22   Wilkinson, Dana E, NP  glucose blood test strip Use 3 x/day before Meals with Accu-Chek meter to check BS.   ( Dx:  e11.9 ) 03/17/22   Tonita Fallow, MD  hydrOXYzine  (ATARAX ) 50 MG tablet TAKE  1 TO 2 TABLETS  1 OR 2 HOURS  BEFORE BEDTIME  AS NEEDED  FOR SLEEP 03/09/22  Wilkinson, Dana E, NP  losartan -hydrochlorothiazide  (HYZAAR) 100-25 MG tablet TAKE 1 TABLET EVERY DAY FOR BLOOD PRESSURE 03/09/22   Wilkinson, Dana E, NP  Multiple Vitamin (MULTIVITAMIN) tablet Take 1 tablet by mouth daily.    [provider]  promethazine -dextromethorphan (PROMETHAZINE -DM) 6.25-15 MG/5ML syrup Take 5 mLs by mouth 4 (four) times daily as needed for cough. 10/12/22   Cranford, Tonya, NP  rosuvastatin  (CRESTOR ) 40 MG tablet TAKE 1 TABLET EVERY DAY FOR CHOLESTEROL 03/16/22   Cranford, Tonya, NP  tadalafil  (CIALIS ) 20 MG tablet Take 1/2 to 1 tablet every 2 to 3 days as needed for XXXX 10/02/22   Wilkinson, Dana E, NP  Vitamin D , Ergocalciferol , (DRISDOL) 1.25  MG (50000 UNIT) CAPS capsule Take 50,000 Units by mouth. Takes 1 capsule every other day    [provider]  zinc gluconate 50 MG tablet Take 50 mg by mouth daily.    [provider]    Family History Family History  Problem Relation Age of Onset   Hypertension Mother    Heart disease Mother    Diabetes Father    Hyperlipidemia Father    Hypertension Father     Social History Social History   Tobacco Use   Smoking status: Never    Passive exposure: Never   Smokeless tobacco: Never  Vaping Use   Vaping status: Never Used  Substance Use Topics   Alcohol use: Yes   Drug use: No     Allergies   Morphine   Review of Systems Review of Systems  Constitutional:  Negative for chills and fever.  HENT:  Negative for ear pain and sore throat.   Eyes:  Negative for pain and visual disturbance.  Respiratory:  Negative for cough and shortness of breath.   Cardiovascular:  Negative for chest pain and palpitations.  Gastrointestinal:  Negative for abdominal pain and vomiting.  Genitourinary:  Negative for dysuria and hematuria.  Musculoskeletal:  Negative for arthralgias and back pain.  Skin:  Negative for color change and rash.       Abscess left forearm  Neurological:  Negative for seizures and syncope.  All other systems reviewed and are negative.    Physical Exam Triage Vital Signs ED Triage Vitals  Encounter Vitals Group     BP 05/14/24 1006 121/78     Girls Systolic BP Percentile --      Girls Diastolic BP Percentile --      Boys Systolic BP Percentile --      Boys Diastolic BP Percentile --      Pulse Rate 05/14/24 1006 78     Resp 05/14/24 1006 18     Temp 05/14/24 1006 98 F (36.7 C)     Temp Source 05/14/24 1006 Oral     SpO2 05/14/24 1006 97 %     Weight 05/14/24 1005 253 lb 4.9 oz (114.9 kg)     Height --      Head Circumference --      Peak Flow --      Pain Score 05/14/24 1005 6     Pain Loc --      Pain Education --      Exclude  from Growth Chart --    No data found.  Updated Vital Signs BP 121/78 (BP Location: Right Arm)   Pulse 78   Temp 98 F (36.7 C) (Oral)   Resp 18   Wt 253 lb 4.9 oz (114.9 kg)   SpO2 97%  BMI 32.52 kg/m   Visual Acuity Right Eye Distance:   Left Eye Distance:   Bilateral Distance:    Right Eye Near:   Left Eye Near:    Bilateral Near:     Physical Exam Vitals and nursing note reviewed.  Constitutional:      General: He is not in acute distress.    Appearance: He is well-developed.  HENT:     Head: Normocephalic and atraumatic.  Eyes:     Conjunctiva/sclera: Conjunctivae normal.  Cardiovascular:     Rate and Rhythm: Normal rate and regular rhythm.     Heart sounds: No murmur heard. Pulmonary:     Effort: Pulmonary effort is normal. No respiratory distress.     Breath sounds: Normal breath sounds.  Musculoskeletal:        General: No swelling.     Cervical back: Neck supple.  Skin:    General: Skin is warm and dry.     Capillary Refill: Capillary refill takes less than 2 seconds.         Comments: Neurovascularly intact with palpable radial and ulnar pulses  Neurological:     Mental Status: He is alert.  Psychiatric:        Mood and Affect: Mood normal.        Behavior: Behavior normal.      UC Treatments / Results  Labs (all labs ordered are listed, but only abnormal results are displayed) Labs Reviewed  AEROBIC CULTURE W GRAM STAIN (SUPERFICIAL SPECIMEN)    EKG   Radiology No results found.  Procedures Incision and Drainage  Date/Time: 05/14/2024 10:38 AM  Performed by: Teresa Almarie LABOR, PA-C Authorized by: Teresa Almarie LABOR, PA-C   Consent:    Consent obtained:  Verbal   Consent given by:  Patient   Risks discussed:  Bleeding, incomplete drainage, pain and damage to other organs   Alternatives discussed:  No treatment Universal protocol:    Procedure explained and questions answered to patient or proxy's satisfaction: yes      Relevant documents present and verified: yes     Site/side marked: yes     Immediately prior to procedure, a time out was called: yes     Patient identity confirmed:  Verbally with patient Location:    Type:  Abscess Pre-procedure details:    Skin preparation:  Betadine Anesthesia:    Anesthesia method:  Local infiltration   Local anesthetic:  Lidocaine 1% WITH epi Procedure type:    Complexity:  Simple Procedure details:    Incision types:  Cruciate   Incision depth:  Subcutaneous   Wound management:  Probed and deloculated, irrigated with saline and extensive cleaning   Drainage:  Purulent   Drainage amount:  Moderate   Packing materials:  None Post-procedure details:    Procedure completion:  Tolerated well, no immediate complications Comments:     Culture taken  (including critical care time)  Medications Ordered in UC Medications - No data to display  Initial Impression / Assessment and Plan / UC Course  I have reviewed the triage vital signs and the nursing notes.  Pertinent labs & imaging results that were available during my care of the patient were reviewed by me and considered in my medical decision making (see chart for details).     Abscess of forearm, left - Plan: Aerobic Culture w Gram Stain (superficial specimen), Aerobic Culture w Gram Stain (superficial specimen)   Incision and drainage of left forearm abscess.  Leave  the current bandage in place for at least 12 hours then may remove and wash the area with soap and water.  Replace with a clean dry dressing.  Recommend changing the dressing at least once a day but may change it more frequently if needed.  Okay to wash the area with soap and water but would not submerge the area completely underwater.  Take doxycycline  100 mg twice daily for 7 days.  This is an antibiotic.  We have done a culture of the wound and if there is any changes in the antibiotics needed we will contact you.  The area will likely take  anywhere from 7 to 14 days to completely heal.  Return to urgent care as needed.  Final Clinical Impressions(s) / UC Diagnoses   Final diagnoses:  Abscess of forearm, left     Discharge Instructions      Incision and drainage of left forearm abscess.  Leave the current bandage in place for at least 12 hours then may remove and wash the area with soap and water.  Replace with a clean dry dressing.  Recommend changing the dressing at least once a day but may change it more frequently if needed.  Okay to wash the area with soap and water but would not submerge the area completely underwater.  Take doxycycline  100 mg twice daily for 7 days.  This is an antibiotic.  We have done a culture of the wound and if there is any changes in the antibiotics needed we will contact you.  The area will likely take anywhere from 7 to 14 days to completely heal.  Return to urgent care as needed.     ED Prescriptions     Medication Sig Dispense Auth. Provider   doxycycline  (VIBRAMYCIN ) 100 MG capsule Take 1 capsule (100 mg total) by mouth 2 (two) times daily for 7 days. 14 capsule Teresa Almarie LABOR, [place]      PDMP not reviewed this encounter.   Teresa Almarie LABOR, PA-C 05/14/24 1059    Teresa Almarie LABOR, [place] 05/14/24 1100

## 2024-05-14 NOTE — ED Triage Notes (Signed)
 Pt presents c/o abscess on left forearm x 7 days. Pt reports th area is extremely tender to touch. Pt reports this is his first time having an abscess.

## 2024-05-14 NOTE — Discharge Instructions (Addendum)
 Incision and drainage of left forearm abscess.  Leave the current bandage in place for at least 12 hours then may remove and wash the area with soap and water.  Replace with a clean dry dressing.  Recommend changing the dressing at least once a day but may change it more frequently if needed.  Okay to wash the area with soap and water but would not submerge the area completely underwater.  Take doxycycline  100 mg twice daily for 7 days.  This is an antibiotic.  We have done a culture of the wound and if there is any changes in the antibiotics needed we will contact you.  The area will likely take anywhere from 7 to 14 days to completely heal.  Return to urgent care as needed.

## 2024-05-17 ENCOUNTER — Ambulatory Visit (HOSPITAL_COMMUNITY): Payer: Self-pay

## 2024-05-17 LAB — AEROBIC CULTURE W GRAM STAIN (SUPERFICIAL SPECIMEN)

## 2024-08-19 ENCOUNTER — Encounter: Payer: Self-pay | Admitting: *Deleted

## 2024-08-19 ENCOUNTER — Ambulatory Visit: Admission: EM | Admit: 2024-08-19 | Discharge: 2024-08-19 | Disposition: A | Discharge status: Home / Self Care

## 2024-08-19 DIAGNOSIS — J0140 Acute pansinusitis, unspecified: Secondary | ICD-10-CM | POA: Diagnosis not present

## 2024-08-19 MED ORDER — AMOXICILLIN-POT CLAVULANATE 875-125 MG PO TABS: 1.0000 | ORAL_TABLET | Freq: Two times a day (BID) | ORAL | 0 refills | Status: AC

## 2024-08-19 NOTE — Discharge Instructions (Signed)

## 2024-08-19 NOTE — ED Provider Notes (Signed)
 [facility]    CSN: [id number] Arrival date & time: 08/19/24  0803      History   Chief Complaint Chief Complaint  Patient presents with   Facial Pain    HPI Roger Mcclain is a 73 y.o. male.   Pt presents today due to 4 days worth of sinus pressure and blood tinged nasal drainage. Pt states that he has been using a humidifier, simply saline, nasal spray, and vitamin C supplement. Pt states that he get something similar to this every year and states that the at home remedies are not effective for him.   The history is provided by the patient.    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 No pertinent surgical history.     Home Medications    Prior to Admission medications   Medication Sig Start Date End Date Taking? Authorizing Provider  allopurinol  (ZYLOPRIM ) 300 MG tablet Take 1 tablet daily to prevent Gout Attack 07/28/22  Yes Tonita Fallow, MD  amoxicillin -clavulanate (AUGMENTIN ) 875-125 MG tablet Take 1 tablet by mouth every 12 (twelve) hours. 08/19/24  Yes Andra Corean BROCKS, PA-C  Ascorbic Acid (VITAMIN C) 1000 MG tablet Take 1,000 mg by mouth daily.   Yes [provider]  eszopiclone (LUNESTA) 1 MG TABS tablet Take 1 mg by mouth at bedtime. 04/13/24  Yes [provider]   fenofibrate  micronized (LOFIBRA) 134 MG capsule TAKE 1 CAPSULE DAILY FOR TRIGLYCERIDES (BLOOD FATS) 03/09/22  Yes Wilkinson, Dana E, NP  gabapentin  (NEURONTIN ) 300 MG capsule TAKE  2 CAPSULES  AT BEDTIME FOR SLEEP 06/04/22  Yes Wilkinson, Dana E, NP  hydrOXYzine  (ATARAX ) 50 MG tablet TAKE  1 TO 2 TABLETS  1 OR 2 HOURS  BEFORE BEDTIME  AS NEEDED  FOR SLEEP 03/09/22  Yes Wilkinson, Dana E, NP  losartan -hydrochlorothiazide  (HYZAAR) 100-25 MG tablet TAKE 1 TABLET EVERY DAY FOR BLOOD PRESSURE 03/09/22  Yes Wilkinson, Dana E, NP  OZEMPIC, 0.25 OR 0.5 MG/DOSE, 2 MG/3ML SOPN  04/19/24  Yes [provider]  rosuvastatin  (CRESTOR ) 40 MG tablet TAKE 1 TABLET EVERY DAY FOR CHOLESTEROL 03/16/22  Yes Cranford, Tonya, NP  Vitamin D , Ergocalciferol , (DRISDOL) 1.25 MG (50000 UNIT) CAPS capsule Take 50,000 Units by mouth. Takes 1 capsule every other day   Yes [provider]  zinc gluconate 50 MG tablet Take 50 mg by mouth daily.   Yes [provider]  Accu-Chek Softclix Lancets lancets Use once daily with Accu-Chek meter to check BS reading. Dx. E11.9 01/15/22   Tonita Fallow, MD  albuterol  (VENTOLIN  HFA) 108 [phone] Base) MCG/ACT inhaler Inhale 2 puffs into the lungs every 6 (six) hours as needed for wheezing or shortness of breath. Patient not taking: Reported on 08/19/2024 10/12/22   Cranford, Tonya, NP  benzonatate  (TESSALON ) 100  MG capsule Take 1 capsule (100 mg total) by mouth every 8 (eight) hours as needed for cough. Patient not taking: Reported on 08/19/2024 10/12/22   Hazen Darryle BRAVO, FNP  Blood Glucose Monitoring Suppl (ACCU-CHEK AVIVA PLUS) w/Device KIT Use daily to check BS.  Dx. E11.9 01/15/22   Tonita Fallow, MD  diphenhydramine-acetaminophen (TYLENOL PM) 25-500 MG TABS tablet Take 1 tablet by mouth at bedtime as needed.    [provider]  empagliflozin  (JARDIANCE ) 10 MG TABS tablet Take 1 tablet (10 mg total) by mouth daily before breakfast. Patient not taking: Reported on  08/19/2024 11/03/22   Cranford, Tonya, NP  glucose blood test strip Use 3 x/day before Meals with Accu-Chek meter to check BS.   ( Dx:  e11.9 ) 03/17/22   Tonita Fallow, MD  Multiple Vitamin (MULTIVITAMIN) tablet Take 1 tablet by mouth daily.    [provider]  pioglitazone (ACTOS) 30 MG tablet Take 30 mg by mouth daily. 07/28/24   [provider]  promethazine -dextromethorphan (PROMETHAZINE -DM) 6.25-15 MG/5ML syrup Take 5 mLs by mouth 4 (four) times daily as needed for cough. Patient not taking: Reported on 08/19/2024 10/12/22   Cranford, Tonya, NP  tadalafil  (CIALIS ) 20 MG tablet Take 1/2 to 1 tablet every 2 to 3 days as needed for XXXX 10/02/22   Jude Lonell BRAVO, NP    Family History Family History  Problem Relation Age of Onset   Hypertension Mother    Heart disease Mother    Diabetes Father    Hyperlipidemia Father    Hypertension Father     Social History Social History   Tobacco Use   Smoking status: Never    Passive exposure: Never   Smokeless tobacco: Never  Vaping Use   Vaping status: Never Used  Substance Use Topics   Alcohol use: Yes   Drug use: No     Allergies   Morphine   Review of Systems Review of Systems   Physical Exam Triage Vital Signs ED Triage Vitals  Encounter Vitals Group     BP 08/19/24 0826 133/76     Girls Systolic BP Percentile --      Girls Diastolic BP Percentile --      Boys Systolic BP Percentile --      Boys Diastolic BP Percentile --      Pulse Rate 08/19/24 0826 76     Resp 08/19/24 0826 18     Temp 08/19/24 0826 98.1 F (36.7 C)     Temp Source 08/19/24 0826 Oral     SpO2 08/19/24 0826 96 %     Weight --      Height --      Head Circumference --      Peak Flow --      Pain Score 08/19/24 0823 3     Pain Loc --      Pain Education --      Exclude from Growth Chart --    No data found.  Updated Vital Signs BP 133/76 (BP Location: Left Arm)   Pulse 76   Temp 98.1 F (36.7 C) (Oral)   Resp 18    SpO2 96%   Visual Acuity Right Eye Distance:   Left Eye Distance:   Bilateral Distance:    Right Eye Near:   Left Eye Near:    Bilateral Near:     Physical Exam Vitals and nursing note reviewed.  Constitutional:      General: He is not in  acute distress.    Appearance: Normal appearance. He is not ill-appearing, toxic-appearing or diaphoretic.  HENT:     Nose: No congestion or rhinorrhea.     Right Sinus: Maxillary sinus tenderness and frontal sinus tenderness present.     Left Sinus: Maxillary sinus tenderness and frontal sinus tenderness present.     Comments: No tenderness to palpation    Mouth/Throat:     Mouth: Mucous membranes are moist.     Pharynx: Oropharynx is clear. No oropharyngeal exudate or posterior oropharyngeal erythema.  Eyes:     General: No scleral icterus. Cardiovascular:     Rate and Rhythm: Normal rate and regular rhythm.     Heart sounds: Normal heart sounds.  Pulmonary:     Effort: Pulmonary effort is normal. No respiratory distress.     Breath sounds: Normal breath sounds. No wheezing or rhonchi.  Skin:    General: Skin is warm.  Neurological:     Mental Status: He is alert and oriented to person, place, and time.  Psychiatric:        Mood and Affect: Mood normal.        Behavior: Behavior normal.      UC Treatments / Results  Labs (all labs ordered are listed, but only abnormal results are displayed) Labs Reviewed - No data to display  EKG   Radiology No results found.  Procedures Procedures (including critical care time)  Medications Ordered in UC Medications - No data to display  Initial Impression / Assessment and Plan / UC Course  I have reviewed the triage vital signs and the nursing notes.  Pertinent labs & imaging results that were available during my care of the patient were reviewed by me and considered in my medical decision making (see chart for details).    Final Clinical Impressions(s) / UC Diagnoses    Final diagnoses:  Acute pansinusitis, recurrence not specified     Discharge Instructions      You have been diagnosed with a sinus infection today, some are caused by viruses and others are caused by bacteria.  If your symptoms have been going on for less than 7 days it is most likely that you have a viral sinus infection.  Antibiotics will not work for this and it will have to run its course.  Sinus rinses (using a Nettie pot) or saline rinses are helpful as well as pseudoephedrine, and nasal sprays along with ibuprofen and Tylenol for pain.  If you have had your symptoms for more than 7 days you most likely have a bacterial infection and will be prescribed antibiotics.  Supportive measures given for viral sinus infections will also be helpful for bacterial infections.  If you are using antibiotics you should start to feel better in 2 to 3 days but it is important that you complete antibiotics in their entirety.     ED Prescriptions     Medication Sig Dispense Auth. Provider   amoxicillin -clavulanate (AUGMENTIN ) 875-125 MG tablet Take 1 tablet by mouth every 12 (twelve) hours. 14 tablet Andra Corean BROCKS, PA-C      PDMP not reviewed this encounter.   Andra Corean BROCKS, PA-C 08/19/24 [phone]

## 2024-08-19 NOTE — ED Triage Notes (Signed)
 Pt reports sinus pain over eyes x 4 days. Right nostril bleeds at times. Denies known fever. No cough. Voices concern for sinus infection. Taking airbourne
